# Patient Record
Sex: Female | Born: 1950
Health system: Southern US, Community
[De-identification: ages and names within clinical notes are randomized; demographics above are authoritative.]

## PROBLEM LIST (undated history)

## (undated) DIAGNOSIS — E039 Hypothyroidism, unspecified: Secondary | ICD-10-CM

## (undated) DIAGNOSIS — E785 Hyperlipidemia, unspecified: Secondary | ICD-10-CM

## (undated) DIAGNOSIS — K760 Fatty (change of) liver, not elsewhere classified: Secondary | ICD-10-CM

## (undated) DIAGNOSIS — I10 Essential (primary) hypertension: Secondary | ICD-10-CM

## (undated) DIAGNOSIS — Z780 Asymptomatic menopausal state: Secondary | ICD-10-CM

## (undated) DIAGNOSIS — M8580 Other specified disorders of bone density and structure, unspecified site: Secondary | ICD-10-CM

## (undated) DIAGNOSIS — K5790 Diverticulosis of intestine, part unspecified, without perforation or abscess without bleeding: Secondary | ICD-10-CM

## (undated) DIAGNOSIS — K635 Polyp of colon: Secondary | ICD-10-CM

## (undated) DIAGNOSIS — N6009 Solitary cyst of unspecified breast: Secondary | ICD-10-CM

## (undated) DIAGNOSIS — M1990 Unspecified osteoarthritis, unspecified site: Secondary | ICD-10-CM

## (undated) HISTORY — PX: COLONOSCOPY: SHX174

## (undated) HISTORY — DX: Solitary cyst of unspecified breast: N60.09

## (undated) HISTORY — DX: Hypothyroidism, unspecified: E03.9

## (undated) HISTORY — DX: Unspecified osteoarthritis, unspecified site: M19.90

## (undated) HISTORY — DX: Hyperlipidemia, unspecified: E78.5

## (undated) HISTORY — DX: Fatty (change of) liver, not elsewhere classified: K76.0

## (undated) HISTORY — DX: Asymptomatic menopausal state: Z78.0

## (undated) HISTORY — DX: Diverticulosis of intestine, part unspecified, without perforation or abscess without bleeding: K57.90

## (undated) HISTORY — DX: Polyp of colon: K63.5

## (undated) HISTORY — DX: Essential (primary) hypertension: I10

## (undated) HISTORY — DX: Other specified disorders of bone density and structure, unspecified site: M85.80

## (undated) HISTORY — PX: BREAST BIOPSY: SHX20

---

## 2000-04-11 DIAGNOSIS — Z780 Asymptomatic menopausal state: Secondary | ICD-10-CM

## 2000-04-11 HISTORY — DX: Asymptomatic menopausal state: Z78.0

## 2003-10-07 ENCOUNTER — Ambulatory Visit (HOSPITAL_COMMUNITY): Admission: RE | Admit: 2003-10-07 | Discharge: 2003-10-07 | Payer: Self-pay | Admitting: Internal Medicine

## 2003-10-07 ENCOUNTER — Encounter (INDEPENDENT_AMBULATORY_CARE_PROVIDER_SITE_OTHER): Payer: Self-pay | Admitting: Specialist

## 2004-10-07 ENCOUNTER — Other Ambulatory Visit: Admission: RE | Admit: 2004-10-07 | Discharge: 2004-10-07 | Payer: Self-pay | Admitting: Radiology

## 2004-11-16 ENCOUNTER — Ambulatory Visit (HOSPITAL_BASED_OUTPATIENT_CLINIC_OR_DEPARTMENT_OTHER): Admission: RE | Admit: 2004-11-16 | Discharge: 2004-11-16 | Payer: Self-pay | Admitting: General Surgery

## 2004-11-16 ENCOUNTER — Ambulatory Visit (HOSPITAL_COMMUNITY): Admission: RE | Admit: 2004-11-16 | Discharge: 2004-11-16 | Payer: Self-pay | Admitting: General Surgery

## 2004-11-16 ENCOUNTER — Encounter (INDEPENDENT_AMBULATORY_CARE_PROVIDER_SITE_OTHER): Payer: Self-pay | Admitting: *Deleted

## 2005-07-19 ENCOUNTER — Encounter: Admission: RE | Admit: 2005-07-19 | Discharge: 2005-07-19 | Payer: Self-pay | Admitting: Family Medicine

## 2005-09-27 ENCOUNTER — Ambulatory Visit: Payer: Self-pay | Admitting: Family Medicine

## 2006-01-03 ENCOUNTER — Ambulatory Visit: Payer: Self-pay | Admitting: Family Medicine

## 2006-02-28 ENCOUNTER — Ambulatory Visit: Payer: Self-pay | Admitting: Family Medicine

## 2006-08-29 ENCOUNTER — Ambulatory Visit: Payer: Self-pay | Admitting: Family Medicine

## 2007-05-04 ENCOUNTER — Ambulatory Visit: Payer: Self-pay | Admitting: Family Medicine

## 2007-05-04 ENCOUNTER — Other Ambulatory Visit: Admission: RE | Admit: 2007-05-04 | Discharge: 2007-05-04 | Payer: Self-pay | Admitting: Family Medicine

## 2007-06-19 ENCOUNTER — Ambulatory Visit: Payer: Self-pay | Admitting: Family Medicine

## 2007-09-07 ENCOUNTER — Ambulatory Visit: Payer: Self-pay | Admitting: Family Medicine

## 2008-01-09 ENCOUNTER — Ambulatory Visit: Payer: Self-pay | Admitting: Family Medicine

## 2008-01-17 ENCOUNTER — Ambulatory Visit: Payer: Self-pay | Admitting: Family Medicine

## 2008-05-06 ENCOUNTER — Ambulatory Visit: Payer: Self-pay | Admitting: Family Medicine

## 2008-10-06 LAB — HM COLONOSCOPY

## 2009-01-06 ENCOUNTER — Ambulatory Visit: Payer: Self-pay | Admitting: Family Medicine

## 2009-03-24 ENCOUNTER — Ambulatory Visit: Payer: Self-pay | Admitting: Family Medicine

## 2009-05-25 LAB — HM DEXA SCAN

## 2009-06-01 ENCOUNTER — Other Ambulatory Visit: Admission: RE | Admit: 2009-06-01 | Discharge: 2009-06-01 | Payer: Self-pay | Admitting: Family Medicine

## 2009-06-02 ENCOUNTER — Ambulatory Visit: Payer: Self-pay | Admitting: Family Medicine

## 2009-12-07 ENCOUNTER — Ambulatory Visit: Payer: Self-pay | Admitting: Physician Assistant

## 2010-02-23 ENCOUNTER — Ambulatory Visit: Payer: Self-pay | Admitting: Family Medicine

## 2010-06-09 ENCOUNTER — Encounter (INDEPENDENT_AMBULATORY_CARE_PROVIDER_SITE_OTHER): Payer: PRIVATE HEALTH INSURANCE | Admitting: Family Medicine

## 2010-06-09 DIAGNOSIS — I10 Essential (primary) hypertension: Secondary | ICD-10-CM

## 2010-06-09 DIAGNOSIS — E7800 Pure hypercholesterolemia, unspecified: Secondary | ICD-10-CM

## 2010-06-09 DIAGNOSIS — E039 Hypothyroidism, unspecified: Secondary | ICD-10-CM

## 2010-06-09 DIAGNOSIS — E119 Type 2 diabetes mellitus without complications: Secondary | ICD-10-CM

## 2010-08-27 NOTE — Op Note (Signed)
Sierra Combs, Sierra Combs                ACCOUNT NO.:  [id number]   MEDICAL RECORD NO.:  [id number]          PATIENT TYPE:  AMB   LOCATION:  DSC                          FACILITY:  [facility]   PHYSICIAN:  Rose Phi. Maple Hudson, M.D.   DATE OF BIRTH:  Jul 08, 1950   DATE OF PROCEDURE:  11/16/2004  DATE OF DISCHARGE:                                 OPERATIVE REPORT   PREOPERATIVE DIAGNOSIS:  Left breast mass.   POSTOPERATIVE DIAGNOSIS:  Left breast mass.   OPERATION:  Excision of same.   SURGEON:  Rose Phi. Maple Hudson, M.D.   ANESTHESIA:  MAC.   OPERATIVE PROCEDURE:  The patient was placed on the operating table with the  arms extended on the arm board and the left breast prepped and draped in the  usual fashion.  The palpable mass measuring about 2 cm in diameter was at  the 3 o'clock position near the areolar margin.  A curved incision was then  outlined over the palpable mass and the area infiltrated with a local  anesthetic.  Incision was made and a traction suture of 2-0 silk placed in  the mass itself and then it was excised.  Hemostasis was obtained with the  cautery.   Subcuticular closure of 4-0 Monocryl and Steri-Strips was carried out.  Dressing applied.  The patient then transferred to the recovery room in  satisfactory condition, having tolerated the procedure well.       PRY/MEDQ  D:  11/16/2004  T:  11/17/2004  Job:  [id number]

## 2010-08-27 NOTE — Op Note (Signed)
NAME:  Sierra Combs, Sierra Combs                          ACCOUNT NO.:  [id number]   MEDICAL RECORD NO.:  [id number]                   PATIENT TYPE:  AMB   LOCATION:  ENDO                                 FACILITY:  [facility]   PHYSICIAN:  Graylin Shiver, M.D.                DATE OF BIRTH:  [birth year]   DATE OF PROCEDURE:  10/07/2003  DATE OF DISCHARGE:                                 OPERATIVE REPORT   PROCEDURE:  Colonoscopy with biopsy.   INDICATIONS FOR PROCEDURE:  Screening.   Informed consent was obtained after explanation of the risks of bleeding,  infection and perforation.   PREMEDICATION:  Fentanyl 62.5 mcg IV, Versed 5 mg IV.   DESCRIPTION OF PROCEDURE:  With the patient in the left lateral decubitus  position, a rectal exam was performed and no masses were felt. The Olympus  colonoscope was inserted into the rectum and advanced around the colon to  the cecum. Cecal landmarks were identified. The cecum was normal. In the  proximal ascending colon, there was a 3 mm sessile polyp removed with cold  forceps. The transverse colon was normal. Of note is that there were a few  diverticula noted in the ascending colon region. The descending colon and  sigmoid revealed a few diverticula.  In the sigmoid, there was a small 3 mm  polyp removed with cold forceps. The rectum was normal. She tolerated the  procedure well without complications.   IMPRESSION:  1. Colon polyps, diagnosis code 211.3.  2. Scattered diverticula in the colon.   PLAN:  The pathology will be checked.                                               Graylin Shiver, M.D.    Germain Osgood  D:  10/07/2003  T:  10/07/2003  Job:  [id number]   cc:   Sharlot Gowda, M.D.  [address]  [place], [place] [address]  Fax: [phone]

## 2010-09-03 ENCOUNTER — Encounter: Payer: Self-pay | Admitting: *Deleted

## 2010-09-09 ENCOUNTER — Ambulatory Visit (INDEPENDENT_AMBULATORY_CARE_PROVIDER_SITE_OTHER): Payer: PRIVATE HEALTH INSURANCE | Admitting: Family Medicine

## 2010-09-09 ENCOUNTER — Encounter: Payer: Self-pay | Admitting: Family Medicine

## 2010-09-09 VITALS — BP 110/68 | HR 72 | Ht 64.0 in | Wt 122.0 lb

## 2010-09-09 DIAGNOSIS — I10 Essential (primary) hypertension: Secondary | ICD-10-CM | POA: Insufficient documentation

## 2010-09-09 DIAGNOSIS — E7800 Pure hypercholesterolemia, unspecified: Secondary | ICD-10-CM

## 2010-09-09 DIAGNOSIS — E119 Type 2 diabetes mellitus without complications: Secondary | ICD-10-CM | POA: Insufficient documentation

## 2010-09-09 DIAGNOSIS — E039 Hypothyroidism, unspecified: Secondary | ICD-10-CM

## 2010-09-09 HISTORY — DX: Hypothyroidism, unspecified: E03.9

## 2010-09-09 LAB — LIPID PANEL
Cholesterol: 157 mg/dL (ref 0–200)
LDL Cholesterol: 88 mg/dL (ref 0–99)
Total CHOL/HDL Ratio: 2.6 Ratio
Triglycerides: 41 mg/dL (ref <150)
VLDL: 8 mg/dL (ref 0–40)

## 2010-09-09 LAB — HEPATIC FUNCTION PANEL
ALT: 13 U/L (ref 0–35)
AST: 22 U/L (ref 0–37)
Albumin: 4 g/dL (ref 3.5–5.2)
Bilirubin, Direct: 0.1 mg/dL (ref 0.0–0.3)

## 2010-09-09 LAB — POCT GLYCOSYLATED HEMOGLOBIN (HGB A1C): Hemoglobin A1C: 6

## 2010-09-09 MED ORDER — LEVOTHYROXINE SODIUM 100 MCG PO TABS: 100.0000 ug | ORAL_TABLET | Freq: Every day | ORAL | Status: DC

## 2010-09-09 MED ORDER — ATORVASTATIN CALCIUM 80 MG PO TABS: 80.0000 mg | ORAL_TABLET | Freq: Every day | ORAL | Status: DC

## 2010-09-09 MED ORDER — NIACIN ER (ANTIHYPERLIPIDEMIC) 1000 MG PO TBCR: 1000.0000 mg | EXTENDED_RELEASE_TABLET | Freq: Every day | ORAL | Status: DC

## 2010-09-09 MED ORDER — METFORMIN HCL 1000 MG PO TABS: 1000.0000 mg | ORAL_TABLET | Freq: Two times a day (BID) | ORAL | Status: DC

## 2010-09-09 MED ORDER — LISINOPRIL 5 MG PO TABS: 5.0000 mg | ORAL_TABLET | Freq: Every day | ORAL | Status: DC

## 2010-09-09 NOTE — Progress Notes (Signed)
Addended by: Joselyn Arrow on: 09/09/2010 09:33 AM   Modules accepted: Orders

## 2010-09-09 NOTE — Progress Notes (Signed)
Subjective:    Patient ID: Sierra Combs, female    DOB: 06-08-1950, 60 y.o.   MRN: [id number]  HPI Diabetes follow-up:  A1c 3 months ago was 6.2.  Blood sugars at home are running <100 in the morning, 125-130 in the evenings.  Denies hypoglycemia.  Denies polydipsia and polyuria.  Last eye exam was 12/2009.  Patient follows a low sugar diet and checks feet regularly without concerns.  Hypertension follow-up:  Blood pressures are not checked elsewhere.  Denies dizziness, headaches, chest pain.  Denies side effects of medications.  Hyperlipidemia follow-up:  Patient is reportedly following a low-fat, low cholesterol diet.  Compliant with medications and denies medication side effects. Last LDL was 80 in November, due for re-check today.  Hypothyroidism follow-up:  Last TSH was 3 months ago and was normal.  Osteopenia:  Last DEXA 05/2009.  Has been on Fosamax for years ('08?). Denies dysphagia or other side effects of medication  Past Medical History  Diagnosis Date  . Hyperlipidemia small particles 6/06  . Breast cyst 3cm L breast  . Colonic polyp   . Diverticulosis   . Menopause 2002  . Osteopenia L hip  . Fatty liver   . Diabetes mellitus   . Hypertension   . Unspecified hypothyroidism 09/09/2010    Past Surgical History  Procedure Date  . Colonoscopy 6/05  . Breast biopsy 7/06 L breast benign    History   Social History  . Marital Status: Married    Spouse Name: N/A    Number of Children: 1  . 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 Smoking status: Never Smoker   . Smokeless tobacco: Never Used  . Alcohol Use: No  . Drug Use: No  . Sexually Active: Not on file   Other Topics Concern  . Not on file   Social History Narrative  . No narrative on file    Family History  Problem Relation Age of Onset  . Diabetes Mother   . Heart disease Mother   . Hyperlipidemia  Mother   . Hypertension Mother   . Diabetes Father   . Heart disease Father   . Hyperlipidemia Father   . Hypertension Father   . Diabetes Sister   . Diabetes Brother     legs amputated  . Cancer Neg Hx   . Diabetes Sister     Current outpatient prescriptions:alendronate (FOSAMAX) 70 MG tablet, Take 70 mg by mouth every 7 (seven) days. Take with a full glass of water on an empty stomach. , Disp: , Rfl: ;  aspirin 81 MG tablet, Take 81 mg by mouth daily.  , Disp: , Rfl: ;  atorvastatin (LIPITOR) 80 MG tablet, Take 1 tablet (80 mg total) by mouth daily., Disp: 30 tablet, Rfl: 5 Calcium Carbonate-Vitamin D (CALCIUM 600 + D PO), Take 1 tablet by mouth 2 (two) times daily.  , Disp: , Rfl: ;  fish oil-omega-3 fatty acids 1000 MG capsule, Take 1 g by mouth daily. , Disp: , Rfl: ;  levothyroxine (SYNTHROID, LEVOTHROID) 100 MCG tablet, Take 1 tablet (100 mcg total) by mouth daily., Disp: 30 tablet, Rfl: 5;  lisinopril (PRINIVIL,ZESTRIL) 5 MG tablet, Take 1 tablet (5 mg total) by mouth daily., Disp: 30 tablet, Rfl: 5 metFORMIN (GLUCOPHAGE) 1000 MG tablet, Take 1 tablet (1,000 mg total) by mouth 2 (two) times daily with a meal., Disp: 60 tablet, Rfl:  5;  niacin (NIASPAN) 1000 MG CR tablet, Take 1 tablet (1,000 mg total) by mouth at bedtime., Disp: 30 tablet, Rfl: 5;  DISCONTD: atorvastatin (LIPITOR) 80 MG tablet, Take 80 mg by mouth daily.  , Disp: , Rfl: ;  DISCONTD: calcium-vitamin D 250-100 MG-UNIT per tablet, Take 1 tablet by mouth daily. , Disp: , Rfl:  DISCONTD: levothyroxine (SYNTHROID, LEVOTHROID) 100 MCG tablet, Take 100 mcg by mouth daily.  , Disp: , Rfl: ;  DISCONTD: lisinopril (PRINIVIL,ZESTRIL) 5 MG tablet, Take 5 mg by mouth daily.  , Disp: , Rfl: ;  DISCONTD: metFORMIN (GLUCOPHAGE) 1000 MG tablet, Take 1,000 mg by mouth 2 (two) times daily with a meal.  , Disp: , Rfl: ;  DISCONTD: niacin (NIASPAN) 1000 MG CR tablet, Take 1,000 mg by mouth at bedtime.  , Disp: , Rfl:   No Known  Allergies  Review of Systems Denies headaches, chest pain, palpitations, dizziness, nausea, change in bowels, urinary complaints, skin rashes or other concerns    Objective:   Physical Exam  Well developed, well nourished patient, in no distress BP 110/68  Pulse 72  Ht 5\' 4"  (1.626 m)  Wt 122 lb (55.339 kg)  BMI 20.94 kg/m2 Neck: No lymphadenopathy or thyromegaly, no carotid bruit Heart:  Regular rate and rhythm, no murmurs, rubs, gallops or ectopy Lungs:  Clear bilaterally, without wheezes, rales or ronchi Abdomen:  Soft, nontender, nondistended, no hepatosplenomegaly or masses, normal bowel sounds Extremities:  No clubbing, cyanosis or edema, 2+ DP pulses, 1+ PT.  Neuro:  Alert and oriented x 3, cranial nerves grossly intact.  DTR's 2+ and symmetric.  Normal strength and sensation Back:  No spine or CVA tenderness Skin: no rashes or suspicious lesions Psych:  Normal mood, affect, hygiene and grooming, normal speech, eye contact      Assessment & Plan:   1. Type II or unspecified type diabetes mellitus without mention of complication, not stated as uncontrolled  POCT HgB A1C, Hemoglobin A1c, metFORMIN (GLUCOPHAGE) 1000 MG tablet  2. Unspecified hypothyroidism  levothyroxine (SYNTHROID, LEVOTHROID) 100 MCG tablet  3. Essential hypertension, benign  lisinopril (PRINIVIL,ZESTRIL) 5 MG tablet  4. Pure hypercholesterolemia  Lipid panel, Hepatic function panel, atorvastatin (LIPITOR) 80 MG tablet, niacin (NIASPAN) 1000 MG CR tablet  DEXA due 05/2010--will order at f/u visit in 6 months. F/U in 6 months, sooner if sugars or BP abnormal, or if develops thyroid-related symptoms  [employer]ayer Contour meter, box of 50 test strips and savings card given to patient.

## 2010-09-15 ENCOUNTER — Telehealth: Payer: Self-pay | Admitting: *Deleted

## 2010-09-15 NOTE — Telephone Encounter (Signed)
Message copied by Melonie Florida on Wed Sep 15, 2010 12:59 PM ------      Message from: KNAPP, EVE      Created: Mon Sep 13, 2010  5:19 PM       DM is well controlled.  LFT's are normal.  Goal LDL <70, last time was 80.  Currently, LDL is 88.  Continue Lipitor 80mg  and Niaspan, re-check lipids in 6 months.  Follow low cholesterol diet.  If LDL continues to rise, will need to change from Lipitor to Crestor

## 2010-09-15 NOTE — Telephone Encounter (Signed)
Left message giving patient lab results. Instructed her to call with any questions.

## 2010-09-20 ENCOUNTER — Telehealth: Payer: Self-pay | Admitting: *Deleted

## 2010-09-20 NOTE — Telephone Encounter (Signed)
Patient given lab results. She will work on low chol diet and sch lab visit for 6 months for fasting lipids.vs

## 2011-03-22 ENCOUNTER — Other Ambulatory Visit: Payer: PRIVATE HEALTH INSURANCE

## 2011-03-22 DIAGNOSIS — R7989 Other specified abnormal findings of blood chemistry: Secondary | ICD-10-CM

## 2011-03-22 DIAGNOSIS — E785 Hyperlipidemia, unspecified: Secondary | ICD-10-CM

## 2011-03-22 LAB — TSH: TSH: 0.858 u[IU]/mL (ref 0.350–4.500)

## 2011-03-22 LAB — HEMOGLOBIN A1C: Mean Plasma Glucose: 137 mg/dL — ABNORMAL HIGH (ref <117)

## 2011-03-23 LAB — LIPID PANEL
Cholesterol: 134 mg/dL (ref 0–200)
HDL: 56 mg/dL (ref >39)
Triglycerides: 31 mg/dL (ref <150)

## 2011-04-14 ENCOUNTER — Other Ambulatory Visit: Payer: Self-pay | Admitting: *Deleted

## 2011-04-14 DIAGNOSIS — E7800 Pure hypercholesterolemia, unspecified: Secondary | ICD-10-CM

## 2011-04-14 MED ORDER — ATORVASTATIN CALCIUM 80 MG PO TABS: 80.0000 mg | ORAL_TABLET | Freq: Every day | ORAL | Status: DC

## 2011-05-06 ENCOUNTER — Encounter: Payer: Self-pay | Admitting: Internal Medicine

## 2011-05-11 ENCOUNTER — Ambulatory Visit (INDEPENDENT_AMBULATORY_CARE_PROVIDER_SITE_OTHER): Payer: PRIVATE HEALTH INSURANCE | Admitting: Family Medicine

## 2011-05-11 ENCOUNTER — Encounter: Payer: Self-pay | Admitting: Family Medicine

## 2011-05-11 DIAGNOSIS — M949 Disorder of cartilage, unspecified: Secondary | ICD-10-CM

## 2011-05-11 DIAGNOSIS — E7800 Pure hypercholesterolemia, unspecified: Secondary | ICD-10-CM

## 2011-05-11 DIAGNOSIS — E119 Type 2 diabetes mellitus without complications: Secondary | ICD-10-CM

## 2011-05-11 DIAGNOSIS — M8580 Other specified disorders of bone density and structure, unspecified site: Secondary | ICD-10-CM

## 2011-05-11 DIAGNOSIS — I10 Essential (primary) hypertension: Secondary | ICD-10-CM

## 2011-05-11 DIAGNOSIS — R634 Abnormal weight loss: Secondary | ICD-10-CM

## 2011-05-11 DIAGNOSIS — E039 Hypothyroidism, unspecified: Secondary | ICD-10-CM

## 2011-05-11 MED ORDER — ATORVASTATIN CALCIUM 80 MG PO TABS: 80.0000 mg | ORAL_TABLET | Freq: Every day | ORAL | Status: DC

## 2011-05-11 MED ORDER — METFORMIN HCL 1000 MG PO TABS: 1000.0000 mg | ORAL_TABLET | Freq: Two times a day (BID) | ORAL | Status: DC

## 2011-05-11 MED ORDER — ALENDRONATE SODIUM 70 MG PO TABS: 70.0000 mg | ORAL_TABLET | ORAL | Status: DC

## 2011-05-11 MED ORDER — LEVOTHYROXINE SODIUM 100 MCG PO TABS: 100.0000 ug | ORAL_TABLET | Freq: Every day | ORAL | Status: DC

## 2011-05-11 MED ORDER — NIACIN ER (ANTIHYPERLIPIDEMIC) 1000 MG PO TBCR: 1000.0000 mg | EXTENDED_RELEASE_TABLET | Freq: Every day | ORAL | Status: DC

## 2011-05-11 MED ORDER — LISINOPRIL 5 MG PO TABS: 5.0000 mg | ORAL_TABLET | Freq: Every day | ORAL | Status: DC

## 2011-05-11 NOTE — Progress Notes (Signed)
Chief complaint:  6 month med check.  Had labs done in December.  Diabetes follow-up:  Blood sugars at home are running <100 in the morning, usually 80-90.  Denies hypoglycemia. Denies polydipsia and polyuria. Last eye exam was 03/2011, due to follow up in February for a cataract re-check. Patient follows a low sugar diet and checks feet regularly without concerns.   Hypertension follow-up: Blood pressures are not checked elsewhere. Denies dizziness, headaches, chest pain. Denies side effects of medications.   Hyperlipidemia follow-up: Patient is reportedly following a low-fat, low cholesterol diet. Compliant with medications and denies medication side effects.   She was noted to have lost 8 pounds since her last visit.  She thinks sometimes she just doesn't eat enough.  She eats 2 meals/day, with sometimes a third meal or a snack.  ROS:  R knee pain from arthritis.  Denies headaches, dizziness, chest pain, palpitations, fevers, URI symptoms, cough, shortness of breath, nausea, vomiting, diarrhea, bowel changes, skin rashes/lesions or other concerns  PHYSICAL EXAM: BP 118/84  Pulse 68  Ht 5\' 4"  (1.626 m)  Wt 114 lb (51.71 kg)  BMI 19.57 kg/m2 Well developed, pleasant, thin female in no distress HEENT: PERRL, EOMI, conjunctiva clear.  OP clear Neck: No lymphadenopathy or thyromegaly, no carotid bruit  Heart: Regular rate and rhythm, no murmurs, rubs, gallops or ectopy  Lungs: Clear bilaterally, without wheezes, rales or ronchi  Abdomen: Soft, nontender, nondistended, no hepatosplenomegaly or masses, normal bowel sounds  Extremities: No clubbing, cyanosis or edema, 2+ DP pulses, 1+ PT.  Neuro: Alert and oriented x 3, cranial nerves grossly intact. DTR's 2+ and symmetric. Normal strength and sensation  Back: No spine or CVA tenderness  Skin: no rashes or suspicious lesions  Psych: Normal mood, affect, hygiene and grooming, normal speech, eye contact Extremities: normal diabetic foot  exam--see other section  Lab Results  Component Value Date   HGBA1C 6.4* 03/22/2011   Lab Results  Component Value Date   ALT 13 09/09/2010   AST 22 09/09/2010   ALKPHOS 48 09/09/2010   BILITOT 0.4 09/09/2010   Lab Results  Component Value Date   TSH 0.858 03/22/2011   Lab Results  Component Value Date   CHOL 134 03/22/2011   HDL 56 03/22/2011   LDLCALC 72 03/22/2011   TRIG 31 03/22/2011   CHOLHDL 2.4 03/22/2011   ASSESSMENT/PLAN: 1. Essential hypertension, benign  lisinopril (PRINIVIL,ZESTRIL) 5 MG tablet  2. Pure hypercholesterolemia  niacin (NIASPAN) 1000 MG CR tablet, atorvastatin (LIPITOR) 80 MG tablet  3. Type II or unspecified type diabetes mellitus without mention of complication, not stated as uncontrolled  metFORMIN (GLUCOPHAGE) 1000 MG tablet  4. Unspecified hypothyroidism  levothyroxine (SYNTHROID, LEVOTHROID) 100 MCG tablet  5. Weight loss    6. Osteopenia  alendronate (FOSAMAX) 70 MG tablet   HTN, DM and lipids are controlled.  Weight loss--discussed differential diagnosis, including inadequate caloric intake, related to thyroid, possible underlying condition such as cancer.  Due for DEXA at [facility], and mammogram.  Encouraged mammograms yearly (last was 05/2009). Colonoscopy UTD 09/2008 Last pap 04/2007--due again now.  F/u 2-3 months for CPE/pap--will f/u on weight, and re-check TSH at that visit (nonfasting). Discuss Zostavax at CPE

## 2011-05-11 NOTE — Patient Instructions (Signed)
Schedule your mammogram (please get these yearly) You will be due for Bone density in February  Please try and eat more regularly--three meals a day, plus snacks  Pap smears are recommended every 2-3 years.  Schedule your appointment for 2-3 months, and at that time we will also recheck your weight and thyroid blood test.

## 2011-07-07 ENCOUNTER — Ambulatory Visit (INDEPENDENT_AMBULATORY_CARE_PROVIDER_SITE_OTHER): Payer: PRIVATE HEALTH INSURANCE | Admitting: Family Medicine

## 2011-07-07 ENCOUNTER — Other Ambulatory Visit (HOSPITAL_COMMUNITY)
Admission: RE | Admit: 2011-07-07 | Discharge: 2011-07-07 | Disposition: A | Discharge status: Home / Self Care | Payer: PRIVATE HEALTH INSURANCE | Source: Ambulatory Visit | Attending: Family Medicine | Admitting: Family Medicine

## 2011-07-07 ENCOUNTER — Encounter: Payer: Self-pay | Admitting: Family Medicine

## 2011-07-07 VITALS — BP 120/76 | HR 72 | Ht 64.0 in | Wt 115.0 lb

## 2011-07-07 DIAGNOSIS — Z0000 Encounter for general adult medical examination without abnormal findings: Secondary | ICD-10-CM

## 2011-07-07 DIAGNOSIS — R195 Other fecal abnormalities: Secondary | ICD-10-CM

## 2011-07-07 DIAGNOSIS — Z01419 Encounter for gynecological examination (general) (routine) without abnormal findings: Secondary | ICD-10-CM | POA: Insufficient documentation

## 2011-07-07 DIAGNOSIS — E039 Hypothyroidism, unspecified: Secondary | ICD-10-CM

## 2011-07-07 LAB — CBC WITH DIFFERENTIAL/PLATELET
Eosinophils Relative: 1 % (ref 0–5)
HCT: 39.3 % (ref 36.0–46.0)
Lymphocytes Relative: 36 % (ref 12–46)
Lymphs Abs: 2.8 10*3/uL (ref 0.7–4.0)
MCH: 29 pg (ref 26.0–34.0)
MCHC: 31.6 g/dL (ref 30.0–36.0)
MCV: 91.8 fL (ref 78.0–100.0)
Monocytes Absolute: 0.3 10*3/uL (ref 0.1–1.0)
Monocytes Relative: 4 % (ref 3–12)
Neutrophils Relative %: 58 % (ref 43–77)
Platelets: 343 10*3/uL (ref 150–400)
WBC: 7.8 10*3/uL (ref 4.0–10.5)

## 2011-07-07 LAB — HM PAP SMEAR: HM Pap smear: NORMAL

## 2011-07-07 NOTE — Progress Notes (Signed)
Sierra Combs is a 61 y.o. female who presents for a complete physical.  She has the following concerns:  She had recent med check, where weight loss was noted.  She is here for CPE/pap, and to follow up on her weight and have TSH re-checked as routine f/u on hypothyroidism.  She denies any thyroid symptoms.  Health Maintenance: Immunization History  Administered Date(s) Administered  . DTaP 05/04/2007  . Pneumococcal Conjugate 05/04/2007  . Td 06/17/1997  Gets flu shots yearly at work Last Pap smear: 1/09 Last mammogram: 05/2011 Last colonoscopy: 09/2008 Last DEXA: 05/2009 Dentist: a few years ago, plans to go Ophtho: UTD--having laser surgery next week Exercise:  Walks 15-20 minutes daily, plus does 4 flights of stairs up and down (15 minutes)  Past Medical History  Diagnosis Date  . Hyperlipidemia small particles 6/06  . Breast cyst 3cm L breast  . Colonic polyp   . Diverticulosis   . Menopause 2002  . Osteopenia L hip  . Fatty liver   . Diabetes mellitus   . Hypertension   . Unspecified hypothyroidism 09/09/2010    Past Surgical History  Procedure Date  . Colonoscopy 6/05  . Breast biopsy 7/06 L breast benign  . Cesarean section     History   Social History  . Marital Status: Married    Spouse Name: N/A    Number of Children: 1  . 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 Smoking status: Never Smoker   . Smokeless tobacco: Never Used  . Alcohol Use: No  . Drug Use: No  . Sexually Active: Yes -- Female partner(s)   Other Topics Concern  . Not on file   Social History Narrative  . No narrative on file    Family History  Problem Relation Age of Onset  . Diabetes Mother   . Heart disease Mother   . Hyperlipidemia Mother   . Hypertension Mother   . Diabetes Father   . Heart disease Father   . Hyperlipidemia Father   . Hypertension Father   . Diabetes Sister    . Diabetes Brother     legs amputated  . Cancer Neg Hx   . Diabetes Sister    Current Outpatient Prescriptions on File Prior to Visit  Medication Sig Dispense Refill  . alendronate (FOSAMAX) 70 MG tablet Take 1 tablet (70 mg total) by mouth every 7 (seven) days. Take with a full glass of water on an empty stomach.  4 tablet  5  . aspirin 81 MG tablet Take 81 mg by mouth daily.        Marland Kitchen atorvastatin (LIPITOR) 80 MG tablet Take 1 tablet (80 mg total) by mouth daily.  30 tablet  5  . Calcium Carbonate-Vitamin D (CALCIUM 600 + D PO) Take 1 tablet by mouth 2 (two) times daily.        . fish oil-omega-3 fatty acids 1000 MG capsule Take 1 g by mouth daily.       Marland Kitchen levothyroxine (SYNTHROID, LEVOTHROID) 100 MCG tablet Take 1 tablet (100 mcg total) by mouth daily.  30 tablet  5  . lisinopril (PRINIVIL,ZESTRIL) 5 MG tablet Take 1 tablet (5 mg total) by mouth daily.  30 tablet  5  . metFORMIN (GLUCOPHAGE) 1000 MG tablet Take 1 tablet (1,000 mg total) by mouth 2 (two) times daily with a meal.  60 tablet  5  .  niacin (NIASPAN) 1000 MG CR tablet Take 1 tablet (1,000 mg total) by mouth at bedtime.  30 tablet  5   No Known Allergies  ROS:  The patient denies anorexia, fever, weight changes, headaches,  vision changes, decreased hearing, ear pain, sore throat, breast concerns, chest pain, palpitations, dizziness, syncope, dyspnea on exertion, cough, swelling, nausea, vomiting, diarrhea, constipation, abdominal pain, melena, hematochezia, indigestion/heartburn, hematuria, incontinence, dysuria, vaginal bleeding, discharge, odor or itch, genital lesions, joint pains, numbness, tingling, weakness, tremor, suspicious skin lesions, depression, anxiety, abnormal bleeding/bruising, or enlarged lymph nodes. +mild allergies/congestion  PHYSICAL EXAM: BP 120/76  Pulse 72  Ht 5\' 4"  (1.626 m)  Wt 115 lb (52.164 kg)  BMI 19.74 kg/m2  General Appearance:    Alert, cooperative, no distress, appears stated age  Head:     Normocephalic, without obvious abnormality, atraumatic  Eyes:    PERRL, conjunctiva/corneas clear, EOM's intact, fundi    benign  Ears:    Normal TM's and external ear canals  Nose:   Nares normal, mucosa normal, no drainage or sinus   tenderness  Throat:   Lips, mucosa, and tongue normal; teeth and gums normal  Neck:   Supple, no lymphadenopathy;  thyroid:  no   enlargement/tenderness/nodules; no carotid   bruit or JVD  Back:    Spine nontender, no curvature, ROM normal, no CVA     tenderness  Lungs:     Clear to auscultation bilaterally without wheezes, rales or     ronchi; respirations unlabored  Chest Wall:    No tenderness or deformity   Heart:    Regular rate and rhythm, S1 and S2 normal, no murmur, rub   or gallop  Breast Exam:    No tenderness, masses, or nipple discharge or inversion.      No axillary lymphadenopathy. Some increased fibroglandularity on LUOQ compared to R.  Nontender, no discrete mass.  Abdomen:     Soft, non-tender, nondistended, normoactive bowel sounds,    no masses, no hepatosplenomegaly  Genitalia:    Normal external genitalia without lesions.  BUS and vagina normal; cervix nulliparous, without lesions, or cervical motion tenderness. Stenotic cervical os. No abnormal vaginal discharge.  Uterus and adnexa not enlarged, nontender, no masses.  Pap performed  Rectal:    Normal tone, no masses or tenderness; trace positive guaiac stool  Extremities:   No clubbing, cyanosis or edema  Pulses:   2+ and symmetric all extremities  Skin:   Skin color, texture, turgor normal, no rashes or lesions  Lymph nodes:   Cervical, supraclavicular, and axillary nodes normal  Neurologic:   CNII-XII intact, normal strength, sensation and gait; reflexes 2+ and symmetric throughout          Psych:   Normal mood, affect, hygiene and grooming.    ASSESSMENT/PLAN: 1. Routine general medical examination at a health care facility  Visual acuity screening, Cytology - PAP  2. Unspecified  hypothyroidism  TSH  3. Heme positive stool  CBC with Differential   Osteopenia--due for DEXA at [facility]. Rx given, patient to schedule  Discussed monthly self breast exams and yearly mammograms; at least 30 minutes of aerobic activity at least 5 days/week; proper sunscreen use reviewed; healthy diet, including goals of calcium and vitamin D intake and alcohol recommendations (less than or equal to 1 drink/day) reviewed; regular seatbelt use; changing batteries in smoke detectors.  Immunization recommendations discussed--UTD, recommended zostavax.  Colonoscopy recommendations reviewed--UTD   zostavax recommended--risks/side effects reviewed.  Pt to check insurance  coverage and either schedule nurse visit or give at next med check if desired.

## 2011-07-07 NOTE — Patient Instructions (Addendum)
HEALTH MAINTENANCE RECOMMENDATIONS:  It is recommended that you get at least 30 minutes of aerobic exercise at least 5 days/week (for weight loss, you may need as much as 60-90 minutes). This can be any activity that gets your heart rate up. This can be divided in 10-15 minute intervals if needed, but try and build up your endurance at least once a week.  Weight bearing exercise is also recommended twice weekly.  Eat a healthy diet with lots of vegetables, fruits and fiber.  "Colorful" foods have a lot of vitamins (ie green vegetables, tomatoes, red peppers, etc).  Limit sweet tea, regular sodas and alcoholic beverages, all of which has a lot of calories and sugar.  Up to 1 alcoholic drink daily may be beneficial for women (unless trying to lose weight, watch sugars).  Drink a lot of water.  Calcium recommendations are 1200-1500 mg daily (1500 mg for postmenopausal women or women without ovaries), and vitamin D 1000 IU daily.  This should be obtained from diet and/or supplements (vitamins), and calcium should not be taken all at once, but in divided doses.  Monthly self breast exams and yearly mammograms for women over the age of 85 is recommended.  Sunscreen of at least SPF 30 should be used on all sun-exposed parts of the skin when outside between the hours of 10 am and 4 pm (not just when at beach or pool, but even with exercise, golf, tennis, and yard work!)  Use a sunscreen that says "broad spectrum" so it covers both UVA and UVB rays, and make sure to reapply every 1-2 hours.  Remember to change the batteries in your smoke detectors when changing your clock times in the spring and fall.  Use your seat belt every time you are in a car, and please drive safely and not be distracted with cell phones and texting while driving.  Please check with your insurance regarding shingles vaccine (Zostavax).  If it is covered and affordable to you, schedule a nurse visit (or can do at your next office  visit)  Schedule dental cleanings every 6 months

## 2011-11-01 ENCOUNTER — Other Ambulatory Visit: Payer: Self-pay | Admitting: Family Medicine

## 2011-11-03 ENCOUNTER — Other Ambulatory Visit: Payer: Self-pay | Admitting: Family Medicine

## 2011-11-17 ENCOUNTER — Ambulatory Visit (INDEPENDENT_AMBULATORY_CARE_PROVIDER_SITE_OTHER): Payer: PRIVATE HEALTH INSURANCE | Admitting: Family Medicine

## 2011-11-17 ENCOUNTER — Encounter: Payer: Self-pay | Admitting: Family Medicine

## 2011-11-17 VITALS — BP 132/82 | HR 68 | Ht 64.0 in | Wt 111.0 lb

## 2011-11-17 DIAGNOSIS — E039 Hypothyroidism, unspecified: Secondary | ICD-10-CM

## 2011-11-17 DIAGNOSIS — E119 Type 2 diabetes mellitus without complications: Secondary | ICD-10-CM

## 2011-11-17 DIAGNOSIS — I10 Essential (primary) hypertension: Secondary | ICD-10-CM

## 2011-11-17 DIAGNOSIS — R634 Abnormal weight loss: Secondary | ICD-10-CM

## 2011-11-17 DIAGNOSIS — E7800 Pure hypercholesterolemia, unspecified: Secondary | ICD-10-CM

## 2011-11-17 LAB — POCT URINALYSIS DIPSTICK
Bilirubin, UA: NEGATIVE
Blood, UA: NEGATIVE
Glucose, UA: NEGATIVE
Ketones, UA: NEGATIVE
Leukocytes, UA: NEGATIVE
Nitrite, UA: NEGATIVE
pH, UA: 5

## 2011-11-17 MED ORDER — LISINOPRIL 5 MG PO TABS: 5.0000 mg | ORAL_TABLET | Freq: Every day | ORAL | Status: DC

## 2011-11-17 MED ORDER — METFORMIN HCL 1000 MG PO TABS: 1000.0000 mg | ORAL_TABLET | Freq: Two times a day (BID) | ORAL | Status: DC

## 2011-11-17 MED ORDER — NIACIN ER (ANTIHYPERLIPIDEMIC) 1000 MG PO TBCR: 1000.0000 mg | EXTENDED_RELEASE_TABLET | Freq: Every day | ORAL | Status: DC

## 2011-11-17 MED ORDER — ATORVASTATIN CALCIUM 80 MG PO TABS: 80.0000 mg | ORAL_TABLET | Freq: Every day | ORAL | Status: DC

## 2011-11-17 NOTE — Progress Notes (Signed)
Chief Complaint  Patient presents with  . Med check    fasting med check.   HPI: Diabetes follow-up: Blood sugars at home are running <100 in the morning, usually 80-90. Denies hypoglycemia. Denies polydipsia and polyuria. Last eye exam was a few months ago, going regularly. Patient follows a low sugar diet and checks feet regularly without concerns.   Hypertension follow-up: Blood pressures are not checked elsewhere. Denies dizziness, headaches, chest pain, edema. Denies side effects of medications.   Hyperlipidemia follow-up: Patient is reportedly following a low-fat, low cholesterol diet. Compliant with medications and denies medication side effects.   Hypothyroidism: Denies fatigue, hair, skin or bowel changes.  Lost 4# since her CPE in March.  She admits that she thinks maybe isn't eating enough.  Eats 2-3 meals/day, snacks on apple or Nabs, beans. Eats smaller portions than other people. She never returned her hemoccult cards  She still has to schedule her bone density  Past Medical History  Diagnosis Date  . Hyperlipidemia small particles 6/06  . Breast cyst 3cm L breast  . Colonic polyp   . Diverticulosis   . Menopause 2002  . Osteopenia L hip  . Fatty liver   . Diabetes mellitus   . Hypertension   . Unspecified hypothyroidism 09/09/2010   Past Surgical History  Procedure Date  . Colonoscopy 6/05  . Breast biopsy 7/06 L breast benign  . Cesarean section    History   Social History  . Marital Status: Married    Spouse Name: N/A    Number of Children: 1  . 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 Smoking status: Never Smoker   . Smokeless tobacco: Never Used  . Alcohol Use: No  . Drug Use: No  . Sexually Active: Yes -- Female partner(s)   Other Topics Concern  . Not on file   Social History Narrative  . No narrative on file   Family History  Problem Relation  Age of Onset  . Diabetes Mother   . Heart disease Mother   . Hyperlipidemia Mother   . Hypertension Mother   . Diabetes Father   . Heart disease Father   . Hyperlipidemia Father   . Hypertension Father   . Diabetes Sister   . Diabetes Brother     legs amputated  . Cancer Neg Hx   . Diabetes Sister    Current Outpatient Prescriptions on File Prior to Visit  Medication Sig Dispense Refill  . alendronate (FOSAMAX) 70 MG tablet take 1 tablet by mouth every week on an empty stomach with 6-8 oz of water. No food / meds for 30 min. Remain Upright  4 tablet  5  . aspirin 81 MG tablet Take 81 mg by mouth daily.        Marland Kitchen atorvastatin (LIPITOR) 80 MG tablet take 1 tablet by mouth once daily  30 tablet  0  . Calcium Carbonate-Vitamin D (CALCIUM 600 + D PO) Take 1 tablet by mouth 2 (two) times daily.        . fish oil-omega-3 fatty acids 1000 MG capsule Take 1 g by mouth daily.       Marland Kitchen levothyroxine (SYNTHROID, LEVOTHROID) 100 MCG tablet Take 1 tablet (100 mcg total) by mouth daily.  30 tablet  5  . lisinopril (PRINIVIL,ZESTRIL) 5 MG tablet Take 1 tablet (5 mg total) by mouth daily.  30 tablet  5  .  metFORMIN (GLUCOPHAGE) 1000 MG tablet Take 1 tablet (1,000 mg total) by mouth 2 (two) times daily with a meal.  60 tablet  5  . niacin (NIASPAN) 1000 MG CR tablet Take 1 tablet (1,000 mg total) by mouth at bedtime.  30 tablet  5   No Known Allergies  ROS: denies fevers, URI symptoms, headaches, dizziness, chest pain, cough, shortness of breath, early satiety, nausea, vomiting, heartburn, bowel changes, urinary complaints, vaginal discharge or bleeding, joint pains, skin rashes or other concerns.  See HPI  PHYSICAL EXAM: BP 132/82  Pulse 68  Ht 5\' 4"  (1.626 m)  Wt 111 lb (50.349 kg)  BMI 19.05 kg/m2 Pleasant thin female in no distress HEENT: PERRL, EOMI, conjunctiva clear. OP clear  Neck: No lymphadenopathy or thyromegaly, no carotid bruit  Heart: Regular rate and rhythm, no murmurs, rubs,  gallops or ectopy  Lungs: Clear bilaterally, without wheezes, rales or ronchi  Abdomen: Soft, nontender, nondistended, no hepatosplenomegaly or masses, normal bowel sounds  Extremities: No clubbing, cyanosis or edema, 2+ DP pulses, 1+ PT.  Neuro: Alert and oriented x 3, cranial nerves grossly intact. DTR's 2+ and symmetric. Normal strength and sensation  Back: No spine or CVA tenderness  Skin: no rashes or suspicious lesions  Psych: Normal mood, affect, hygiene and grooming, normal speech, eye contact  Extremities: no edema, normal pulses  Lab Results  Component Value Date   HGBA1C 5.6 11/17/2011   ASSESSMENT/PLAN: 1. Type II or unspecified type diabetes mellitus without mention of complication, not stated as uncontrolled  HgB A1c, Comprehensive metabolic panel, Microalbumin / creatinine urine ratio, Urinalysis Dipstick, metFORMIN (GLUCOPHAGE) 1000 MG tablet  2. Essential hypertension, benign  Comprehensive metabolic panel, Urinalysis Dipstick, lisinopril (PRINIVIL,ZESTRIL) 5 MG tablet  3. Pure hypercholesterolemia  Lipid panel, Comprehensive metabolic panel, niacin (NIASPAN) 1000 MG CR tablet, atorvastatin (LIPITOR) 80 MG tablet  4. Unspecified hypothyroidism  TSH  5. Unexplained weight loss  Comprehensive metabolic panel, TSH, Urinalysis Dipstick, DG Chest 2 View   Weight loss--?etiology.  Given ongoing weight losses, will check CXR, as well as labs.  She reports having seen nutritionist in past, but that was for diabetes.  We discussed at length reasons for seeing nutritionist again now, as there may be a component of inadequate intake as cause for her weight loss, vs an underlying malignancy.   Pt was finally agreeable to see nutritionist (very hesitant at first).   Check u/a since sample never given at CPE DM--well controlled.  Due for urine microalbumin HTN--well controlled Hypothyroidism--no symptoms, recheck TSH given weight loss Hyperlipidemia--due for labs  Gets flu shots at  work  25 minute visit more than 1/2 spent counseling--diet, importance of adequate caloric intake, and other possible etiologies of weight loss, and reasons for work-up. Needed to spent a lot of time convincing her of need to see nutritionist and eat more food.

## 2011-11-17 NOTE — Patient Instructions (Signed)
Please go to [facility] to get chest x-ray at your convenience (8:30-4) Please try and eat more food--you need to increase your caloric intake in order to prevent further weight loss.  I am going to try and refer you to a nutritionist to help you determine what your calorie goals are, and how to meet them with the foods that you like.  Continue all current medications, unless you hear from [place], based on your lab results.

## 2011-11-18 ENCOUNTER — Encounter: Payer: Self-pay | Admitting: Family Medicine

## 2011-11-18 LAB — LIPID PANEL
Cholesterol: 111 mg/dL (ref 0–200)
HDL: 44 mg/dL (ref >39)
LDL Cholesterol: 61 mg/dL (ref 0–99)
Triglycerides: 31 mg/dL (ref <150)

## 2011-11-18 LAB — COMPREHENSIVE METABOLIC PANEL
Alkaline Phosphatase: 49 U/L (ref 39–117)
BUN: 14 mg/dL (ref 6–23)
Calcium: 9.4 mg/dL (ref 8.4–10.5)
Chloride: 105 mEq/L (ref 96–112)
Glucose, Bld: 79 mg/dL (ref 70–99)
Sodium: 142 mEq/L (ref 135–145)
Total Bilirubin: 0.4 mg/dL (ref 0.3–1.2)
Total Protein: 6.3 g/dL (ref 6.0–8.3)

## 2011-11-18 MED ORDER — LEVOTHYROXINE SODIUM 100 MCG PO TABS: 100.0000 ug | ORAL_TABLET | Freq: Every day | ORAL | Status: DC

## 2011-11-29 ENCOUNTER — Encounter: Payer: Self-pay | Admitting: *Deleted

## 2011-11-29 ENCOUNTER — Encounter: Payer: PRIVATE HEALTH INSURANCE | Attending: Family Medicine | Admitting: *Deleted

## 2011-11-29 VITALS — Ht 64.0 in | Wt 112.6 lb

## 2011-11-29 DIAGNOSIS — Z713 Dietary counseling and surveillance: Secondary | ICD-10-CM | POA: Insufficient documentation

## 2011-11-29 DIAGNOSIS — R634 Abnormal weight loss: Secondary | ICD-10-CM | POA: Insufficient documentation

## 2011-11-29 NOTE — Patient Instructions (Signed)
Goals:  Follow Diabetes Meal Plan as instructed  Eat 3 meals and 2 snacks, every 3-5 hrs  Limit carbohydrate intake to 30-45 grams carbohydrate/meal  Limit carbohydrate intake to 15 grams carbohydrate/snack  Add lean protein foods to meals/snacks  Monitor glucose levels as instructed by your doctor  Continue active job  Continue to limit fat and cholesterol

## 2011-11-29 NOTE — Progress Notes (Signed)
  Medical Nutrition Therapy:  Appt start time: 1500 end time:  1600.   Assessment:  Primary concerns today: abnormal weight loss.   MEDICATIONS: see list   DIETARY INTAKE:  Usual eating pattern includes 3 meals and 3 snacks per day.  Everyday foods include lean proteins, starches, fruits, and vegetables.  Avoided foods include fatty foods, limits sugars.    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   Nutritional Diagnosis:  [place]-3.4 Unintentional weight gain As related to strict low fat, low carb diet and irregular eating pattern.  As evidenced by 10 lb weight loss in past year.    Intervention:  Nutrition counseling provided.  Nema is here because she's lost 10 pounds this past year.  She has diabetes and high cholesterol and was very nervous about eating foods she thought were bad for her so she never ate very much.  Recently she's been very busy and didn't stop and take the time to eat.  There have been days where she forgot to eat (and some days she forgot to take her medications).  Since her doctor's appointment on 8/8 she has been making a concerted effort to eat 3 meals/day and snacks.  She's gained 1.5 pounds since that visit 2 weeks ago.  She realizes how  important it is to fuel her body properly and she's making better choices.  Reviewed carb counting- ie what foods affect blood sugar so that she feels more comfortable making food choices, and reviewed heart-healthy eating.   Handouts given during visit include:  My meal plan card  Carb counting handout  Monitoring/Evaluation:  Dietary intake, exercise,  and body weight prn.

## 2011-12-13 ENCOUNTER — Ambulatory Visit
Admission: RE | Admit: 2011-12-13 | Discharge: 2011-12-13 | Disposition: A | Discharge status: Home / Self Care | Payer: PRIVATE HEALTH INSURANCE | Source: Ambulatory Visit | Attending: Family Medicine | Admitting: Family Medicine

## 2011-12-13 DIAGNOSIS — R634 Abnormal weight loss: Secondary | ICD-10-CM

## 2011-12-16 ENCOUNTER — Encounter: Payer: Self-pay | Admitting: *Deleted

## 2011-12-16 ENCOUNTER — Telehealth: Payer: Self-pay | Admitting: Family Medicine

## 2011-12-19 NOTE — Telephone Encounter (Signed)
Spoke with patient.

## 2012-02-08 ENCOUNTER — Encounter: Payer: Self-pay | Admitting: Family Medicine

## 2012-02-08 ENCOUNTER — Ambulatory Visit (INDEPENDENT_AMBULATORY_CARE_PROVIDER_SITE_OTHER): Payer: PRIVATE HEALTH INSURANCE | Admitting: Family Medicine

## 2012-02-08 VITALS — BP 110/78 | HR 76 | Ht 63.25 in | Wt 113.0 lb

## 2012-02-08 DIAGNOSIS — R634 Abnormal weight loss: Secondary | ICD-10-CM

## 2012-02-08 NOTE — Progress Notes (Signed)
Chief Complaint  Patient presents with  . Follow-up    2 month follow-up on weight   HPI:  Patient presents for follow up on her weight, as she had been losing weight.  Labs, thyroid and CXR were normal, and it was felt to be due to inadequate caloric intake. She saw the nutritionist since last visit, and had already gained some weight between our visit and her visit with nutritionist..  She works until 9pm, and wasn't eating enough at night.  She has increased her food intake in the evenings--eating sandwich now, rather than just snacking.   Feels well, denies problems.  Denies any headaches, dizziness, swelling.  Sugars are running 80-100. Doesn't check BP's elsewhere.  Denies any side effects from medications.  Getting flu shot at work soon.  Past Medical History  Diagnosis Date  . Hyperlipidemia small particles 6/06  . Breast cyst 3cm L breast  . Colonic polyp   . Diverticulosis   . Menopause 2002  . Osteopenia L hip  . Fatty liver   . Diabetes mellitus   . Hypertension   . Unspecified hypothyroidism 09/09/2010   Past Surgical History  Procedure Date  . Colonoscopy 6/05  . Breast biopsy 7/06 L breast benign  . Cesarean section    History   Social History  . Marital Status: Married    Spouse Name: N/A    Number of Children: 1  . 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 Smoking status: Never Smoker   . Smokeless tobacco: Never Used  . Alcohol Use: No  . Drug Use: No  . Sexually Active: Yes -- Female partner(s)   Other Topics Concern  . Not on file   Social History Narrative  . No narrative on file    Current outpatient prescriptions:alendronate (FOSAMAX) 70 MG tablet, take 1 tablet by mouth every week on an empty stomach with 6-8 oz of water. No food / meds for 30 min. Remain Upright, Disp: 4 tablet, Rfl: 5;  aspirin 81 MG tablet, Take 81 mg by mouth daily.  , Disp: ,  Rfl: ;  atorvastatin (LIPITOR) 80 MG tablet, Take 1 tablet (80 mg total) by mouth daily., Disp: 30 tablet, Rfl: 5 Calcium Carbonate-Vitamin D (CALCIUM 600 + D PO), Take 1 tablet by mouth 2 (two) times daily.  , Disp: , Rfl: ;  fish oil-omega-3 fatty acids 1000 MG capsule, Take 1 g by mouth daily. , Disp: , Rfl: ;  levothyroxine (SYNTHROID, LEVOTHROID) 100 MCG tablet, Take 1 tablet (100 mcg total) by mouth daily., Disp: 30 tablet, Rfl: 5;  lisinopril (PRINIVIL,ZESTRIL) 5 MG tablet, Take 1 tablet (5 mg total) by mouth daily., Disp: 30 tablet, Rfl: 5 metFORMIN (GLUCOPHAGE) 1000 MG tablet, Take 1 tablet (1,000 mg total) by mouth 2 (two) times daily with a meal., Disp: 60 tablet, Rfl: 5;  niacin (NIASPAN) 1000 MG CR tablet, Take 1 tablet (1,000 mg total) by mouth at bedtime., Disp: 30 tablet, Rfl: 5  No Known Allergies  ROS: Occasional sneezing.  Denies fevers, URI symptoms, cough, shortness of breath, chest pain, skin rash, nausea, vomiting, bowel changes (occasional diarrhea with certain foods).  PHYSICAL EXAM: BP 110/78  Pulse 76  Ht 5' 3.25" (1.607 m)  Wt 113 lb (51.256 kg)  BMI 19.86 kg/m2  Well developed, pleasant female in no distress Neck: no lymphadenopathy, thyromegaly or mass Heart: regular rate and rhythm Lungs:  clear Skin: no rash Psych: normal mood, affect, hygiene and grooming  ASSESSMENT/PLAN: 1. Unexplained weight loss    Weight stable/improved since increasing her intake. DM and HTN are well controlled.  Return for med check in February TSH, A1c, lipid, lft, glucose at visit (prefers not to come for labs prior)

## 2012-02-08 NOTE — Patient Instructions (Addendum)
Keep up the good work.  Ensure that you are eating adequate calories, and getting 5-7 servings of fruits and vegetables daily.  If eating small meals, have frequent snacks between.  Your weight was good today.  Continue all the same medications, and we will see you in February for your bloodwork and visit.

## 2012-04-30 ENCOUNTER — Other Ambulatory Visit: Payer: Self-pay | Admitting: Family Medicine

## 2012-04-30 NOTE — Telephone Encounter (Signed)
i THINK SHE IS YOUR PT IS THIS OK

## 2012-04-30 NOTE — Telephone Encounter (Signed)
Left message for pt to call me back 

## 2012-04-30 NOTE — Telephone Encounter (Signed)
Talked with pt she said she did not have dexa nor the mammo and asked if we would referr her I told her I would and I would send in 2 mth of meds and she has an apt 05/14/12 med check sent request to [facility]

## 2012-04-30 NOTE — Telephone Encounter (Signed)
She was due to have her DEXA done at [facility] in 06/2011.  I don't see any results in computer, don't know if she had study done.  Please call pt, and if not done, then schedule for her, and okay to refill until that appt.  Also, due for med check in Feb (see last OV from October), and it doesn't appear that she has this scheduled. Also looks like her last CPE was 06/2011, and she doesn't have another scheduled

## 2012-05-14 ENCOUNTER — Ambulatory Visit (INDEPENDENT_AMBULATORY_CARE_PROVIDER_SITE_OTHER): Payer: PRIVATE HEALTH INSURANCE | Admitting: Family Medicine

## 2012-05-14 ENCOUNTER — Encounter: Payer: Self-pay | Admitting: Family Medicine

## 2012-05-14 VITALS — BP 122/80 | HR 72 | Ht 63.25 in | Wt 111.0 lb

## 2012-05-14 DIAGNOSIS — E7800 Pure hypercholesterolemia, unspecified: Secondary | ICD-10-CM

## 2012-05-14 DIAGNOSIS — M8580 Other specified disorders of bone density and structure, unspecified site: Secondary | ICD-10-CM

## 2012-05-14 DIAGNOSIS — I10 Essential (primary) hypertension: Secondary | ICD-10-CM

## 2012-05-14 DIAGNOSIS — E039 Hypothyroidism, unspecified: Secondary | ICD-10-CM

## 2012-05-14 DIAGNOSIS — R5383 Other fatigue: Secondary | ICD-10-CM

## 2012-05-14 DIAGNOSIS — M949 Disorder of cartilage, unspecified: Secondary | ICD-10-CM

## 2012-05-14 DIAGNOSIS — E119 Type 2 diabetes mellitus without complications: Secondary | ICD-10-CM

## 2012-05-14 DIAGNOSIS — R5381 Other malaise: Secondary | ICD-10-CM

## 2012-05-14 LAB — CBC WITH DIFFERENTIAL/PLATELET
Basophils Relative: 0 % (ref 0–1)
Eosinophils Relative: 0 % (ref 0–5)
HCT: 36 % (ref 36.0–46.0)
Hemoglobin: 12.2 g/dL (ref 12.0–15.0)
Lymphocytes Relative: 18 % (ref 12–46)
MCHC: 33.9 g/dL (ref 30.0–36.0)
MCV: 84.7 fL (ref 78.0–100.0)
Monocytes Absolute: 0.3 10*3/uL (ref 0.1–1.0)
Monocytes Relative: 3 % (ref 3–12)
Neutro Abs: 6.2 10*3/uL (ref 1.7–7.7)
RDW: 14.6 % (ref 11.5–15.5)

## 2012-05-14 MED ORDER — LISINOPRIL 5 MG PO TABS: 5.0000 mg | ORAL_TABLET | Freq: Every day | ORAL | Status: DC

## 2012-05-14 MED ORDER — METFORMIN HCL 1000 MG PO TABS: 1000.0000 mg | ORAL_TABLET | Freq: Two times a day (BID) | ORAL | Status: DC

## 2012-05-14 NOTE — Patient Instructions (Signed)
You have lost a little weight again.  Please make sure that you are eating 3 meals + 2 snacks (at least) daily.  I do not want you losing further weight.  Once your labs are back, I will send in refills for thyroid and cholesterol meds.  If any changes are made, you will be called.  If all is normal, you should get letter in the mail, and meds will be refilled for 6 months.

## 2012-05-14 NOTE — Progress Notes (Signed)
Chief Complaint  Patient presents with  . Diabetes    fasting med check.   Patient presents for fasting med check with no concerns.  She was feeling a little weak due to fasting, so blood was drawn prior to visit so she could eat something.  Feeling better, but still a little fatigued.  Diabetes follow-up:  Blood sugars at home are running 85-90's (always <100).  Denies hypoglycemia.  Denies polydipsia and polyuria.  Last eye exam was in September, and in December.  Patient follows a low sugar diet and checks feet regularly without concerns.  Hypertension follow-up: Blood pressures are not checked elsewhere. Denies dizziness, headaches, chest pain, edema. Denies side effects of medications.   Hyperlipidemia follow-up: Patient is reportedly following a low-fat, low cholesterol diet. Compliant with medications and denies medication side effects.   Hypothyroidism: Denies fatigue, hair, skin or bowel changes.   Weight loss--wasn't aware that she lost more, but knows that she wasn't eating enough.  She hasn't been snacking in the evening like she had been.  Osteopenia: Has appt for DEXA 2/21.  Denies dysphagia, jaw pain.  Past Medical History  Diagnosis Date  . Hyperlipidemia small particles 6/06  . Breast cyst 3cm L breast  . Colonic polyp   . Diverticulosis   . Menopause 2002  . Osteopenia L hip  . Fatty liver   . Diabetes mellitus   . Hypertension   . Unspecified hypothyroidism 09/09/2010   Past Surgical History  Procedure Date  . Colonoscopy 6/05  . Breast biopsy 7/06 L breast benign  . Cesarean section    History   Social History  . Marital Status: Married    Spouse Name: N/A    Number of Children: 1  . 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 Smoking status: Never Smoker   . Smokeless tobacco: Never Used  . Alcohol Use: No  . Drug Use: No  . Sexually Active: Yes -- Female  partner(s)   Other Topics Concern  . Not on file   Social History Narrative  . No narrative on file   Current Outpatient Prescriptions on File Prior to Visit  Medication Sig Dispense Refill  . alendronate (FOSAMAX) 70 MG tablet take 1 tablet by mouth every week on an empty stomach with 6-8 oz of water. No food / meds for 30 min. Remain Upright  4 tablet  1  . aspirin 81 MG tablet Take 81 mg by mouth daily.        Marland Kitchen atorvastatin (LIPITOR) 80 MG tablet Take 1 tablet (80 mg total) by mouth daily.  30 tablet  5  . Calcium Carbonate-Vitamin D (CALCIUM 600 + D PO) Take 1 tablet by mouth 2 (two) times daily.        . fish oil-omega-3 fatty acids 1000 MG capsule Take 1 g by mouth daily.       Marland Kitchen levothyroxine (SYNTHROID, LEVOTHROID) 100 MCG tablet Take 1 tablet (100 mcg total) by mouth daily.  30 tablet  5  . lisinopril (PRINIVIL,ZESTRIL) 5 MG tablet Take 1 tablet (5 mg total) by mouth daily.  30 tablet  5  . metFORMIN (GLUCOPHAGE) 1000 MG tablet Take 1 tablet (1,000 mg total) by mouth 2 (two) times daily with a meal.  60 tablet  5  . niacin (NIASPAN) 1000 MG CR tablet Take 1 tablet (1,000 mg total) by mouth at bedtime.  30  tablet  5   No Known Allergies  ROS:  Denies fevers, URI symptoms.  Slight sinus problem occasionally.  Denies chest pain, shortness of breath, palpitations, nausea, vomiting, diarrhea, bleeding/bruising. Skin rash.  Denies bowel changes, hair/skin changes.  Denies depression. "most of the time I'm pretty good"--occasional fatigue  PHYSICAL EXAM: BP 122/80  Pulse 72  Ht 5' 3.25" (1.607 m)  Wt 111 lb (50.349 kg)  BMI 19.51 kg/m2 Somewhat tired-appearing female, in no distress Neck: no lymphadenopathy, thyromegaly or mass Heart: regular rate and rhythm without murmur Lungs: clear bilaterally Back: no CVA or spinal tenderness Abdomen: soft, nontender, no organomegaly or mass Extremities: no edema Skin: no rash Psych: normal mood, affect, hygiene and grooming.  Appears  somewhat tired.  Full range of affect.  Lab Results  Component Value Date   HGBA1C 5.9 05/14/2012   ASSESSMENT/PLAN: 1. Type II or unspecified type diabetes mellitus without mention of complication, not stated as uncontrolled  HgB A1c, Comprehensive metabolic panel, metFORMIN (GLUCOPHAGE) 1000 MG tablet  2. Essential hypertension, benign  Comprehensive metabolic panel, lisinopril (PRINIVIL,ZESTRIL) 5 MG tablet  3. Pure hypercholesterolemia  Lipid panel, Comprehensive metabolic panel  4. Unspecified hypothyroidism  TSH  5. Osteopenia  Vitamin D 25 hydroxy  6. Other malaise and fatigue  Vitamin D 25 hydroxy, CBC with Differential   Weight loss--admits to eating less; has been stable in the past when making a point of eating more food at each meal, and more snacks.  Encouraged adequate po intake.  She declines 3 month weight check, but promises to be better about eating. DM---well controlled HTN--controlled Lipids and thyroid--labs ordered.  Refill based on results.  F/u 6 months for CPE/med check, sooner prn  ADDENDUM: Lab Results  Component Value Date   TSH 0.353 05/14/2012   Dose decreased from to given borderline low TSH and weight loss.  Recheck TSH 2 months

## 2012-05-15 LAB — COMPREHENSIVE METABOLIC PANEL
AST: 21 U/L (ref 0–37)
Albumin: 4.2 g/dL (ref 3.5–5.2)
Alkaline Phosphatase: 52 U/L (ref 39–117)
Glucose, Bld: 115 mg/dL — ABNORMAL HIGH (ref 70–99)
Potassium: 4.3 mEq/L (ref 3.5–5.3)
Sodium: 142 mEq/L (ref 135–145)
Total Bilirubin: 0.4 mg/dL (ref 0.3–1.2)
Total Protein: 7 g/dL (ref 6.0–8.3)

## 2012-05-15 LAB — LIPID PANEL
HDL: 61 mg/dL (ref >39)
LDL Cholesterol: 66 mg/dL (ref 0–99)
Total CHOL/HDL Ratio: 2.2 Ratio
VLDL: 8 mg/dL (ref 0–40)

## 2012-05-15 LAB — VITAMIN D 25 HYDROXY (VIT D DEFICIENCY, FRACTURES): Vit D, 25-Hydroxy: 55 ng/mL (ref 30–89)

## 2012-05-15 MED ORDER — ATORVASTATIN CALCIUM 80 MG PO TABS: 80.0000 mg | ORAL_TABLET | Freq: Every day | ORAL | Status: DC

## 2012-05-15 MED ORDER — LEVOTHYROXINE SODIUM 88 MCG PO TABS: 88.0000 ug | ORAL_TABLET | Freq: Every day | ORAL | Status: DC

## 2012-05-15 NOTE — Progress Notes (Signed)
Quick Note:  Advise pt--chem panel is normal (except fasting sugar 115), liver/kidney normal. Lipids are at goal. Thyroid appears to be borderline over-replaced, which can contribute to weight loss. I'd like to cut back on her dose from to , and recheck TSH in 2 months (244.9, nonfasting lab visit). Rx was sent to pharmacy for new thyroid dose, and cholesterol med. ______

## 2012-05-28 ENCOUNTER — Other Ambulatory Visit: Payer: Self-pay | Admitting: *Deleted

## 2012-05-28 DIAGNOSIS — E039 Hypothyroidism, unspecified: Secondary | ICD-10-CM

## 2012-05-30 ENCOUNTER — Other Ambulatory Visit: Payer: Self-pay | Admitting: Family Medicine

## 2012-06-01 LAB — HM MAMMOGRAPHY: HM Mammogram: NORMAL

## 2012-06-08 ENCOUNTER — Encounter: Payer: Self-pay | Admitting: Family Medicine

## 2012-06-28 ENCOUNTER — Telehealth: Payer: Self-pay | Admitting: *Deleted

## 2012-06-28 NOTE — Telephone Encounter (Signed)
Checked patient's chart re:how long she has been on Fosamax. She was started on it 06/19/2007. She has 6 weeks of the medication left and would like to finish taking what she has left and then she will stop per your recommendation and recheck DXA in 2 years. Just an FYI. Thanks.

## 2012-07-01 ENCOUNTER — Other Ambulatory Visit: Payer: Self-pay | Admitting: Family Medicine

## 2012-07-03 ENCOUNTER — Other Ambulatory Visit: Payer: Self-pay | Admitting: Family Medicine

## 2012-08-07 ENCOUNTER — Other Ambulatory Visit: Payer: PRIVATE HEALTH INSURANCE

## 2012-08-07 DIAGNOSIS — E039 Hypothyroidism, unspecified: Secondary | ICD-10-CM

## 2012-11-28 ENCOUNTER — Other Ambulatory Visit: Payer: Self-pay | Admitting: Family Medicine

## 2012-12-17 ENCOUNTER — Encounter: Payer: Self-pay | Admitting: Family Medicine

## 2012-12-17 ENCOUNTER — Ambulatory Visit (INDEPENDENT_AMBULATORY_CARE_PROVIDER_SITE_OTHER): Payer: PRIVATE HEALTH INSURANCE | Admitting: Family Medicine

## 2012-12-17 VITALS — BP 120/72 | HR 76 | Ht 63.0 in | Wt 111.0 lb

## 2012-12-17 DIAGNOSIS — Z23 Encounter for immunization: Secondary | ICD-10-CM

## 2012-12-17 DIAGNOSIS — E039 Hypothyroidism, unspecified: Secondary | ICD-10-CM

## 2012-12-17 DIAGNOSIS — E7800 Pure hypercholesterolemia, unspecified: Secondary | ICD-10-CM

## 2012-12-17 DIAGNOSIS — Z0000 Encounter for general adult medical examination without abnormal findings: Secondary | ICD-10-CM

## 2012-12-17 DIAGNOSIS — I10 Essential (primary) hypertension: Secondary | ICD-10-CM

## 2012-12-17 DIAGNOSIS — E119 Type 2 diabetes mellitus without complications: Secondary | ICD-10-CM

## 2012-12-17 LAB — POCT URINALYSIS DIPSTICK
Ketones, UA: NEGATIVE
Leukocytes, UA: NEGATIVE
Nitrite, UA: NEGATIVE
Protein, UA: NEGATIVE
pH, UA: 5

## 2012-12-17 LAB — COMPREHENSIVE METABOLIC PANEL
ALT: 13 U/L (ref 0–35)
AST: 24 U/L (ref 0–37)
Albumin: 4.3 g/dL (ref 3.5–5.2)
BUN: 13 mg/dL (ref 6–23)
Calcium: 9.8 mg/dL (ref 8.4–10.5)
Chloride: 103 mEq/L (ref 96–112)
Potassium: 4.3 mEq/L (ref 3.5–5.3)

## 2012-12-17 LAB — LIPID PANEL
Cholesterol: 136 mg/dL (ref 0–200)
VLDL: 8 mg/dL (ref 0–40)

## 2012-12-17 LAB — HEMOCCULT GUIAC POC 1CARD (OFFICE)

## 2012-12-17 MED ORDER — METFORMIN HCL 1000 MG PO TABS: 1000.0000 mg | ORAL_TABLET | Freq: Two times a day (BID) | ORAL | Status: DC

## 2012-12-17 MED ORDER — LISINOPRIL 5 MG PO TABS: ORAL_TABLET | ORAL | Status: DC

## 2012-12-17 MED ORDER — ATORVASTATIN CALCIUM 80 MG PO TABS: ORAL_TABLET | ORAL | Status: DC

## 2012-12-17 MED ORDER — LEVOTHYROXINE SODIUM 88 MCG PO TABS: 88.0000 ug | ORAL_TABLET | Freq: Every day | ORAL | Status: DC

## 2012-12-17 MED ORDER — NIACIN ER (ANTIHYPERLIPIDEMIC) 1000 MG PO TBCR: EXTENDED_RELEASE_TABLET | ORAL | Status: DC

## 2012-12-17 NOTE — Progress Notes (Signed)
Chief Complaint  Patient presents with  . Annual Exam    fasting annual exam with pelvic. Patient is scheduled for diabetic eye exam this Wed with Dr.Hecker. No concerns today, just needs meds refilled. Patient states she will get flu shot at work.    Sierra Combs is a 62 y.o. female who presents for a complete physical.  She is also here for fasting med check.  She has no specific complaints or concerns.  Diabetes follow-up: Blood sugars at home are running 77-90's (always <100). Denies hypoglycemia. Denies polydipsia and polyuria. Eye exam is scheduled for later this week. Patient follows a low sugar diet and checks feet regularly without concerns.   Hypertension follow-up: Blood pressures are not checked elsewhere. Denies dizziness, headaches, chest pain, edema. Denies side effects of medications.   Hyperlipidemia follow-up: Patient is reportedly following a low-fat, low cholesterol diet. Compliant with medications and denies medication side effects.  Hypothyroidism: Denies fatigue, hair, skin or bowel changes.   Weight loss--denies any significant change.  Her appetite is good; she reports being active 24/7.  She is no longer eating in the dining room, but is packing lunch. She thinks she gained weight when she ate in the dining room, and that her current weight is normal for her.  Review of chart shows her weight is the same as this time last year.  Osteopenia:  She had DEXA in 05/2012, which showed T-1.3.  She had been on Fosamax for many years (>5), so it was stopped after that DEXA.  Plan to recheck in 2 years.  Immunization History  Administered Date(s) Administered  . Influenza Split 02/10/2012  . Pneumococcal Conjugate 05/04/2007  . Td 06/17/1997  . Tdap 05/04/2007, 12/17/2012   Gets flu shots yearly at work  Last Pap smear: 06/2011  Last mammogram: 05/2012  Last colonoscopy: 09/2008  Last DEXA: 05/2012 Dentist: a few years ago, plans to go ("on her list") Ophtho: UTD,  scheduled for this week Exercise: Walks 15-20 minutes daily, plus does 4 flights of stairs up and down (15 minutes).  Other than walking, not getting weight-bearing exercise (rare).  Past Medical History  Diagnosis Date  . Hyperlipidemia small particles 6/06  . Breast cyst 3cm L breast  . Colonic polyp   . Diverticulosis   . Menopause 2002  . Osteopenia L hip  . Fatty liver   . Diabetes mellitus   . Hypertension   . Unspecified hypothyroidism 09/09/2010    Past Surgical History  Procedure Laterality Date  . Colonoscopy  6/05, 09/2008  . Breast biopsy  7/06 L breast benign  . Cesarean section      History   Social History  . Marital Status: Married    Spouse Name: N/A    Number of Children: 1  . 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 Smoking status: Never Smoker   . Smokeless tobacco: Never Used  . Alcohol Use: No  . Drug Use: No  . Sexual Activity: Yes    Partners: Male   Other Topics Concern  . Not on file   Social History Narrative   Lives with husband, no pets.  Daughter lives in [place]    Family History  Problem Relation Age of Onset  . Diabetes Mother   . Heart disease Mother   . Hyperlipidemia Mother   . Hypertension Mother   . Diabetes Father   .  Heart disease Father   . Hyperlipidemia Father   . Hypertension Father   . Diabetes Sister   . Diabetes Brother     legs amputated  . Cancer Neg Hx   . Diabetes Sister     Current outpatient prescriptions:aspirin 81 MG tablet, Take 81 mg by mouth daily.  , Disp: , Rfl: ;  atorvastatin (LIPITOR) 80 MG tablet, take 1 tablet by mouth once daily, Disp: 30 tablet, Rfl: 11;  Calcium Carbonate-Vitamin D (CALCIUM 600 + D PO), Take 1 tablet by mouth 2 (two) times daily.  , Disp: , Rfl: ;  fish oil-omega-3 fatty acids 1000 MG capsule, Take 1 g by mouth daily. , Disp: , Rfl:  levothyroxine (SYNTHROID, LEVOTHROID)  88 MCG tablet, Take 1 tablet (88 mcg total) by mouth daily., Disp: 30 tablet, Rfl: 11;  lisinopril (PRINIVIL,ZESTRIL) 5 MG tablet, take 1 tablet by mouth once daily, Disp: 30 tablet, Rfl: 5;  metFORMIN (GLUCOPHAGE) 1000 MG tablet, Take 1 tablet (1,000 mg total) by mouth 2 (two) times daily with a meal., Disp: 60 tablet, Rfl: 5 niacin (NIASPAN) 1000 MG CR tablet, take 1 tablet by mouth at bedtime, Disp: 30 tablet, Rfl: 11  No Known Allergies  ROS: The patient denies anorexia, fever, weight changes, headaches, vision changes, decreased hearing, ear pain, sore throat, breast concerns, chest pain, palpitations, dizziness, syncope, dyspnea on exertion, cough, swelling, nausea, vomiting, diarrhea, constipation, abdominal pain, melena, hematochezia, indigestion/heartburn, hematuria, incontinence, dysuria, vaginal bleeding, discharge, odor or itch, genital lesions, numbness, tingling, weakness, tremor, suspicious skin lesions, depression, anxiety, abnormal bleeding/bruising, or enlarged lymph nodes.  +mild allergies/congestion intermittently Some vaginal dryness Occasional knee pain, worse when wearing certain shoes   PHYSICAL EXAM:  BP 120/72  Pulse 76  Ht 5\' 3"  (1.6 m)  Wt 111 lb (50.349 kg)  BMI 19.67 kg/m2  General Appearance:  Alert, cooperative, no distress, appears stated age   Head:  Normocephalic, without obvious abnormality, atraumatic   Eyes:  PERRL, conjunctiva/corneas clear, EOM's intact, fundi  benign   Ears:  Normal TM's and external ear canals   Nose:  Nares normal, mucosa mildly edematous with clear mucus, no drainage or sinus tenderness   Throat:  Lips, mucosa, and tongue normal; teeth in poor repair with some decay   Neck:  Supple, no lymphadenopathy; thyroid: no enlargement/tenderness/nodules; no carotid  bruit or JVD   Back:  Spine nontender, no curvature, ROM normal, no CVA tenderness   Lungs:  Clear to auscultation bilaterally without wheezes, rales or ronchi; respirations  unlabored   Chest Wall:  No tenderness or deformity   Heart:  Regular rate and rhythm, S1 and S2 normal, no murmur, rub  or gallop   Breast Exam:  No tenderness, masses, or nipple discharge or inversion. No axillary lymphadenopathy. Some increased fibroglandularity on LUOQ compared to R. Nontender, no discrete mass.   Abdomen:  Soft, non-tender, nondistended, normoactive bowel sounds,  no masses, no hepatosplenomegaly   Genitalia:  Normal external genitalia without lesions. BUS and vagina normal; no cervical motion tenderness. No abnormal vaginal discharge. Uterus and adnexa not enlarged, nontender, no masses. Pap not performed   Rectal:  Normal tone, no masses or tenderness; trace positive guaiac stool   Extremities:  No clubbing, cyanosis or edema   Pulses:  2+ and symmetric all extremities   Skin:  Skin color, texture, turgor normal, no rashes or lesions   Lymph nodes:  Cervical, supraclavicular, and axillary nodes normal   Neurologic:  CNII-XII intact, normal  strength, sensation and gait; reflexes 2+ and symmetric throughout   Psych: Normal mood, affect, hygiene and grooming.   Lab Results  Component Value Date   HGBA1C 5.9 12/17/2012   ASSESSMENT/PLAN:  Routine general medical examination at a health care facility - Plan: Visual acuity screening, POCT Urinalysis Dipstick  Type II or unspecified type diabetes mellitus without mention of complication, not stated as uncontrolled - well controlled - Plan: HgB A1c, Comprehensive metabolic panel, Microalbumin / creatinine urine ratio, metFORMIN (GLUCOPHAGE) 1000 MG tablet, HM Diabetes Foot Exam  Pure hypercholesterolemia - check labs today.  Has been very well controlled.  will only check lipids yearly, LFTs q6 mos (check today) - Plan: Lipid panel, Comprehensive metabolic panel, niacin (NIASPAN) 1000 MG CR tablet, atorvastatin (LIPITOR) 80 MG tablet  Essential hypertension, benign - periodically check BP elsewhere; borderline at first,  normal on repeat - Plan: Comprehensive metabolic panel, lisinopril (PRINIVIL,ZESTRIL) 5 MG tablet  Unspecified hypothyroidism - euthyroid by history.  Check TSH today; if normal, just yearly - Plan: TSH, levothyroxine (SYNTHROID, LEVOTHROID) 88 MCG tablet  Need for Tdap vaccination - Plan: Tdap vaccine greater than or equal to 7yo IM   Discussed monthly self breast exams and yearly mammograms; at least 30 minutes of aerobic activity at least 5 days/week; proper sunscreen use reviewed; healthy diet, including goals of calcium and vitamin D intake and alcohol recommendations (less than or equal to 1 drink/day) reviewed; regular seatbelt use; changing batteries in smoke detectors. Immunization recommendations discussed--UTD (will get flu shot at work), recommended zostavax. Colonoscopy recommendations reviewed--UTD   zostavax recommended--risks/side effects reviewed. Pt to check insurance coverage and either schedule nurse visit or give at next med check if desired.  Pt given Tdap again today, as there was a miscommunication.  F/u 6 months for med check, sooner prn.

## 2012-12-17 NOTE — Patient Instructions (Addendum)
HEALTH MAINTENANCE RECOMMENDATIONS:  It is recommended that you get at least 30 minutes of aerobic exercise at least 5 days/week (for weight loss, you may need as much as 60-90 minutes). This can be any activity that gets your heart rate up. This can be divided in 10-15 minute intervals if needed, but try and build up your endurance at least once a week.  Weight bearing exercise is also recommended twice weekly.  Eat a healthy diet with lots of vegetables, fruits and fiber.  "Colorful" foods have a lot of vitamins (ie green vegetables, tomatoes, red peppers, etc).  Limit sweet tea, regular sodas and alcoholic beverages, all of which has a lot of calories and sugar.  Up to 1 alcoholic drink daily may be beneficial for women (unless trying to lose weight, watch sugars).  Drink a lot of water.  Calcium recommendations are 1200-1500 mg daily (1500 mg for postmenopausal women or women without ovaries), and vitamin D 1000 IU daily.  This should be obtained from diet and/or supplements (vitamins), and calcium should not be taken all at once, but in divided doses.  Monthly self breast exams and yearly mammograms for women over the age of 32 is recommended.  Sunscreen of at least SPF 30 should be used on all sun-exposed parts of the skin when outside between the hours of 10 am and 4 pm (not just when at beach or pool, but even with exercise, golf, tennis, and yard work!)  Use a sunscreen that says "broad spectrum" so it covers both UVA and UVB rays, and make sure to reapply every 1-2 hours.  Remember to change the batteries in your smoke detectors when changing your clock times in the spring and fall.  Use your seat belt every time you are in a car, and please drive safely and not be distracted with cell phones and texting while driving.  Check insurance coverage and either schedule nurse visit or give at next med check if desired. Please schedule appointment with dentist.

## 2012-12-18 LAB — MICROALBUMIN / CREATININE URINE RATIO: Creatinine, Urine: 41.7 mg/dL

## 2012-12-31 ENCOUNTER — Other Ambulatory Visit: Payer: Self-pay | Admitting: Family Medicine

## 2013-01-02 ENCOUNTER — Encounter: Payer: Self-pay | Admitting: *Deleted

## 2013-01-04 ENCOUNTER — Other Ambulatory Visit: Payer: Self-pay | Admitting: *Deleted

## 2013-01-04 DIAGNOSIS — E039 Hypothyroidism, unspecified: Secondary | ICD-10-CM

## 2013-01-04 MED ORDER — LEVOTHYROXINE SODIUM 75 MCG PO TABS: 75.0000 ug | ORAL_TABLET | Freq: Every day | ORAL | Status: DC

## 2013-02-26 ENCOUNTER — Other Ambulatory Visit: Payer: Self-pay

## 2013-03-01 ENCOUNTER — Other Ambulatory Visit: Payer: PRIVATE HEALTH INSURANCE

## 2013-03-01 DIAGNOSIS — E039 Hypothyroidism, unspecified: Secondary | ICD-10-CM

## 2013-03-02 LAB — TSH: TSH: 4.51 u[IU]/mL — ABNORMAL HIGH (ref 0.350–4.500)

## 2013-03-04 ENCOUNTER — Telehealth: Payer: Self-pay | Admitting: Family Medicine

## 2013-03-04 ENCOUNTER — Other Ambulatory Visit: Payer: Self-pay | Admitting: *Deleted

## 2013-03-04 DIAGNOSIS — E039 Hypothyroidism, unspecified: Secondary | ICD-10-CM

## 2013-03-04 MED ORDER — LEVOTHYROXINE SODIUM 75 MCG PO TABS: 75.0000 ug | ORAL_TABLET | Freq: Every day | ORAL | Status: DC

## 2013-03-05 NOTE — Telephone Encounter (Signed)
Done

## 2013-06-17 ENCOUNTER — Encounter: Payer: Self-pay | Admitting: Family Medicine

## 2013-06-17 ENCOUNTER — Ambulatory Visit (INDEPENDENT_AMBULATORY_CARE_PROVIDER_SITE_OTHER): Payer: PRIVATE HEALTH INSURANCE | Admitting: Family Medicine

## 2013-06-17 VITALS — BP 110/76 | HR 68 | Ht 63.0 in | Wt 115.0 lb

## 2013-06-17 DIAGNOSIS — E119 Type 2 diabetes mellitus without complications: Secondary | ICD-10-CM

## 2013-06-17 DIAGNOSIS — I10 Essential (primary) hypertension: Secondary | ICD-10-CM

## 2013-06-17 DIAGNOSIS — E7800 Pure hypercholesterolemia, unspecified: Secondary | ICD-10-CM

## 2013-06-17 DIAGNOSIS — E039 Hypothyroidism, unspecified: Secondary | ICD-10-CM

## 2013-06-17 LAB — COMPREHENSIVE METABOLIC PANEL
ALT: 13 U/L (ref 0–35)
AST: 22 U/L (ref 0–37)
Albumin: 3.8 g/dL (ref 3.5–5.2)
Alkaline Phosphatase: 47 U/L (ref 39–117)
BUN: 12 mg/dL (ref 6–23)
CALCIUM: 9.1 mg/dL (ref 8.4–10.5)
CHLORIDE: 103 meq/L (ref 96–112)
CO2: 31 meq/L (ref 19–32)
CREATININE: 0.62 mg/dL (ref 0.50–1.10)
GLUCOSE: 85 mg/dL (ref 70–99)
Potassium: 4.5 mEq/L (ref 3.5–5.3)
Sodium: 141 mEq/L (ref 135–145)
TOTAL PROTEIN: 6.6 g/dL (ref 6.0–8.3)
Total Bilirubin: 0.4 mg/dL (ref 0.2–1.2)

## 2013-06-17 LAB — LIPID PANEL
Cholesterol: 131 mg/dL (ref 0–200)
HDL: 54 mg/dL (ref >39)
LDL CALC: 71 mg/dL (ref 0–99)
Total CHOL/HDL Ratio: 2.4 Ratio
Triglycerides: 30 mg/dL (ref <150)
VLDL: 6 mg/dL (ref 0–40)

## 2013-06-17 LAB — TSH: TSH: 3.975 u[IU]/mL (ref 0.350–4.500)

## 2013-06-17 LAB — POCT GLYCOSYLATED HEMOGLOBIN (HGB A1C): Hemoglobin A1C: 5.9

## 2013-06-17 MED ORDER — LISINOPRIL 5 MG PO TABS: ORAL_TABLET | ORAL | Status: DC

## 2013-06-17 MED ORDER — METFORMIN HCL 1000 MG PO TABS: 1000.0000 mg | ORAL_TABLET | Freq: Two times a day (BID) | ORAL | Status: DC

## 2013-06-17 NOTE — Patient Instructions (Signed)
Continue your current medications. Your diabetes and blood pressure are well controlled.  We will contact you with your results soon--and refill your thyroid medication once labs are reviewed.  It is best for your health to avoid significant passive tobacco exposure (second hand smoke). Encourage your husband to quit smoking, or have him smoke outside.  Available resources to help him quit include free counseling through [facility] ([place]Quitline.com or 1-800-QUITNOW), smoking cessation classes through [facility] (call to find out schedule), over-the-counter nicotine replacements, and e-cigarettes (although this may not help break the hand-mouth habit).  Many insurance companies also have smoking cessation programs (which may decrease the cost of patches, meds if enrolled).

## 2013-06-17 NOTE — Progress Notes (Signed)
Chief Complaint  Patient presents with  . Diabetes    fasting med check.    Patient presents for 6 month check without any specific complaints.  Hypothyroidism:  Last TSH in November was borderline on the 84mg dose (previously was over-replaced on 88 mcg).  Denies change in energy, bowel, skin or hair changes. Due for recheck today.  Diabetes follow-up: Blood sugars at home are running around 80 in the mornings (checks q3d).  She only rarely checks it in the evenings, runs 125-130.  Denies hypoglycemia. Denies polydipsia and polyuria. Eye exams twice yearly, in September and December, doing well. Patient follows a low sugar diet and checks feet regularly without concerns.   Hypertension follow-up: Blood pressures are not checked elsewhere. Denies dizziness, headaches, chest pain, edema. Denies side effects of medications.   Hyperlipidemia follow-up: Patient is reportedly following a low-fat, low cholesterol diet. Compliant with medications and denies medication side effects.   Weight loss--Weight is stable, up 4 pounds from her last visit. Her appetite is good; she reports being active 24/7. She is no longer eating in the dining room, but is packing lunch. She thinks she gained weight when she ate in the dining room, and that her current weight is normal for her.  Past Medical History  Diagnosis Date  . Hyperlipidemia small particles 6/06  . Breast cyst 3cm L breast  . Colonic polyp   . Diverticulosis   . Menopause 2002  . Osteopenia L hip  . Fatty liver   . Diabetes mellitus   . Hypertension   . Unspecified hypothyroidism 09/09/2010   Past Surgical History  Procedure Laterality Date  . Colonoscopy  6/05, 09/2008  . Breast biopsy  7/06 L breast benign  . Cesarean section     History   Social History  . Marital Status: Married    Spouse Name: N/A    Number of Children: 1  . 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 Smoking status: Never Smoker   . Smokeless tobacco: Never Used  . Alcohol Use: No  . Drug Use: No  . Sexual Activity: Yes    Partners: Male   Other Topics Concern  . Not on file   Social History Narrative   Lives with husband, no pets.  Daughter lives in [place]  +passive tobacco exposure from her husband (and smokes inside)   Outpatient Encounter Prescriptions as of 06/17/2013  Medication Sig  . aspirin 81 MG tablet Take 81 mg by mouth daily.    .Marland Kitchenatorvastatin (LIPITOR) 80 MG tablet take 1 tablet by mouth once daily  . Calcium Carbonate-Vitamin D (CALCIUM 600 + D PO) Take 1 tablet by mouth 2 (two) times daily.    . fish oil-omega-3 fatty acids 1000 MG capsule Take 1 g by mouth daily.   .Marland Kitchenlevothyroxine (SYNTHROID, LEVOTHROID) 75 MCG tablet Take 1 tablet (75 mcg total) by mouth daily before breakfast.  . lisinopril (PRINIVIL,ZESTRIL) 5 MG tablet take 1 tablet by mouth once daily  . metFORMIN (GLUCOPHAGE) 1000 MG tablet Take 1 tablet (1,000 mg total) by mouth 2 (two) times daily with a meal.  . niacin (NIASPAN) 1000 MG CR tablet take 1 tablet by mouth at bedtime   ROS:  Denies fevers, chills, URI symptoms, cough, shortness of breath, headaches, dizziness, chest pain, palpitations, nausea, vomiting, bowel changes, urinary complaints, vaginal discharge, bleeding, skin lesions/rashes.  Moods are good.  Weight is stable.  PHYSICAL EXAM: BP 110/76  Pulse 68  Ht _0  (1.6 m)  Wt 115 lb (52.164 kg)  BMI 20.38 kg/m2 Well developed, pleasant female in no distress Neck: no lymphadenopathy, thyromegaly or mass  Heart: regular rate and rhythm without murmur  Lungs: clear bilaterally  Back: no CVA or spinal tenderness  Abdomen: soft, nontender, no organomegaly or mass  Extremities: no edema  Skin: no rash  Psych: normal mood, affect, hygiene and grooming.   A1c 5.9  ASSESSMENT/PLAN:  Type II or unspecified type diabetes mellitus  without mention of complication, not stated as uncontrolled - well controlled - Plan: HgB A1c, Comprehensive metabolic panel, HM Diabetes Foot Exam, lisinopril (PRINIVIL,ZESTRIL) 5 MG tablet, metFORMIN (GLUCOPHAGE) 1000 MG tablet  Essential hypertension, benign - controlled - Plan: Comprehensive metabolic panel, lisinopril (PRINIVIL,ZESTRIL) 5 MG tablet  Pure hypercholesterolemia - Plan: Lipid panel, Comprehensive metabolic panel  Unspecified hypothyroidism - Plan: TSH   c-met, lipids, TSH  6 mos--CPE/med check  Refill thyroid med after TSH back

## 2013-06-18 ENCOUNTER — Encounter: Payer: Self-pay | Admitting: Family Medicine

## 2013-06-18 MED ORDER — LEVOTHYROXINE SODIUM 75 MCG PO TABS: 75.0000 ug | ORAL_TABLET | Freq: Every day | ORAL | Status: DC

## 2013-06-18 NOTE — Addendum Note (Signed)
Addended by: Rita Ohara on: 06/18/2013 08:35 AM   Modules accepted: Orders

## 2013-11-15 LAB — HM COLONOSCOPY

## 2013-12-04 ENCOUNTER — Encounter: Payer: Self-pay | Admitting: *Deleted

## 2013-12-10 LAB — HM MAMMOGRAPHY

## 2013-12-17 ENCOUNTER — Encounter: Payer: Self-pay | Admitting: Family Medicine

## 2013-12-17 ENCOUNTER — Encounter: Payer: Self-pay | Admitting: Internal Medicine

## 2013-12-23 LAB — HM DIABETES FOOT EXAM

## 2013-12-26 ENCOUNTER — Encounter: Payer: Self-pay | Admitting: *Deleted

## 2013-12-30 ENCOUNTER — Encounter: Payer: Self-pay | Admitting: Family Medicine

## 2013-12-30 ENCOUNTER — Ambulatory Visit (INDEPENDENT_AMBULATORY_CARE_PROVIDER_SITE_OTHER): Payer: PRIVATE HEALTH INSURANCE | Admitting: Family Medicine

## 2013-12-30 VITALS — BP 124/80 | HR 80 | Ht 63.5 in | Wt 118.0 lb

## 2013-12-30 DIAGNOSIS — I10 Essential (primary) hypertension: Secondary | ICD-10-CM

## 2013-12-30 DIAGNOSIS — M949 Disorder of cartilage, unspecified: Secondary | ICD-10-CM

## 2013-12-30 DIAGNOSIS — M899 Disorder of bone, unspecified: Secondary | ICD-10-CM

## 2013-12-30 DIAGNOSIS — Z79899 Other long term (current) drug therapy: Secondary | ICD-10-CM

## 2013-12-30 DIAGNOSIS — M8580 Other specified disorders of bone density and structure, unspecified site: Secondary | ICD-10-CM | POA: Insufficient documentation

## 2013-12-30 DIAGNOSIS — Z0000 Encounter for general adult medical examination without abnormal findings: Secondary | ICD-10-CM

## 2013-12-30 DIAGNOSIS — E119 Type 2 diabetes mellitus without complications: Secondary | ICD-10-CM

## 2013-12-30 DIAGNOSIS — E039 Hypothyroidism, unspecified: Secondary | ICD-10-CM

## 2013-12-30 DIAGNOSIS — E7800 Pure hypercholesterolemia, unspecified: Secondary | ICD-10-CM

## 2013-12-30 DIAGNOSIS — Z2911 Encounter for prophylactic immunotherapy for respiratory syncytial virus (RSV): Secondary | ICD-10-CM

## 2013-12-30 DIAGNOSIS — Z23 Encounter for immunization: Secondary | ICD-10-CM

## 2013-12-30 LAB — CBC WITH DIFFERENTIAL/PLATELET
BASOS ABS: 0 10*3/uL (ref 0.0–0.1)
BASOS PCT: 0 % (ref 0–1)
EOS ABS: 0.1 10*3/uL (ref 0.0–0.7)
EOS PCT: 2 % (ref 0–5)
HCT: 36.1 % (ref 36.0–46.0)
Hemoglobin: 12.2 g/dL (ref 12.0–15.0)
Lymphocytes Relative: 25 % (ref 12–46)
Lymphs Abs: 1.9 10*3/uL (ref 0.7–4.0)
MCH: 29.7 pg (ref 26.0–34.0)
MCHC: 33.8 g/dL (ref 30.0–36.0)
MCV: 87.8 fL (ref 78.0–100.0)
Monocytes Absolute: 0.4 10*3/uL (ref 0.1–1.0)
Monocytes Relative: 5 % (ref 3–12)
Neutro Abs: 5 10*3/uL (ref 1.7–7.7)
Neutrophils Relative %: 68 % (ref 43–77)
PLATELETS: 312 10*3/uL (ref 150–400)
RBC: 4.11 MIL/uL (ref 3.87–5.11)
RDW: 14.9 % (ref 11.5–15.5)
WBC: 7.4 10*3/uL (ref 4.0–10.5)

## 2013-12-30 LAB — POCT URINALYSIS DIPSTICK
BILIRUBIN UA: NEGATIVE
Blood, UA: NEGATIVE
Glucose, UA: NEGATIVE
KETONES UA: NEGATIVE
Leukocytes, UA: NEGATIVE
Nitrite, UA: NEGATIVE
Protein, UA: NEGATIVE
SPEC GRAV UA: 1.02
Urobilinogen, UA: NEGATIVE
pH, UA: 5

## 2013-12-30 LAB — POCT GLYCOSYLATED HEMOGLOBIN (HGB A1C): Hemoglobin A1C: 5.8

## 2013-12-30 MED ORDER — METFORMIN HCL 1000 MG PO TABS: 1000.0000 mg | ORAL_TABLET | Freq: Two times a day (BID) | ORAL | Status: DC

## 2013-12-30 MED ORDER — ATORVASTATIN CALCIUM 80 MG PO TABS: ORAL_TABLET | ORAL | Status: DC

## 2013-12-30 MED ORDER — NIACIN ER (ANTIHYPERLIPIDEMIC) 1000 MG PO TBCR: EXTENDED_RELEASE_TABLET | ORAL | Status: DC

## 2013-12-30 MED ORDER — LISINOPRIL 5 MG PO TABS: ORAL_TABLET | ORAL | Status: DC

## 2013-12-30 NOTE — Patient Instructions (Signed)
  HEALTH MAINTENANCE RECOMMENDATIONS:  It is recommended that you get at least 30 minutes of aerobic exercise at least 5 days/week (for weight loss, you may need as much as 60-90 minutes). This can be any activity that gets your heart rate up. This can be divided in 10-15 minute intervals if needed, but try and build up your endurance at least once a week.  Weight bearing exercise is also recommended twice weekly.  Eat a healthy diet with lots of vegetables, fruits and fiber.  "Colorful" foods have a lot of vitamins (ie green vegetables, tomatoes, red peppers, etc).  Limit sweet tea, regular sodas and alcoholic beverages, all of which has a lot of calories and sugar.  Up to 1 alcoholic drink daily may be beneficial for women (unless trying to lose weight, watch sugars).  Drink a lot of water.  Calcium recommendations are 1200-1500 mg daily (1500 mg for postmenopausal women or women without ovaries), and vitamin D 1000 IU daily.  This should be obtained from diet and/or supplements (vitamins), and calcium should not be taken all at once, but in divided doses.  Monthly self breast exams and yearly mammograms for women over the age of 17 is recommended.  Sunscreen of at least SPF 30 should be used on all sun-exposed parts of the skin when outside between the hours of 10 am and 4 pm (not just when at beach or pool, but even with exercise, golf, tennis, and yard work!)  Use a sunscreen that says "broad spectrum" so it covers both UVA and UVB rays, and make sure to reapply every 1-2 hours.  Remember to change the batteries in your smoke detectors when changing your clock times in the spring and fall.  Use your seat belt every time you are in a car, and please drive safely and not be distracted with cell phones and texting while driving.  Please schedule your dental visit.  Your bone density test is due again in February 2016.  Please call to schedule at [facility] (and they will fax me the order to  sign).  Make sure that you wait 30 days after getting the shingles vaccine (today) before getting your flu shot at work.

## 2013-12-30 NOTE — Progress Notes (Signed)
Chief Complaint  Patient presents with  . cpe/med check    cpe, med check with fasting labs. no breast or pelvic exam. had eye exam last monday. unable to get a urine specimen, gets flu shot at work. did A1C today   Sierra Combs is a 63 y.o. female who presents for a complete physical.  She has the following concerns:  Hypothyroidism: Last TSH was 3.975 6 months ago. In November it was borderline on the 34mcg dose (previously was over-replaced on 88 mcg). Denies change in energy, bowel, skin or hair changes; some weight gain. Due for recheck today. No missed doses; takes it separately from other medications and on an empty stomach.  Diabetes follow-up: Blood sugars at home are running around 85-95 in the mornings (checks3x/week). She only rarely checks it in the evenings, recalls it was 135 recently. Denies hypoglycemia. Denies polydipsia and polyuria. Eye exams twice yearly, in September and December, doing well. Patient follows a low sugar diet and checks feet regularly without concerns.   Hypertension follow-up: Blood pressures are not checked elsewhere. Denies dizziness, headaches, chest pain, edema. Denies side effects of medications.   Hyperlipidemia follow-up: Patient is reportedly following a low-fat, low cholesterol diet. Compliant with medications and denies medication side effects.   Osteopenia: She had DEXA in 05/2012, which showed T-1.3. She had been on Fosamax for many years (>5), so it was stopped after that DEXA. Plan was to recheck in 2 years.    Immunization History  Administered Date(s) Administered  . Influenza Split 02/10/2012  . Pneumococcal Conjugate-13 05/04/2007  . Td 06/17/1997  . Tdap 05/04/2007, 12/17/2012  she will be getting flu shot at work in November (always gets at [facility]) Last Pap smear: 06/2011  Last mammogram: 12/2013 Last colonoscopy: 11/2013 with Dr. Penelope Coop (due again 11/2018)  Last DEXA: 05/2012  Dentist: a few years ago, plans to go ("on her  list") --she said this last year also, but still hasn't gone Ophtho: recent, with Dr. Herbert Deaner Exercise: Walks 4 flights of stairs up and down (15 minutes) 4-5x/week. Using the gym at work prior to going home--rides the bicycle (with arms--pushing/pulling), and some dumbbells.  Past Medical History  Diagnosis Date  . Hyperlipidemia small particles 6/06  . Breast cyst 3cm L breast  . Colonic polyp   . Diverticulosis   . Menopause 2002  . Osteopenia L hip  . Fatty liver   . Diabetes mellitus   . Hypertension   . Unspecified hypothyroidism 09/09/2010    Past Surgical History  Procedure Laterality Date  . Colonoscopy  6/05, 09/2008, 11/2013    Dr. Penelope Coop  . Breast biopsy  7/06 L breast benign  . Cesarean section      History   Social History  . Marital Status: Married    Spouse Name: N/A    Number of Children: 1  . Years of Education: N/A   Occupational History  . housekeeping [employer]   Social History Main Topics  . Smoking status: Never Smoker   . Smokeless tobacco: Never Used  . Alcohol Use: No  . Drug Use: No  . Sexual Activity: Yes    Partners: Male   Other Topics Concern  . Not on file   Social History Narrative   Lives with husband, no pets.  Daughter lives in [place].  +passive tobacco exposure from her husband (and smokes inside)    Family History  Problem Relation Age of Onset  . Diabetes Mother   .  Heart disease Mother   . Hyperlipidemia Mother   . Hypertension Mother   . Diabetes Father   . Heart disease Father   . Hyperlipidemia Father   . Hypertension Father   . Diabetes Sister   . Diabetes Brother     legs amputated  . Cancer Neg Hx   . Diabetes Sister    Outpatient Encounter Prescriptions as of 12/30/2013  Medication Sig  . aspirin 81 MG tablet Take 81 mg by mouth daily.    Marland Kitchen atorvastatin (LIPITOR) 80 MG tablet take 1 tablet by mouth once daily  . Calcium Carbonate-Vitamin D (CALCIUM 600 + D PO) Take 1  tablet by mouth 2 (two) times daily.    . fish oil-omega-3 fatty acids 1000 MG capsule Take 1 g by mouth daily.   Marland Kitchen levothyroxine (SYNTHROID, LEVOTHROID) 75 MCG tablet Take 1 tablet (75 mcg total) by mouth daily before breakfast.  . lisinopril (PRINIVIL,ZESTRIL) 5 MG tablet take 1 tablet by mouth once daily  . metFORMIN (GLUCOPHAGE) 1000 MG tablet Take 1 tablet (1,000 mg total) by mouth 2 (two) times daily with a meal.  . niacin (NIASPAN) 1000 MG CR tablet take 1 tablet by mouth at bedtime    No Known Allergies  ROS: The patient denies anorexia, fever, weight changes (slight gain), headaches, vision changes, decreased hearing, ear pain, sore throat, breast concerns, chest pain, palpitations, dizziness, syncope, dyspnea on exertion, cough, swelling, nausea, vomiting, diarrhea, constipation, abdominal pain, melena, hematochezia, indigestion/heartburn, hematuria, incontinence, dysuria, vaginal bleeding, discharge, odor or itch, genital lesions, numbness, tingling, weakness, tremor, suspicious skin lesions, depression, anxiety, abnormal bleeding/bruising (some--unchanged), or enlarged lymph nodes.  Some vaginal dryness (very mild) She has some slight sinus headache with weather changes, relieved by Claritin prn. Occasional knee pain, worse when wearing certain shoes, and also with weather changes.. No joint swelling.  PHYSICAL EXAM:  BP 124/80  Pulse 80  Ht 5' 3.5" (1.613 m)  Wt 118 lb (53.524 kg)  BMI 20.57 kg/m2  General Appearance:  Alert, cooperative, no distress, appears stated age   Head:  Normocephalic, without obvious abnormality, atraumatic   Eyes:  PERRL, conjunctiva/corneas clear, EOM's intact, fundi  benign   Ears:  Normal TM's and external ear canals   Nose:  Nares normal, mucosa mildly edematous with clear mucus, no drainage or sinus tenderness   Throat:  Lips, mucosa, and tongue normal; teeth in poor repair with some decay   Neck:  Supple, no lymphadenopathy; thyroid: no  enlargement/tenderness/nodules; no carotid  bruit or JVD   Back:  Spine nontender, no curvature, ROM normal, no CVA tenderness   Lungs:  Clear to auscultation bilaterally without wheezes, rales or ronchi; respirations unlabored   Chest Wall:  No tenderness or deformity   Heart:  Regular rate and rhythm, S1 and S2 normal, no murmur, rub  or gallop   Breast Exam:  No tenderness, masses, or nipple discharge or inversion. No axillary lymphadenopathy. Some increased fibroglandularity on LUOQ compared to R, also noted last year. Nontender, no discrete mass.   Abdomen:  Soft, non-tender, nondistended, normoactive bowel sounds,  no masses, no hepatosplenomegaly; small, easily reducible umbilical hernia  Genitalia:  Normal external genitalia without lesions. BUS and vagina normal; no cervical motion tenderness. No abnormal vaginal discharge. Uterus and adnexa not enlarged, nontender, no masses. Pap not performed   Rectal:  Normal tone, no masses or tenderness; heme negative guaiac stool   Extremities:  No clubbing, cyanosis or edema. Normal diabetic foot exam.  Pulses:  2+ and symmetric all extremities   Skin:  Skin color, texture, turgor normal, no rashes or lesions. Dry skin with some hyperpigmentation on upper back.  Lymph nodes:  Cervical, supraclavicular, and axillary nodes normal   Neurologic:  CNII-XII intact, normal strength, sensation and gait; reflexes 2+ and symmetric throughout          Psych: Normal mood, affect, hygiene and grooming.   Lab Results  Component Value Date   HGBA1C 5.8% 12/30/2013   ASSESSMENT/PLAN:  Routine general medical examination at a health care facility - Plan: Lipid panel, Comprehensive metabolic panel, CBC with Differential, TSH, POCT urinalysis dipstick  Type II or unspecified type diabetes mellitus without mention of complication, not stated as uncontrolled - well controlled - Plan: POCT glycosylated hemoglobin (Hb A1C), Comprehensive metabolic panel,  Microalbumin / creatinine urine ratio, HM Diabetes Foot Exam, metFORMIN (GLUCOPHAGE) 1000 MG tablet  Unspecified hypothyroidism - Plan: TSH  Essential hypertension, benign - controlled - Plan: Comprehensive metabolic panel, lisinopril (PRINIVIL,ZESTRIL) 5 MG tablet  Pure hypercholesterolemia - Plan: Lipid panel, atorvastatin (LIPITOR) 80 MG tablet, niacin (NIASPAN) 1000 MG CR tablet  Osteopenia - DEXA due (at [facility]) 05/2014.  Pt advised to schedule  Encounter for long-term (current) use of other medications - Plan: Lipid panel, Comprehensive metabolic panel, CBC with Differential, TSH, Microalbumin / creatinine urine ratio  Need for shingles vaccine - risks/side effects reviewed in detail.  Aware that she needs to wait 30 days before getting her flu shot at work - Plan: Varicella-zoster vaccine subcutaneous   Discussed monthly self breast exams and yearly mammograms; at least 30 minutes of aerobic activity at least 5 days/week; proper sunscreen use reviewed; healthy diet, including goals of calcium and vitamin D intake and alcohol recommendations (less than or equal to 1 drink/day) reviewed; regular seatbelt use; changing batteries in smoke detectors. Immunization recommendations discussed--zostavax today; flu shot at work (at least 30 days from today).  Colonoscopy recommendations reviewed--UTD, due again in 5 years.  DEXA due 05/2014 at [facility]  Strongly encouraged to set up dental visit.

## 2013-12-31 ENCOUNTER — Other Ambulatory Visit: Payer: Self-pay | Admitting: Family Medicine

## 2013-12-31 LAB — COMPREHENSIVE METABOLIC PANEL
ALK PHOS: 58 U/L (ref 39–117)
ALT: 13 U/L (ref 0–35)
AST: 23 U/L (ref 0–37)
Albumin: 4.1 g/dL (ref 3.5–5.2)
BUN: 14 mg/dL (ref 6–23)
CO2: 29 mEq/L (ref 19–32)
Calcium: 9.6 mg/dL (ref 8.4–10.5)
Chloride: 105 mEq/L (ref 96–112)
Creat: 0.83 mg/dL (ref 0.50–1.10)
Glucose, Bld: 88 mg/dL (ref 70–99)
POTASSIUM: 4.1 meq/L (ref 3.5–5.3)
SODIUM: 141 meq/L (ref 135–145)
TOTAL PROTEIN: 6.9 g/dL (ref 6.0–8.3)
Total Bilirubin: 0.3 mg/dL (ref 0.2–1.2)

## 2013-12-31 LAB — MICROALBUMIN / CREATININE URINE RATIO
Creatinine, Urine: 84.2 mg/dL
Microalb Creat Ratio: 5.9 mg/g (ref 0.0–30.0)
Microalb, Ur: 0.5 mg/dL (ref 0.00–1.89)

## 2013-12-31 LAB — LIPID PANEL
CHOL/HDL RATIO: 1.9 ratio
Cholesterol: 131 mg/dL (ref 0–200)
HDL: 69 mg/dL (ref >39)
LDL Cholesterol: 54 mg/dL (ref 0–99)
Triglycerides: 40 mg/dL (ref <150)
VLDL: 8 mg/dL (ref 0–40)

## 2013-12-31 LAB — TSH: TSH: 5.012 u[IU]/mL — ABNORMAL HIGH (ref 0.350–4.500)

## 2013-12-31 MED ORDER — LEVOTHYROXINE SODIUM 75 MCG PO TABS: 75.0000 ug | ORAL_TABLET | Freq: Every day | ORAL | Status: DC

## 2014-02-10 ENCOUNTER — Encounter: Payer: Self-pay | Admitting: Family Medicine

## 2014-07-01 ENCOUNTER — Other Ambulatory Visit: Payer: Self-pay | Admitting: Family Medicine

## 2014-07-01 NOTE — Telephone Encounter (Signed)
Need to know if patient can wait till the 30th to get refills when her appointment is

## 2014-07-01 NOTE — Telephone Encounter (Signed)
Left message for patient to call back  

## 2014-07-02 NOTE — Telephone Encounter (Signed)
Sierra Combs called today and still has not had a return call

## 2014-07-09 ENCOUNTER — Encounter: Payer: Self-pay | Admitting: Family Medicine

## 2014-07-09 ENCOUNTER — Ambulatory Visit (INDEPENDENT_AMBULATORY_CARE_PROVIDER_SITE_OTHER): Payer: PRIVATE HEALTH INSURANCE | Admitting: Family Medicine

## 2014-07-09 VITALS — BP 122/74 | HR 76 | Ht 63.0 in | Wt 117.2 lb

## 2014-07-09 DIAGNOSIS — I10 Essential (primary) hypertension: Secondary | ICD-10-CM | POA: Diagnosis not present

## 2014-07-09 DIAGNOSIS — E039 Hypothyroidism, unspecified: Secondary | ICD-10-CM

## 2014-07-09 DIAGNOSIS — E119 Type 2 diabetes mellitus without complications: Secondary | ICD-10-CM

## 2014-07-09 DIAGNOSIS — E7800 Pure hypercholesterolemia, unspecified: Secondary | ICD-10-CM

## 2014-07-09 DIAGNOSIS — M8580 Other specified disorders of bone density and structure, unspecified site: Secondary | ICD-10-CM | POA: Diagnosis not present

## 2014-07-09 LAB — COMPREHENSIVE METABOLIC PANEL
ALT: 16 U/L (ref 0–35)
AST: 25 U/L (ref 0–37)
Albumin: 3.8 g/dL (ref 3.5–5.2)
Alkaline Phosphatase: 53 U/L (ref 39–117)
BUN: 14 mg/dL (ref 6–23)
CO2: 28 mEq/L (ref 19–32)
Calcium: 9 mg/dL (ref 8.4–10.5)
Chloride: 103 mEq/L (ref 96–112)
Creat: 0.77 mg/dL (ref 0.50–1.10)
GLUCOSE: 82 mg/dL (ref 70–99)
Potassium: 4.3 mEq/L (ref 3.5–5.3)
SODIUM: 140 meq/L (ref 135–145)
Total Bilirubin: 0.5 mg/dL (ref 0.2–1.2)
Total Protein: 6.6 g/dL (ref 6.0–8.3)

## 2014-07-09 LAB — LIPID PANEL
Cholesterol: 127 mg/dL (ref 0–200)
HDL: 62 mg/dL (ref >=46)
LDL Cholesterol: 59 mg/dL (ref 0–99)
Total CHOL/HDL Ratio: 2 Ratio
Triglycerides: 32 mg/dL (ref <150)
VLDL: 6 mg/dL (ref 0–40)

## 2014-07-09 LAB — POCT GLYCOSYLATED HEMOGLOBIN (HGB A1C): Hemoglobin A1C: 6.4

## 2014-07-09 LAB — TSH: TSH: 4.532 u[IU]/mL — AB (ref 0.350–4.500)

## 2014-07-09 MED ORDER — LEVOTHYROXINE SODIUM 75 MCG PO TABS: 75.0000 ug | ORAL_TABLET | Freq: Every day | ORAL | Status: DC

## 2014-07-09 NOTE — Patient Instructions (Signed)
Your average blood sugars were up a little since your last check (A1c today is 6.4%, up from 5.8% 6 months ago).  Try and limit your sweets and carbs (but don't cut back on your overall caloric intake--I don't want you losing weight again).  Start checking your blood sugars more frequently in the evenings (2 hours after a meal or at bedtime).  You probably can check less often in the morning, since those are all good. Try and avoid skipping meals. You are due for repeat bone density test--please call [facility] to schedule the test.  Call with the name of your test strips when you are due for more, so that your insurance can help cover it (if they cover this brand).

## 2014-07-09 NOTE — Progress Notes (Signed)
Chief Complaint  Patient presents with  . fasting med check    fasting med check. no other concerns   Hypothyroidism: Review of chart shows that she has been borderline on the 63mcg dose, and over-replaced on the 28mcg dose.  Last check was 6 months ago, and TSH was 5.012, without symptoms. Due for recheck today. No missed doses; takes it separately from other medications and on an empty stomach. She currently is without any symptoms--no fatigue, hair/skin/mood/bowel or weight changes.  Diabetes follow-up: Blood sugars at home are running around 80-85 in the mornings (checks 3x/week). She only rarely checks it in the evenings, recalls it being around 130-135. She also finds that if she doesn't eat enough to eat (for dinner) her sugars the next morning are a little higher, >100--can't recall what she may have eaten for it to be higher. Denies hypoglycemia. Denies polydipsia and polyuria. Eye exams twice yearly, in September and December, doing well; at last visit (03/2014) told she could just go yearly in September (due again 2016). Patient follows a low sugar diet and checks feet regularly without concerns.   Hypertension follow-up: Blood pressures are not checked elsewhere. Denies dizziness, headaches, chest pain, edema. Denies side effects of medications.   Hyperlipidemia follow-up: Patient is reportedly following a low-fat, low cholesterol diet. Compliant with medications and denies medication side effects.  No flushing or myalgias.  Osteopenia: She had DEXA in 05/2012, which showed T-1.3. She had been on Fosamax for many years (>5), so it was stopped after that DEXA. Plan was to recheck in 2 years.   PMH, PSH, SH and FH reviewed.  Outpatient Encounter Prescriptions as of 07/09/2014  Medication Sig  . aspirin 81 MG tablet Take 81 mg by mouth daily.    Marland Kitchen atorvastatin (LIPITOR) 80 MG tablet take 1 tablet by mouth once daily  . Calcium Carbonate-Vitamin D (CALCIUM 600 + D PO) Take 1 tablet by  mouth 2 (two) times daily.    . fish oil-omega-3 fatty acids 1000 MG capsule Take 1 g by mouth daily.   Marland Kitchen levothyroxine (SYNTHROID, LEVOTHROID) 75 MCG tablet Take 1 tablet (75 mcg total) by mouth daily before breakfast.  . lisinopril (PRINIVIL,ZESTRIL) 5 MG tablet take 1 tablet by mouth once daily  . metFORMIN (GLUCOPHAGE) 1000 MG tablet take 1 tablet by mouth twice a day with food  . niacin (NIASPAN) 1000 MG CR tablet take 1 tablet by mouth at bedtime  . [DISCONTINUED] levothyroxine (SYNTHROID, LEVOTHROID) 75 MCG tablet take 1 tablet by mouth once daily BEFORE BREAKFAST (Patient not taking: Reported on 07/09/2014)   No Known Allergies  ROS:  Denies fevers, chills, weight changes, headaches, dizziness, numbness, tingling, syncope. No chest pain, shortness of breath, palpitations.  No nausea, vomiting, heartburn, bowel changes, hair/skin changes, bleeding, bruising, rash. No URI symptoms, cough. Moods are good. See HPI.  PHYSICAL EXAM: BP 122/74 mmHg  Pulse 76  Ht 5\' 3"  (1.6 m)  Wt 117 lb 3.2 oz (53.162 kg)  BMI 20.77 kg/m2  Well developed, pleasant female in no distress Neck: no lymphadenopathy, thyromegaly or mass, no carotid bruit Heart: regular rate and rhythm without murmur  Lungs: clear bilaterally  Back: no CVA or spinal tenderness  Abdomen: soft, nontender, no organomegaly or mass  Extremities: no edema, 2+ pulse. Normal diabetic foot exam Skin: no rash, normal sensation Psych: normal mood, affect, hygiene and grooming.  Neuro: alert and oriented.  Cranial nerves intact. Normal strength, sensation, gait.   A1c today is 6.4%,  up from 5.8% 6 months ago   ASSESSMENT/PLAN:  Type 2 diabetes mellitus without complication - controlled, but significant increase in A1c. diet reviewed in detail; check in evenings more frequently - Plan: POCT glycosylated hemoglobin (Hb A1C), Comprehensive metabolic panel  Essential hypertension, benign - controlled - Plan: Comprehensive  metabolic panel  Pure hypercholesterolemia - Plan: Lipid panel, Comprehensive metabolic panel  Hypothyroidism, unspecified hypothyroidism type - euthyroid by history. borderline TSH on 8mcg dose in past.  recheck today - Plan: TSH  Osteopenia - due for DEXA at [facility] rx given to patient   Due for repeat DEXA, now that she has been off fosamax for 2 years.  Pt to call [facility] to schedule.  All meds (except thyroid and niaspan) were refilled last week.  Refill for another 5 months when we get request next month. Needs thyroid med refilled now, but await today's lab results. niaspan was written for a year in September.   Start checking your blood sugars more frequently in the evenings (2 hours after a meal or at bedtime).  You probably can check less often in the morning, since those are all good. Try and avoid skipping meals. You are due for repeat bone density test--please call [facility] to schedule the test.  Call with the name of your test strips when you are due for more, so that your insurance can help cover it (if they cover this brand).

## 2014-07-10 ENCOUNTER — Encounter: Payer: Self-pay | Admitting: Family Medicine

## 2014-08-05 ENCOUNTER — Other Ambulatory Visit: Payer: Self-pay | Admitting: Family Medicine

## 2014-12-25 LAB — HM DIABETES FOOT EXAM

## 2014-12-25 LAB — HM DIABETES EYE EXAM

## 2014-12-31 ENCOUNTER — Other Ambulatory Visit: Payer: Self-pay | Admitting: Family Medicine

## 2015-01-02 LAB — HM DEXA SCAN

## 2015-01-02 LAB — HM MAMMOGRAPHY: HM Mammogram: NORMAL

## 2015-01-15 ENCOUNTER — Encounter: Payer: Self-pay | Admitting: Family Medicine

## 2015-01-19 ENCOUNTER — Other Ambulatory Visit (HOSPITAL_COMMUNITY)
Admission: RE | Admit: 2015-01-19 | Discharge: 2015-01-19 | Disposition: A | Discharge status: Home / Self Care | Payer: PRIVATE HEALTH INSURANCE | Source: Ambulatory Visit | Attending: Family Medicine | Admitting: Family Medicine

## 2015-01-19 ENCOUNTER — Encounter: Payer: Self-pay | Admitting: Family Medicine

## 2015-01-19 ENCOUNTER — Ambulatory Visit (INDEPENDENT_AMBULATORY_CARE_PROVIDER_SITE_OTHER): Payer: PRIVATE HEALTH INSURANCE | Admitting: Family Medicine

## 2015-01-19 VITALS — BP 120/80 | HR 64 | Ht 63.0 in | Wt 118.0 lb

## 2015-01-19 DIAGNOSIS — E119 Type 2 diabetes mellitus without complications: Secondary | ICD-10-CM

## 2015-01-19 DIAGNOSIS — Z1151 Encounter for screening for human papillomavirus (HPV): Secondary | ICD-10-CM | POA: Insufficient documentation

## 2015-01-19 DIAGNOSIS — Z5181 Encounter for therapeutic drug level monitoring: Secondary | ICD-10-CM

## 2015-01-19 DIAGNOSIS — Z01419 Encounter for gynecological examination (general) (routine) without abnormal findings: Secondary | ICD-10-CM | POA: Diagnosis present

## 2015-01-19 DIAGNOSIS — E039 Hypothyroidism, unspecified: Secondary | ICD-10-CM | POA: Diagnosis not present

## 2015-01-19 DIAGNOSIS — Z0000 Encounter for general adult medical examination without abnormal findings: Secondary | ICD-10-CM

## 2015-01-19 DIAGNOSIS — M8580 Other specified disorders of bone density and structure, unspecified site: Secondary | ICD-10-CM

## 2015-01-19 DIAGNOSIS — E7800 Pure hypercholesterolemia, unspecified: Secondary | ICD-10-CM

## 2015-01-19 DIAGNOSIS — I10 Essential (primary) hypertension: Secondary | ICD-10-CM | POA: Diagnosis not present

## 2015-01-19 LAB — POCT URINALYSIS DIPSTICK
BILIRUBIN UA: NEGATIVE
Blood, UA: NEGATIVE
Glucose, UA: NEGATIVE
KETONES UA: NEGATIVE
LEUKOCYTES UA: NEGATIVE
Nitrite, UA: NEGATIVE
PH UA: 6
Protein, UA: NEGATIVE
Spec Grav, UA: 1.015
Urobilinogen, UA: NEGATIVE

## 2015-01-19 LAB — COMPREHENSIVE METABOLIC PANEL
ALBUMIN: 4.2 g/dL (ref 3.6–5.1)
ALK PHOS: 60 U/L (ref 33–130)
ALT: 15 U/L (ref 6–29)
AST: 25 U/L (ref 10–35)
BUN: 14 mg/dL (ref 7–25)
CHLORIDE: 101 mmol/L (ref 98–110)
CO2: 26 mmol/L (ref 20–31)
CREATININE: 0.64 mg/dL (ref 0.50–0.99)
Calcium: 10 mg/dL (ref 8.6–10.4)
Glucose, Bld: 99 mg/dL (ref 65–99)
POTASSIUM: 4.1 mmol/L (ref 3.5–5.3)
Sodium: 140 mmol/L (ref 135–146)
TOTAL PROTEIN: 7.1 g/dL (ref 6.1–8.1)
Total Bilirubin: 0.5 mg/dL (ref 0.2–1.2)

## 2015-01-19 LAB — LIPID PANEL
Cholesterol: 157 mg/dL (ref 125–200)
HDL: 77 mg/dL (ref >=46)
LDL CALC: 72 mg/dL (ref <130)
TRIGLYCERIDES: 41 mg/dL (ref <150)
Total CHOL/HDL Ratio: 2 Ratio (ref <=5.0)
VLDL: 8 mg/dL (ref <30)

## 2015-01-19 LAB — CBC WITH DIFFERENTIAL/PLATELET
BASOS ABS: 0 10*3/uL (ref 0.0–0.1)
BASOS PCT: 0 % (ref 0–1)
EOS PCT: 1 % (ref 0–5)
Eosinophils Absolute: 0.1 10*3/uL (ref 0.0–0.7)
HCT: 36.9 % (ref 36.0–46.0)
Hemoglobin: 12.1 g/dL (ref 12.0–15.0)
LYMPHS PCT: 27 % (ref 12–46)
Lymphs Abs: 2 10*3/uL (ref 0.7–4.0)
MCH: 28.4 pg (ref 26.0–34.0)
MCHC: 32.8 g/dL (ref 30.0–36.0)
MCV: 86.6 fL (ref 78.0–100.0)
MONO ABS: 0.3 10*3/uL (ref 0.1–1.0)
MPV: 8.9 fL (ref 8.6–12.4)
Monocytes Relative: 4 % (ref 3–12)
Neutro Abs: 5 10*3/uL (ref 1.7–7.7)
Neutrophils Relative %: 68 % (ref 43–77)
PLATELETS: 339 10*3/uL (ref 150–400)
RBC: 4.26 MIL/uL (ref 3.87–5.11)
RDW: 14.5 % (ref 11.5–15.5)
WBC: 7.3 10*3/uL (ref 4.0–10.5)

## 2015-01-19 LAB — POCT GLYCOSYLATED HEMOGLOBIN (HGB A1C): HEMOGLOBIN A1C: 6.1

## 2015-01-19 MED ORDER — ATORVASTATIN CALCIUM 80 MG PO TABS: 80.0000 mg | ORAL_TABLET | Freq: Every day | ORAL | Status: DC

## 2015-01-19 MED ORDER — LISINOPRIL 5 MG PO TABS: 5.0000 mg | ORAL_TABLET | Freq: Every day | ORAL | Status: DC

## 2015-01-19 MED ORDER — LEVOTHYROXINE SODIUM 75 MCG PO TABS: 75.0000 ug | ORAL_TABLET | Freq: Every day | ORAL | Status: DC

## 2015-01-19 MED ORDER — NIACIN ER (ANTIHYPERLIPIDEMIC) 1000 MG PO TBCR: EXTENDED_RELEASE_TABLET | ORAL | Status: DC

## 2015-01-19 MED ORDER — METFORMIN HCL 1000 MG PO TABS: 1000.0000 mg | ORAL_TABLET | Freq: Two times a day (BID) | ORAL | Status: DC

## 2015-01-19 NOTE — Addendum Note (Signed)
Addended by: Carolee Rota F on: 01/19/2015 10:14 AM   Modules accepted: Orders

## 2015-01-19 NOTE — Patient Instructions (Signed)
  HEALTH MAINTENANCE RECOMMENDATIONS:  It is recommended that you get at least 30 minutes of aerobic exercise at least 5 days/week (for weight loss, you may need as much as 60-90 minutes). This can be any activity that gets your heart rate up. This can be divided in 10-15 minute intervals if needed, but try and build up your endurance at least once a week.  Weight bearing exercise is also recommended twice weekly.  Eat a healthy diet with lots of vegetables, fruits and fiber.  "Colorful" foods have a lot of vitamins (ie green vegetables, tomatoes, red peppers, etc).  Limit sweet tea, regular sodas and alcoholic beverages, all of which has a lot of calories and sugar.  Up to 1 alcoholic drink daily may be beneficial for women (unless trying to lose weight, watch sugars).  Drink a lot of water.  Calcium recommendations are 1200-1500 mg daily (1500 mg for postmenopausal women or women without ovaries), and vitamin D 1000 IU daily.  This should be obtained from diet and/or supplements (vitamins), and calcium should not be taken all at once, but in divided doses.  Monthly self breast exams and yearly mammograms for women over the age of 11 is recommended.  Sunscreen of at least SPF 30 should be used on all sun-exposed parts of the skin when outside between the hours of 10 am and 4 pm (not just when at beach or pool, but even with exercise, golf, tennis, and yard work!)  Use a sunscreen that says "broad spectrum" so it covers both UVA and UVB rays, and make sure to reapply every 1-2 hours.  Remember to change the batteries in your smoke detectors when changing your clock times in the spring and fall.  Use your seat belt every time you are in a car, and please drive safely and not be distracted with cell phones and texting while driving.  Please schedule a routine dental visit.  Continue regular exercise--you will have less knee pain if you avoid stairs and hills.  Walking, biking, swimming are all easier  on the knees.  Continue the weight-bearing exercise at least 2x/week.  Your next bone density test should be in 12/2016.

## 2015-01-19 NOTE — Progress Notes (Signed)
Chief Complaint  Patient presents with  . Annual Exam    fasting annual exam with pap/med check. Did not do eye exam as she just had one last month @ [facility]. Could not give UA today but is not having any urinary issues.    Sierra Combs is a 64 y.o. female who presents for a complete physical.  She has the following concerns:  Hypothyroidism: Review of chart shows that she has been borderline on the 17mg dose, and over-replaced on the 877m dose. Last check was 6 months ago, and TSH was 4.532. No missed doses; takes it separately from other medications and on an empty stomach. She currently is without any symptoms--no fatigue, hair/skin/mood/bowel or weight changes.  Diabetes follow-up: Blood sugars at home are running around 87-90 in the mornings (checks 3x/week). She hasn't been checking it in the evening.  Denies hypoglycemia. Denies polydipsia and polyuria. Eye exams are twice yearly, last was in 12/2014.  Patient follows a low sugar diet and checks feet regularly without concerns.   Hypertension follow-up: Blood pressures are not checked elsewhere. Denies dizziness, headaches, chest pain, edema. Denies side effects of medications.   Hyperlipidemia follow-up: Patient is reportedly following a low-fat, low cholesterol diet. Compliant with medications and denies medication side effects. No flushing or myalgias.  Osteopenia: She had DEXA in 05/2012, which showed T-1.3. She had been on Fosamax for many years (>5), so it was stopped after that DEXA. [facility], and T -1.5, 2.6% decline.  Vitamin D level was normal at 55 in 05/2012.   Immunization History  Administered Date(s) Administered  . Influenza Split 02/10/2012  . Influenza-Unspecified 01/13/2015  . Pneumococcal Polysaccharide-23 05/04/2007  . Td 06/17/1997  . Tdap 05/04/2007, 12/17/2012  . Zoster 12/30/2013   gets flu shots at work yearly Last Pap smear: 06/2011  Last mammogram: 12/2014 Last  colonoscopy: 11/2013 with Dr. GaPenelope Coopdue again 11/2018)  Last DEXA: 12/2014 Dentist: a few years ago, plans to go ("on her list") --she said this the last 2 years also, but still hasn't gone. Plans to call her sister's dentist Ophtho: recent, with Dr. HeHerbert Deanerxercise: Walks 4 flights of stairs up and down (15 minutes) --not as often (used to do 4-5x/week, she enjoys this) due to some knee pain/arthritis. Using the gym at work prior to going home--rides the bicycle (with arms--pushing/pulling), and some dumbbells.      ROS: The patient denies anorexia, fever, weight changes, headaches, vision changes, decreased hearing, ear pain, sore throat, breast concerns, chest pain, palpitations, dizziness, syncope, dyspnea on exertion, cough, swelling, nausea, vomiting, diarrhea, constipation, abdominal pain, melena, hematochezia, indigestion/heartburn, hematuria, incontinence, dysuria, vaginal bleeding, discharge, odor or itch, genital lesions, numbness, tingling, weakness, tremor, suspicious skin lesions, depression, anxiety, abnormal bleeding/bruising or enlarged lymph nodes.  Some vaginal dryness (very mild) She has some slight sinus headache with weather changes, relieved by Claritin prn. Right knee pain, intermittently, worse when wearing certain shoes, and also with weather changes.. She saw an orthopedist when the right knee started swelling, and was diagnosed with arthritis (and to cut back on her stairs for exercise). She had a cortisone shot, and knee is much better.   PHYSICAL EXAM:  BP 120/80 mmHg  Pulse 64  Ht '5\' 3"'  (1.6 m)  Wt 118 lb (53.524 kg)  BMI 20.91 kg/m2  General Appearance:  Alert, cooperative, no distress, appears stated age   Head:  Normocephalic, without obvious abnormality, atraumatic   Eyes:  PERRL, conjunctiva/corneas clear, EOM's intact, fundi  benign   Ears:  Normal TM's and external ear canals   Nose:  Nares normal, mucosa mildly edematous, no erythema, no  drainage or sinus tenderness   Throat:  Lips, mucosa, and tongue normal; teeth in poor repair with some decay   Neck:  Supple, no lymphadenopathy; thyroid: no enlargement/tenderness/nodules; no carotid  bruit or JVD   Back:  Spine nontender, no curvature, ROM normal, no CVA tenderness   Lungs:  Clear to auscultation bilaterally without wheezes, rales or ronchi; respirations unlabored   Chest Wall:  No tenderness or deformity   Heart:  Regular rate and rhythm, S1 and S2 normal, no murmur, rub  or gallop   Breast Exam:  No tenderness, masses, or nipple discharge or inversion. No axillary lymphadenopathy. Nontender, no masses..   Abdomen:  Soft, non-tender, nondistended, normoactive bowel sounds,  no masses, no hepatosplenomegaly; small, easily reducible umbilical hernia  Genitalia:  Normal external genitalia without lesions. Mild atrophic changes and vaginal dryness. BUS and vagina normal; no cervical motion tenderness. Nulliparous cervix without lesions or discharge. No abnormal vaginal discharge. Uterus and adnexa not enlarged, nontender, no masses. Pap performed   Rectal:  Normal tone, no masses or tenderness; no stool in vault to heme-test  Extremities:  No clubbing, cyanosis or edema. Normal diabetic foot exam.  Pulses:  2+ and symmetric all extremities   Skin:  Skin color, texture, turgor normal, no rashes or lesions.  Lymph nodes:  Cervical, supraclavicular, and axillary nodes normal   Neurologic:  CNII-XII intact, normal strength, sensation and gait; reflexes 2+ and symmetric throughout     Psych: Normal mood, affect, hygiene and grooming       Normal diabetic foot exam  Lab Results  Component Value Date   HGBA1C 6.1 01/19/2015     ASSESSMENT/PLAN:  Annual physical exam - Plan: Lipid panel, Comprehensive metabolic panel, CBC with Differential/Platelet, TSH, Cytology - PAP [place]  Diabetes mellitus without  complication ([place]) - well controlled - Plan: HgB A1c, Microalbumin / creatinine urine ratio  Type 2 diabetes mellitus without complication, without long-term current use of insulin ([place]) - well controlled - Plan: Comprehensive metabolic panel, metFORMIN (GLUCOPHAGE) 1000 MG tablet  Hypothyroidism, unspecified hypothyroidism type - euthyroid by history; borderline TSH in past on current dose. recheck today - Plan: TSH, levothyroxine (SYNTHROID, LEVOTHROID) 75 MCG tablet  Essential hypertension, benign - controlled - Plan: Comprehensive metabolic panel, lisinopril (PRINIVIL,ZESTRIL) 5 MG tablet  Pure hypercholesterolemia - controlled (check today, then just once yearly) - Plan: Lipid panel, Comprehensive metabolic panel, niacin (NIASPAN) 1000 MG CR tablet, atorvastatin (LIPITOR) 80 MG tablet  Osteopenia - stay off fosamax; continue Ca, D, weight bearing exercise. Repeat DEXA 12/2016  Medication monitoring encounter - Plan: Lipid panel, Comprehensive metabolic panel, CBC with Differential/Platelet, TSH   Discussed monthly self breast exams and yearly mammograms; at least 30 minutes of aerobic activity at least 5 days/week; proper sunscreen use reviewed; healthy diet, including goals of calcium and vitamin D intake and alcohol recommendations (less than or equal to 1 drink/day) reviewed; regular seatbelt use; changing batteries in smoke detectors. Immunization recommendations discussed--UTD. continue yearly flu shots; will need Prevnar-13 at age 42.  Colonoscopy recommendations reviewed--UTD, due again in 2020.   CBC, c-met, TSH, lipid, urine microalbumin today. In 6 months, if labs okay this time, will just need LFT's, glucose, A1c and TSH (won't need lipids or full c-met again if normal).  F/u 6 mos.

## 2015-01-20 LAB — CYTOLOGY - PAP

## 2015-01-20 LAB — TSH: TSH: 6.661 u[IU]/mL — ABNORMAL HIGH (ref 0.350–4.500)

## 2015-01-21 LAB — MICROALBUMIN / CREATININE URINE RATIO: CREATININE, URINE: 23.9 mg/dL

## 2015-01-22 ENCOUNTER — Encounter: Payer: Self-pay | Admitting: Family Medicine

## 2015-07-27 ENCOUNTER — Ambulatory Visit (INDEPENDENT_AMBULATORY_CARE_PROVIDER_SITE_OTHER): Payer: PRIVATE HEALTH INSURANCE | Admitting: Family Medicine

## 2015-07-27 ENCOUNTER — Encounter: Payer: Self-pay | Admitting: Family Medicine

## 2015-07-27 VITALS — BP 124/80 | HR 68 | Ht 63.0 in | Wt 119.2 lb

## 2015-07-27 DIAGNOSIS — E7800 Pure hypercholesterolemia, unspecified: Secondary | ICD-10-CM | POA: Diagnosis not present

## 2015-07-27 DIAGNOSIS — Z5181 Encounter for therapeutic drug level monitoring: Secondary | ICD-10-CM

## 2015-07-27 DIAGNOSIS — E039 Hypothyroidism, unspecified: Secondary | ICD-10-CM | POA: Diagnosis not present

## 2015-07-27 DIAGNOSIS — I10 Essential (primary) hypertension: Secondary | ICD-10-CM

## 2015-07-27 DIAGNOSIS — E119 Type 2 diabetes mellitus without complications: Secondary | ICD-10-CM

## 2015-07-27 LAB — COMPREHENSIVE METABOLIC PANEL
ALBUMIN: 3.7 g/dL (ref 3.6–5.1)
ALK PHOS: 52 U/L (ref 33–130)
ALT: 10 U/L (ref 6–29)
AST: 22 U/L (ref 10–35)
BUN: 11 mg/dL (ref 7–25)
CALCIUM: 9.3 mg/dL (ref 8.6–10.4)
CO2: 25 mmol/L (ref 20–31)
Chloride: 107 mmol/L (ref 98–110)
Creat: 0.77 mg/dL (ref 0.50–0.99)
GLUCOSE: 86 mg/dL (ref 65–99)
Potassium: 4.2 mmol/L (ref 3.5–5.3)
Sodium: 141 mmol/L (ref 135–146)
Total Bilirubin: 0.3 mg/dL (ref 0.2–1.2)
Total Protein: 6.5 g/dL (ref 6.1–8.1)

## 2015-07-27 LAB — TSH: TSH: 5.78 mIU/L — ABNORMAL HIGH

## 2015-07-27 LAB — POCT GLYCOSYLATED HEMOGLOBIN (HGB A1C): Hemoglobin A1C: 6

## 2015-07-27 MED ORDER — METFORMIN HCL 1000 MG PO TABS: 1000.0000 mg | ORAL_TABLET | Freq: Two times a day (BID) | ORAL | Status: DC

## 2015-07-27 NOTE — Progress Notes (Signed)
Chief Complaint  Patient presents with  . Hypertension    fasting med check. No concerns.   Hypothyroidism: Review of chart shows that she has been borderline on the 22mg dose, and over-replaced on the 8100m dose. Last check was 6 months ago, and TSH was 6.61, the time prior was 4.532. She denied any symptoms at that time, so we opted to continue the 7530mdose and recheck in 6 months, sooner if she developed symptoms. She continues to report normal energy, no changes in hair/skin/nails/bowels/weight. No missed doses; takes it separately from other medications and on an empty stomach.   Diabetes follow-up: Blood sugars at home are running around 80-100 in the mornings (checks 3x/week). 139 last night, otherwise she doesn't usually check it in the evenings.  Denies hypoglycemia. Denies polydipsia and polyuria. Eye exams are twice yearly, went in September and December 2016, next due in 12/2015. Patient follows a low sugar diet and checks feet regularly without concerns.  Denies numbness, tingling or pain in the feet.  Hypertension follow-up: Blood pressures are not checked elsewhere. Denies dizziness, headaches, chest pain, edema. Denies side effects of medications.   Hyperlipidemia follow-up: Patient is reportedly following a low-fat, low cholesterol diet. Compliant with medications and denies medication side effects. No flushing or myalgias.  Lipids have been at goal on this regimen. Lab Results  Component Value Date   CHOL 157 01/19/2015   HDL 77 01/19/2015   LDLCALC 72 01/19/2015   TRIG 41 01/19/2015   CHOLHDL 2.0 01/19/2015    Irregular exercise.  Plans to start walking more now that the weather is nice.  Osteopenia: She had DEXA in 05/2012, which showed T-1.3. She had been on Fosamax for many years (>5), so it was stopped after that DEXA. [facility], and T -1.5, 2.6% decline. Vitamin D level was normal at 55 in 05/2012.  PMH, PSH, SH reviewed, FH  updated  Outpatient Encounter Prescriptions as of 07/27/2015  Medication Sig  . aspirin 81 MG tablet Take 81 mg by mouth daily.    . aMarland Kitchenorvastatin (LIPITOR) 80 MG tablet Take 1 tablet (80 mg total) by mouth daily.  . Calcium Carbonate-Vitamin D (CALCIUM 600 + D PO) Take 1 tablet by mouth 2 (two) times daily.    . fish oil-omega-3 fatty acids 1000 MG capsule Take 1 g by mouth daily.   . lMarland Kitchenvothyroxine (SYNTHROID, LEVOTHROID) 75 MCG tablet Take 1 tablet (75 mcg total) by mouth daily before breakfast.  . lisinopril (PRINIVIL,ZESTRIL) 5 MG tablet Take 1 tablet (5 mg total) by mouth daily.  . metFORMIN (GLUCOPHAGE) 1000 MG tablet Take 1 tablet (1,000 mg total) by mouth 2 (two) times daily with a meal.  . niacin (NIASPAN) 1000 MG CR tablet take 1 tablet by mouth at bedtime  . [DISCONTINUED] metFORMIN (GLUCOPHAGE) 1000 MG tablet Take 1 tablet (1,000 mg total) by mouth 2 (two) times daily with a meal.   No facility-administered encounter medications on file as of 07/27/2015.   No Known Allergies  ROS: no fever, chills, URI or allergy symptoms, cough, shortness of breath, headaches, dizziness, numbness, tingling, chest pain, palpitations, nausea, vomiting, heartburn, bowel changes, urinary complaints, bleeding, bruising, rash, depression, joint pains or other concerns.  PHYSICAL EXAM: BP 124/80 mmHg  Pulse 68  Ht '5\' 3"'  (1.6 m)  Wt 119 lb 3.2 oz (54.069 kg)  BMI 21.12 kg/m2  Well developed, pleasant female in no distress, in good spirits HEENT: PERRL ,EOMI, conjunctiva and sclera  are clear, OP clear Neck: no lymphadenopathy, thyromegaly or mass, no carotid bruit Heart: regular rate and rhythm without murmur  Lungs: clear bilaterally  Back: no CVA or spinal tenderness  Abdomen: soft, nontender, no organomegaly or mass  Extremities: no edema, 2+ pulse.  Skin: no rash, normal sensation Psych: normal mood, affect, hygiene and grooming.  Neuro: alert and oriented. Cranial nerves intact.  Normal strength, sensation, gait. Lab Results  Component Value Date   HGBA1C 6.0 07/27/2015   ASSESSMENT/PLAN:  Type 2 diabetes mellitus without complication, without long-term current use of insulin ([place]) - well controlled - Plan: metFORMIN (GLUCOPHAGE) 1000 MG tablet  Diabetes mellitus without complication ([place]) - Plan: HgB A1c, Comprehensive metabolic panel  Hypothyroidism, unspecified hypothyroidism type - elevated TSH without symptoms; recheck. If >10, adjust dose - Plan: TSH  Essential hypertension, benign - controlled - Plan: Comprehensive metabolic panel  Pure hypercholesterolemia - at goal on current regimen per labs 6 months ago, continue - Plan: Comprehensive metabolic panel  Medication monitoring encounter - Plan: Comprehensive metabolic panel, TSH    TSH, c-met today Lipids yearly at physical   If TSH remains <10 (since no symptoms), continue at 75 mcg dose If higher, then adjust dose Needs refill after labs back.

## 2015-07-27 NOTE — Patient Instructions (Signed)
Continue your current medications. We will be in touch with your bloodwork results in the next few days. I will refill your thyroid medication after getting the labs back (need to wait and see if we are continuing the 75 mcg dose or if we need to increase it).

## 2015-07-28 MED ORDER — LEVOTHYROXINE SODIUM 75 MCG PO TABS: 75.0000 ug | ORAL_TABLET | Freq: Every day | ORAL | Status: DC

## 2015-12-31 LAB — HM DIABETES EYE EXAM

## 2016-01-07 ENCOUNTER — Encounter: Payer: Self-pay | Admitting: *Deleted

## 2016-01-15 ENCOUNTER — Encounter: Payer: Self-pay | Admitting: Family Medicine

## 2016-01-15 LAB — HM MAMMOGRAPHY

## 2016-01-26 ENCOUNTER — Other Ambulatory Visit: Payer: Self-pay | Admitting: Family Medicine

## 2016-01-26 DIAGNOSIS — I10 Essential (primary) hypertension: Secondary | ICD-10-CM

## 2016-01-26 DIAGNOSIS — E7800 Pure hypercholesterolemia, unspecified: Secondary | ICD-10-CM

## 2016-01-26 DIAGNOSIS — E119 Type 2 diabetes mellitus without complications: Secondary | ICD-10-CM

## 2016-01-26 DIAGNOSIS — E039 Hypothyroidism, unspecified: Secondary | ICD-10-CM

## 2016-01-26 NOTE — Telephone Encounter (Signed)
Left message for pt to call back  °

## 2016-02-10 ENCOUNTER — Ambulatory Visit (INDEPENDENT_AMBULATORY_CARE_PROVIDER_SITE_OTHER): Payer: PRIVATE HEALTH INSURANCE | Admitting: Family Medicine

## 2016-02-10 ENCOUNTER — Encounter: Payer: Self-pay | Admitting: Family Medicine

## 2016-02-10 VITALS — BP 142/88 | HR 84 | Temp 99.1°F | Ht 63.0 in | Wt 119.2 lb

## 2016-02-10 DIAGNOSIS — R319 Hematuria, unspecified: Secondary | ICD-10-CM | POA: Diagnosis not present

## 2016-02-10 DIAGNOSIS — N3091 Cystitis, unspecified with hematuria: Secondary | ICD-10-CM | POA: Diagnosis not present

## 2016-02-10 LAB — POCT URINALYSIS DIPSTICK
Bilirubin, UA: NEGATIVE
GLUCOSE UA: NEGATIVE
KETONES UA: NEGATIVE
Spec Grav, UA: 1.015
Urobilinogen, UA: 0.2
pH, UA: 8.5

## 2016-02-10 MED ORDER — CIPROFLOXACIN HCL 500 MG PO TABS: 500.0000 mg | ORAL_TABLET | Freq: Two times a day (BID) | ORAL | 0 refills | Status: DC

## 2016-02-10 NOTE — Patient Instructions (Addendum)
Drink plenty of fluids. Take the antibiotics as directed (twice daily for a week)--contact us if you have any problems/side effects from the medication. You should notice improvement in symptoms within 24-48 hours. If worsening symptoms (fever, vomiting, worsening pain, etc), please contact us. We will contact you when the urine culture results are back, to let you know if the antibiotic needs to be changed.  As we discussed--always wipe front to back. Clean prior to intercourse and void (go urinate) within 20 minutes after sex. If you have frequent infections, we may need to do more of an evaluation.    Urinary Tract Infection Urinary tract infections (UTIs) can develop anywhere along your urinary tract. Your urinary tract is your body's drainage system for removing wastes and extra water. Your urinary tract includes two kidneys, two ureters, a bladder, and a urethra. Your kidneys are a pair of bean-shaped organs. Each kidney is about the size of your fist. They are located below your ribs, one on each side of your spine. CAUSES Infections are caused by microbes, which are microscopic organisms, including fungi, viruses, and bacteria. These organisms are so small that they can only be seen through a microscope. Bacteria are the microbes that most commonly cause UTIs. SYMPTOMS  Symptoms of UTIs may vary by age and gender of the patient and by the location of the infection. Symptoms in young women typically include a frequent and intense urge to urinate and a painful, burning feeling in the bladder or urethra during urination. Older women and men are more likely to be tired, shaky, and weak and have muscle aches and abdominal pain. A fever may mean the infection is in your kidneys. Other symptoms of a kidney infection include pain in your back or sides below the ribs, nausea, and vomiting. DIAGNOSIS To diagnose a UTI, your caregiver will ask you about your symptoms. Your caregiver will also ask  you to provide a urine sample. The urine sample will be tested for bacteria and white blood cells. White blood cells are made by your body to help fight infection. TREATMENT  Typically, UTIs can be treated with medication. Because most UTIs are caused by a bacterial infection, they usually can be treated with the use of antibiotics. The choice of antibiotic and length of treatment depend on your symptoms and the type of bacteria causing your infection. HOME CARE INSTRUCTIONS  If you were prescribed antibiotics, take them exactly as your caregiver instructs you. Finish the medication even if you feel better after you have only taken some of the medication.  Drink enough water and fluids to keep your urine clear or pale yellow.  Avoid caffeine, tea, and carbonated beverages. They tend to irritate your bladder.  Empty your bladder often. Avoid holding urine for long periods of time.  Empty your bladder before and after sexual intercourse.  After a bowel movement, women should cleanse from front to back. Use each tissue only once. SEEK MEDICAL CARE IF:   You have back pain.  You develop a fever.  Your symptoms do not begin to resolve within 3 days. SEEK IMMEDIATE MEDICAL CARE IF:   You have severe back pain or lower abdominal pain.  You develop chills.  You have nausea or vomiting.  You have continued burning or discomfort with urination. MAKE SURE YOU:   Understand these instructions.  Will watch your condition.  Will get help right away if you are not doing well or get worse.   This information is not  intended to replace advice given to you by your health care provider. Make sure you discuss any questions you have with your health care provider.   Document Released: 01/05/2005 Document Revised: 12/17/2014 Document Reviewed:    05/06/2011 Elsevier Interactive Patient Education [employer].

## 2016-02-10 NOTE — Progress Notes (Signed)
Chief Complaint  Patient presents with  . Hematuria    noticed blood in urine this morning. No burning or pain. Has been going more frequently and having urgency.    This morning, when she voided at work, she saw blood in the urine.  She had to go back to the bathroom 2 additional times (frequency unusual for her), she noticed ongoing blood in the urine.  Denies pain with urination, no back or abdominal pain.  +urinary frequency and hematuria x 1 day.  No h/o prior bladder infections. +sexually active, last intercourse last night (not painful).  PMH, PSH, SH reviewed  Outpatient Encounter Prescriptions as of 02/10/2016  Medication Sig  . aspirin 81 MG tablet Take 81 mg by mouth daily.    Marland Kitchen atorvastatin (LIPITOR) 80 MG tablet take 1 tablet by mouth once daily  . Calcium Carbonate-Vitamin D (CALCIUM 600 + D PO) Take 1 tablet by mouth 2 (two) times daily.    . fish oil-omega-3 fatty acids 1000 MG capsule Take 1 g by mouth daily.   Marland Kitchen levothyroxine (SYNTHROID, LEVOTHROID) 75 MCG tablet take 1 tablet by mouth once daily BEFORE BREAKFAST  . lisinopril (PRINIVIL,ZESTRIL) 5 MG tablet take 1 tablet by mouth once daily  . metFORMIN (GLUCOPHAGE) 1000 MG tablet take 1 tablet by mouth twice a day with food  . niacin (NIASPAN) 1000 MG CR tablet take 1 tablet by mouth at bedtime  . ciprofloxacin (CIPRO) 500 MG tablet Take 1 tablet (500 mg total) by mouth 2 (two) times daily.   No facility-administered encounter medications on file as of 02/10/2016.    (cipro rx'd today, not prior to visit)  No Known Allergies  ROS: no fever (low grade noted in office), chills, headaches, dizziness, URI symptoms, chest pain, back pain, abdominal pain, nausea, vomiting, diarrhea, bleeding (other than in urine), bruising, rash, vaginal discharge or other complaints. See HPI.  PHYSICAL EXAM:  BP (!) 142/88 (BP Location: Left Arm, Patient Position: Sitting, Cuff Size: Normal)   Pulse 84   Temp 99.1 F (37.3 C)  (Tympanic)   Ht 5\' 3"  (1.6 m)   Wt 119 lb 3.2 oz (54.1 kg)   BMI 21.12 kg/m   Pleasant, mildly anxious female, in no acute distress HEENT: conjunctiva and sclera are clear Neck: no lymphadenopathy or mass Heart: regular rate and rhythm Lungs: clear bilaterally Back: no CVA tenderness Abdomen: soft, nontender, no mass Extremities: no edema Psych: normal mood (slightly anxious), affect, hygiene and grooming Neuro: alert and oriented, normal gait, strength, cranial nerves grossly intact  Urine dip: 3: leuks, 2+ blood, trace nitrite, 1+ protein   ASSESSMENT/PLAN:  Hematuria, unspecified type - Plan: POCT Urinalysis Dipstick  Hemorrhagic cystitis - Plan: Urine culture    Drink plenty of fluids. Take the antibiotics as directed (twice daily for a week)--contact us if you have any problems/side effects from the medication. You should notice improvement in symptoms within 24-48 hours. If worsening symptoms (fever, vomiting, worsening pain, etc), please contact us. We will contact you when the urine culture results are back, to let you know if the antibiotic needs to be changed.  As we discussed--always wipe front to back. Clean prior to intercourse and void (go urinate) within 20 minutes after sex. If you have frequent infections, we may need to do more of an evaluation.

## 2016-02-13 LAB — URINE CULTURE

## 2016-02-13 MED ORDER — NITROFURANTOIN MONOHYD MACRO 100 MG PO CAPS: 100.0000 mg | ORAL_CAPSULE | Freq: Two times a day (BID) | ORAL | 0 refills | Status: DC

## 2016-02-13 NOTE — Addendum Note (Signed)
Addended by: Rita Ohara on: 02/13/2016 10:12 PM   Modules accepted: Orders

## 2016-02-23 NOTE — Progress Notes (Signed)
Chief Complaint  Patient presents with  . Annual Exam    annual exam with pelvic, had pap last year. Just had eye exam in Sept with Dr Kathlen Mody. Did repeat UA from UTI not having any symptoms showed 1+ blood but did pick up second abx and is holding. No concerns.    Sierra Combs is a 65 y.o. female who presents for a complete physical.  She has the following concerns:  She had recent hemorrhagic cystitis.  Culture found that it was resistant to the cipro that was prescribed, but her symptoms had completely resolved.  Therefore, she did not switch to take the macrobid that she was supposed to change to. She had repeat urine checked today--see below  Hypothyroidism: Review of chart shows that she has been borderline on the 50mg dose, and over-replaced on the 854m dose. Last check was 6 months ago: Lab Results  Component Value Date   TSH 5.78 (H) 07/27/2015   She denied any symptoms at that time, so we opted to continue the 7566mdose and recheck in 6 months, sooner if she developed symptoms. She continues to report normal energy, no changes in hair/skin/nails/bowels/weight. No missed doses; takes it separately from other medications and on an empty stomach.  Due for recheck today.  Diabetes follow-up: Blood sugars at home are running around 88-100 in the mornings (checks 3x/week).  Denies hypoglycemia--only if she doesn't eat, if she gets busy. Denies polydipsia and polyuria. Eye exams are now once yearly (used to go twice a year), last was 12/2015. Patient follows a low sugar diet and checks feet regularly without concerns.  Denies numbness, tingling or pain in the feet. Due for A1c today  Lab Results  Component Value Date   HGBA1C 6.0 07/27/2015   Hypertension follow-up: Blood pressures are not checked elsewhere. Denies dizziness, headaches, chest pain, edema. Denies side effects of medications.   Hyperlipidemia follow-up: Patient is reportedly following a low-fat, low cholesterol  diet. Compliant with medications and denies medication side effects. No flushing or myalgias.  Lipids have been at goal on this regimen, last checked a year ago. Due for repeat today. Lab Results  Component Value Date   CHOL 157 01/19/2015   HDL 77 01/19/2015   LDLCALC 72 01/19/2015   TRIG 41 01/19/2015   CHOLHDL 2.0 01/19/2015    Osteopenia: She had DEXA in 05/2012, which showed T-1.3. She had been on Fosamax for many years (>5), so it was stopped after that DEXA. [facility], and T -1.5, 2.6% decline. Vitamin D level was normal at 55 in 05/2012.   Immunization History  Administered Date(s) Administered  . Influenza Split 02/10/2012  . Influenza-Unspecified 01/13/2015, 02/01/2016  . Pneumococcal Polysaccharide-23 05/04/2007  . Td 06/17/1997  . Tdap 05/04/2007, 12/17/2012  . Zoster 12/30/2013   gets flu shots at work yearly Last Pap smear: 01/2015; no high risk HPV detected Last mammogram: 01/2016 Last colonoscopy: 11/2013 with Dr. GanPenelope Coopue again 11/2018)  Last DEXA: 12/2014; T-1.5, recheck 2 years Dentist: a few years ago, plans to go ("on her list") --she said this the last 3 years also, but still hasn't gone.  Ultimately hopes to get all of her teeth pulled. Ophtho: recent, Dr. WeaKathlen Modyercise: No longer walking the stairs (used to walk 4 flights of stairs up and down (15 minutes) due to some knee pain/arthritis--just rarely. She admits to not getting regular exercise lately. She used to use the gym at work prior to  going home (rides the bicycle (with arms--pushing/pulling), and some dumbbells.) Knee pain is better since cortisone shot last year, but hasn't been back to regular exercise routine.  Other doctors caring for patient: Ophtho: Dr. Alben Spittle GI: Dr. Evette Cristal Ortho: can't recall who she saw, plans to see another in future, when needed Dentist: none  She does not having a Living Will or Healthcare power of attorney. Denies falls in the last year or  depression.   Past Medical History:  Diagnosis Date  . Breast cyst 3cm L breast  . Colonic polyp   . Diabetes mellitus   . Diverticulosis   . Fatty liver   . Hyperlipidemia small particles 6/06  . Hypertension   . Menopause 2002  . Osteopenia L hip  . Unspecified hypothyroidism 09/09/2010    Past Surgical History:  Procedure Laterality Date  . BREAST BIOPSY  7/06 L breast benign  . CESAREAN SECTION    . COLONOSCOPY  6/05, 09/2008, 11/2013   Dr. Evette Cristal    Social History   Social History  . Marital status: Married    Spouse name: N/A  . Number of children: 1  . Years of education: N/A   Occupational History  . housekeeping [employer]   Social History Main Topics  . Smoking status: Never Smoker  . Smokeless tobacco: Never Used  . Alcohol use No  . Drug use: No  . Sexual activity: Yes    Partners: Male   Other Topics Concern  . Not on file   Social History Narrative   Lives with husband, no pets.  Daughter lives in [place].  +passive tobacco exposure from her husband (and smokes inside)    Family History  Problem Relation Age of Onset  . Diabetes Mother   . Heart disease Mother   . Hyperlipidemia Mother   . Hypertension Mother   . Diabetes Father   . Heart disease Father   . Hyperlipidemia Father   . Hypertension Father   . Diabetes Sister   . Diabetes Brother     legs amputated  . Kidney disease Brother     on dialysis  . Diabetes Sister   . Cancer Neg Hx     Outpatient Encounter Prescriptions as of 02/25/2016  Medication Sig  . aspirin 81 MG tablet Take 81 mg by mouth daily.    Marland Kitchen atorvastatin (LIPITOR) 80 MG tablet take 1 tablet by mouth once daily  . Calcium Carbonate-Vitamin D (CALCIUM 600 + D PO) Take 1 tablet by mouth 2 (two) times daily.    . fish oil-omega-3 fatty acids 1000 MG capsule Take 1 g by mouth daily.   Marland Kitchen levothyroxine (SYNTHROID, LEVOTHROID) 75 MCG tablet take 1 tablet by mouth once daily BEFORE  BREAKFAST  . lisinopril (PRINIVIL,ZESTRIL) 5 MG tablet take 1 tablet by mouth once daily  . metFORMIN (GLUCOPHAGE) 1000 MG tablet take 1 tablet by mouth twice a day with food  . niacin (NIASPAN) 1000 MG CR tablet take 1 tablet by mouth at bedtime  . nitrofurantoin, macrocrystal-monohydrate, (MACROBID) 100 MG capsule Take 1 capsule (100 mg total) by mouth 2 (two) times daily. (Patient not taking: Reported on 02/25/2016)   No facility-administered encounter medications on file as of 02/25/2016.     No Known Allergies   ROS: The patient denies anorexia, fever, weight changes, headaches, vision changes, decreased hearing, ear pain, sore throat, breast concerns, chest pain, palpitations, dizziness, syncope, dyspnea on exertion, cough, swelling, nausea, vomiting, diarrhea, constipation, abdominal  pain, melena, hematochezia, indigestion/heartburn, hematuria (resolved), incontinence, dysuria, vaginal bleeding, discharge, odor or itch, genital lesions, numbness, tingling, weakness, tremor, suspicious skin lesions, depression, anxiety, abnormal bleeding/bruising or enlarged lymph nodes.  Some vaginal dryness (very mild). Denies hot flashes or night sweats. Some URI symptoms/congestion over the weekend, better now. Right knee pain, intermittently, worse when wearing certain shoes, and also with weather changes.. She saw an orthopedist when the right knee started swelling, and was diagnosed with arthritis (and to cut back on her stairs for exercise). She had a cortisone shot, and knee is much better. She continues to do well since the injection over a year ago.   PHYSICAL EXAM:  BP 124/78 (BP Location: Left Arm, Patient Position: Sitting, Cuff Size: Normal)   Pulse 92   Ht 5' 2.5" (1.588 m)   Wt 117 lb 9.6 oz (53.3 kg)   BMI 21.17 kg/m   General Appearance:  Alert, cooperative, no distress, appears stated age   Head:  Normocephalic, without obvious abnormality, atraumatic   Eyes:  PERRL,  conjunctiva/corneas clear, EOM's intact, fundi  benign   Ears:  Normal TM's and external ear canals (right partially obscured by cerumen)  Nose:  Nares normal, mucosa mildly edematous, no erythema, no drainage or sinus tenderness   Throat:  Lips, mucosa, and tongue normal; teeth in poor repair with some decay   Neck:  Supple, no lymphadenopathy; thyroid: no enlargement/tenderness/nodules; no carotid bruit or JVD   Back:  Spine nontender, no curvature, ROM normal, no CVA tenderness   Lungs:  Clear to auscultation bilaterally without wheezes, rales or ronchi; respirations unlabored   Chest Wall:  No tenderness or deformity   Heart:  Regular rate and rhythm, S1 and S2 normal, no murmur, rub  or gallop   Breast Exam:  No tenderness, masses, or nipple discharge or inversion. No axillary lymphadenopathy. Nontender, no masses..   Abdomen:  Soft, non-tender, nondistended, normoactive bowel sounds,  no masses, no hepatosplenomegaly; small, easily reducible umbilical hernia  Genitalia:  Normal external genitalia without lesions. Mild atrophic changes and vaginal dryness. BUS and vagina normal; no cervical motion tenderness. No abnormal vaginal discharge. Uterus and adnexa not enlarged, nontender, no masses. Pap not performed   Rectal:  Normal tone, no masses or tenderness; heme negative stool  Extremities:  No clubbing, cyanosis or edema. Normal diabetic foot exam.  Pulses:  2+ and symmetric all extremities   Skin:  Skin color, texture, turgor normal, no rashes or lesions.  Lymph nodes:  Cervical, supraclavicular, and axillary nodes normal   Neurologic:  CNII-XII intact, normal strength, sensation and gait; reflexes 2+ and symmetric throughout   Psych:  Normal mood, affect, hygiene and grooming   Lab Results  Component Value Date   HGBA1C 6.5 02/25/2016    ASSESSMENT/PLAN:  Annual physical exam - Plan: POCT Urinalysis Dipstick  Type 2  diabetes mellitus without complication, without long-term current use of insulin ([place]) - controlled; reviewed importance of regular eating to prevent hypoglycemia, proper diet - Plan: HgB A1c, Comprehensive metabolic panel, Microalbumin / creatinine urine ratio, metFORMIN (GLUCOPHAGE) 1000 MG tablet  Pure hypercholesterolemia - Plan: Lipid panel, atorvastatin (LIPITOR) 80 MG tablet, niacin (NIASPAN) 1000 MG CR tablet  Essential hypertension, benign - Plan: Comprehensive metabolic panel, lisinopril (PRINIVIL,ZESTRIL) 5 MG tablet  Osteopenia, unspecified location  Medication monitoring encounter - Plan: Lipid panel, Comprehensive metabolic panel, CBC with Differential/Platelet, TSH, Microalbumin / creatinine urine ratio  Hypothyroidism, unspecified type - euthyroid by history; won't plan to change dose  if remains borderline as in past - Plan: TSH, levothyroxine (SYNTHROID, LEVOTHROID) 75 MCG tablet  Urinary tract infection with hematuria, site unspecified - start macrobid as prescribed - Plan: POCT Urinalysis Dipstick  Immunization due - Plan: Pneumococcal conjugate vaccine 13-valent  Encounter for hepatitis C screening test for low risk patient - Plan: Hepatitis C antibody  Tooth decay - encouraged her to schedule appt with dentist   JSEGBTD-17 today  Paperwork given for living will and healthcare power of attorney--encouraged to complete.  Discussed monthly self breast exams and yearly mammograms; at least 30 minutes of aerobic activity at least 5 days/weekd and weight bearing exercise at least 2x/wk; proper sunscreen use reviewed; healthy diet, including goals of calcium and vitamin D intake and alcohol recommendations (less than or equal to 1 drink/day) reviewed; regular seatbelt use; changing batteries in smoke detectors. Immunization recommendations discussed--Prevnar-13 today; continue yearly flu shots.  Colonoscopy recommendations reviewed--UTD, due again in 2020. Repeat DEXA  12/2016  hemorrhagic cystitis--resolved.  Resistant to the cipro that was prescribed, didn't change to the macrodantin due to resolution of symptoms.  Urinalysis today shows some persistent leukocytes--concern for recurrent symptoms.  Pt advised to take the macrobid as previously prescribed.  CBC, c-met, TSH, lipid, hep C Ab, urine microalbumin today.

## 2016-02-25 ENCOUNTER — Other Ambulatory Visit: Payer: Self-pay | Admitting: Family Medicine

## 2016-02-25 ENCOUNTER — Encounter: Payer: Self-pay | Admitting: Family Medicine

## 2016-02-25 ENCOUNTER — Ambulatory Visit (INDEPENDENT_AMBULATORY_CARE_PROVIDER_SITE_OTHER): Payer: PRIVATE HEALTH INSURANCE | Admitting: Family Medicine

## 2016-02-25 VITALS — BP 124/78 | HR 92 | Ht 62.5 in | Wt 117.6 lb

## 2016-02-25 DIAGNOSIS — E119 Type 2 diabetes mellitus without complications: Secondary | ICD-10-CM

## 2016-02-25 DIAGNOSIS — I10 Essential (primary) hypertension: Secondary | ICD-10-CM

## 2016-02-25 DIAGNOSIS — Z1159 Encounter for screening for other viral diseases: Secondary | ICD-10-CM | POA: Diagnosis not present

## 2016-02-25 DIAGNOSIS — E7800 Pure hypercholesterolemia, unspecified: Secondary | ICD-10-CM | POA: Diagnosis not present

## 2016-02-25 DIAGNOSIS — M8580 Other specified disorders of bone density and structure, unspecified site: Secondary | ICD-10-CM

## 2016-02-25 DIAGNOSIS — Z5181 Encounter for therapeutic drug level monitoring: Secondary | ICD-10-CM | POA: Diagnosis not present

## 2016-02-25 DIAGNOSIS — E039 Hypothyroidism, unspecified: Secondary | ICD-10-CM

## 2016-02-25 DIAGNOSIS — K029 Dental caries, unspecified: Secondary | ICD-10-CM | POA: Diagnosis not present

## 2016-02-25 DIAGNOSIS — N390 Urinary tract infection, site not specified: Secondary | ICD-10-CM | POA: Diagnosis not present

## 2016-02-25 DIAGNOSIS — Z0000 Encounter for general adult medical examination without abnormal findings: Secondary | ICD-10-CM | POA: Diagnosis not present

## 2016-02-25 DIAGNOSIS — R319 Hematuria, unspecified: Secondary | ICD-10-CM | POA: Diagnosis not present

## 2016-02-25 DIAGNOSIS — Z23 Encounter for immunization: Secondary | ICD-10-CM

## 2016-02-25 LAB — COMPREHENSIVE METABOLIC PANEL
ALBUMIN: 3.5 g/dL — AB (ref 3.6–5.1)
ALK PHOS: 57 U/L (ref 33–130)
ALT: 14 U/L (ref 6–29)
AST: 23 U/L (ref 10–35)
BUN: 14 mg/dL (ref 7–25)
CHLORIDE: 103 mmol/L (ref 98–110)
CO2: 26 mmol/L (ref 20–31)
CREATININE: 0.82 mg/dL (ref 0.50–0.99)
Calcium: 9.5 mg/dL (ref 8.6–10.4)
Glucose, Bld: 102 mg/dL — ABNORMAL HIGH (ref 65–99)
POTASSIUM: 4.3 mmol/L (ref 3.5–5.3)
Sodium: 141 mmol/L (ref 135–146)
TOTAL PROTEIN: 7.2 g/dL (ref 6.1–8.1)
Total Bilirubin: 0.4 mg/dL (ref 0.2–1.2)

## 2016-02-25 LAB — LIPID PANEL
Cholesterol: 114 mg/dL (ref <200)
HDL: 41 mg/dL — AB (ref >50)
LDL CALC: 64 mg/dL (ref <100)
TRIGLYCERIDES: 46 mg/dL (ref <150)
Total CHOL/HDL Ratio: 2.8 Ratio (ref <5.0)
VLDL: 9 mg/dL (ref <30)

## 2016-02-25 LAB — CBC WITH DIFFERENTIAL/PLATELET
BASOS PCT: 0 %
Basophils Absolute: 0 cells/uL (ref 0–200)
EOS PCT: 1 %
Eosinophils Absolute: 86 cells/uL (ref 15–500)
HCT: 31.4 % — ABNORMAL LOW (ref 35.0–45.0)
Hemoglobin: 10.2 g/dL — ABNORMAL LOW (ref 11.7–15.5)
LYMPHS PCT: 35 %
Lymphs Abs: 3010 cells/uL (ref 850–3900)
MCH: 27.3 pg (ref 27.0–33.0)
MCHC: 32.5 g/dL (ref 32.0–36.0)
MCV: 84.2 fL (ref 80.0–100.0)
MONO ABS: 430 {cells}/uL (ref 200–950)
MONOS PCT: 5 %
MPV: 8.4 fL (ref 7.5–12.5)
NEUTROS PCT: 59 %
Neutro Abs: 5074 cells/uL (ref 1500–7800)
PLATELETS: 526 10*3/uL — AB (ref 140–400)
RBC: 3.73 MIL/uL — AB (ref 3.80–5.10)
RDW: 15.6 % — AB (ref 11.0–15.0)
WBC: 8.6 10*3/uL (ref 4.0–10.5)

## 2016-02-25 LAB — POCT GLYCOSYLATED HEMOGLOBIN (HGB A1C): HEMOGLOBIN A1C: 6.5

## 2016-02-25 LAB — POCT URINALYSIS DIPSTICK
Bilirubin, UA: NEGATIVE
Blood, UA: NEGATIVE
Glucose, UA: NEGATIVE
KETONES UA: NEGATIVE
NITRITE UA: NEGATIVE
PH UA: 6
PROTEIN UA: NEGATIVE
Spec Grav, UA: 1.025
UROBILINOGEN UA: NEGATIVE

## 2016-02-25 LAB — TSH: TSH: 10.03 m[IU]/L — AB

## 2016-02-25 MED ORDER — NIACIN ER (ANTIHYPERLIPIDEMIC) 1000 MG PO TBCR: 1000.0000 mg | EXTENDED_RELEASE_TABLET | Freq: Every day | ORAL | 5 refills | Status: DC

## 2016-02-25 MED ORDER — LISINOPRIL 5 MG PO TABS: 5.0000 mg | ORAL_TABLET | Freq: Every day | ORAL | 5 refills | Status: DC

## 2016-02-25 MED ORDER — ATORVASTATIN CALCIUM 80 MG PO TABS: 80.0000 mg | ORAL_TABLET | Freq: Every day | ORAL | 5 refills | Status: DC

## 2016-02-25 MED ORDER — METFORMIN HCL 1000 MG PO TABS: 1000.0000 mg | ORAL_TABLET | Freq: Two times a day (BID) | ORAL | 5 refills | Status: DC

## 2016-02-25 MED ORDER — LEVOTHYROXINE SODIUM 75 MCG PO TABS: ORAL_TABLET | ORAL | 5 refills | Status: DC

## 2016-02-25 NOTE — Patient Instructions (Signed)
  HEALTH MAINTENANCE RECOMMENDATIONS:  It is recommended that you get at least 30 minutes of aerobic exercise at least 5 days/week (for weight loss, you may need as much as 60-90 minutes). This can be any activity that gets your heart rate up. This can be divided in 10-15 minute intervals if needed, but try and build up your endurance at least once a week.  Weight bearing exercise is also recommended twice weekly.  Eat a healthy diet with lots of vegetables, fruits and fiber.  "Colorful" foods have a lot of vitamins (ie green vegetables, tomatoes, red peppers, etc).  Limit sweet tea, regular sodas and alcoholic beverages, all of which has a lot of calories and sugar.  Up to 1 alcoholic drink daily may be beneficial for women (unless trying to lose weight, watch sugars).  Drink a lot of water.  Calcium recommendations are 1200-1500 mg daily (1500 mg for postmenopausal women or women without ovaries), and vitamin D 1000 IU daily.  This should be obtained from diet and/or supplements (vitamins), and calcium should not be taken all at once, but in divided doses.  Monthly self breast exams and yearly mammograms for women over the age of 33 is recommended.  Sunscreen of at least SPF 30 should be used on all sun-exposed parts of the skin when outside between the hours of 10 am and 4 pm (not just when at beach or pool, but even with exercise, golf, tennis, and yard work!)  Use a sunscreen that says "broad spectrum" so it covers both UVA and UVB rays, and make sure to reapply every 1-2 hours.  Remember to change the batteries in your smoke detectors when changing your clock times in the spring and fall.  Use your seat belt every time you are in a car, and please drive safely and not be distracted with cell phones and texting while driving.  Please call and schedule a dental appointment. Be sure to get at least 150 minutes of aerobic exercise each week, even if getting in just 10-15 minute intervals (ie  walking halls at work, or using the bike at the gym at work). Avoid stairs to prevent recurrent of knee problem.  Take the macrobid (nitrofurantoin) that was prescribed for the bladder infection, as there is still some abnormal cells in the urine to suggest that the infection did not completely go away with the other antibiotic (and also knowing that the bacteria was resistant to that medication that you used).

## 2016-02-26 ENCOUNTER — Other Ambulatory Visit: Payer: Self-pay | Admitting: Family Medicine

## 2016-02-26 LAB — MICROALBUMIN / CREATININE URINE RATIO
CREATININE, URINE: 102 mg/dL (ref 20–320)
MICROALB UR: 5.6 mg/dL
Microalb Creat Ratio: 55 mcg/mg creat — ABNORMAL HIGH (ref <30)

## 2016-02-26 LAB — HEPATITIS C ANTIBODY: HCV Ab: NEGATIVE

## 2016-02-26 LAB — FERRITIN: FERRITIN: 40 ng/mL (ref 20–288)

## 2016-02-26 LAB — VITAMIN B12: Vitamin B-12: 547 pg/mL (ref 200–1100)

## 2016-02-26 LAB — IRON: IRON: 43 ug/dL — AB (ref 45–160)

## 2016-02-26 MED ORDER — LEVOTHYROXINE SODIUM 88 MCG PO TABS: 88.0000 ug | ORAL_TABLET | Freq: Every day | ORAL | 1 refills | Status: DC

## 2016-04-12 ENCOUNTER — Other Ambulatory Visit: Payer: PRIVATE HEALTH INSURANCE

## 2016-04-12 DIAGNOSIS — R7989 Other specified abnormal findings of blood chemistry: Secondary | ICD-10-CM

## 2016-04-13 LAB — TSH: TSH: 5.01 mIU/L — ABNORMAL HIGH

## 2016-04-14 ENCOUNTER — Other Ambulatory Visit: Payer: Self-pay | Admitting: *Deleted

## 2016-04-14 MED ORDER — LEVOTHYROXINE SODIUM 88 MCG PO TABS: 88.0000 ug | ORAL_TABLET | Freq: Every day | ORAL | 3 refills | Status: DC

## 2016-04-21 ENCOUNTER — Encounter: Payer: Self-pay | Admitting: Family Medicine

## 2016-04-21 ENCOUNTER — Ambulatory Visit (INDEPENDENT_AMBULATORY_CARE_PROVIDER_SITE_OTHER): Payer: PRIVATE HEALTH INSURANCE | Admitting: Family Medicine

## 2016-04-21 VITALS — BP 120/74 | HR 88 | Temp 101.0°F | Ht 62.5 in | Wt 115.0 lb

## 2016-04-21 DIAGNOSIS — R6883 Chills (without fever): Secondary | ICD-10-CM

## 2016-04-21 DIAGNOSIS — J069 Acute upper respiratory infection, unspecified: Secondary | ICD-10-CM

## 2016-04-21 DIAGNOSIS — R509 Fever, unspecified: Secondary | ICD-10-CM

## 2016-04-21 DIAGNOSIS — R059 Cough, unspecified: Secondary | ICD-10-CM

## 2016-04-21 DIAGNOSIS — R05 Cough: Secondary | ICD-10-CM | POA: Diagnosis not present

## 2016-04-21 LAB — POC INFLUENZA A&B (BINAX/QUICKVUE)
Influenza A, POC: NEGATIVE
Influenza B, POC: NEGATIVE

## 2016-04-21 MED ORDER — BENZONATATE 200 MG PO CAPS: 200.0000 mg | ORAL_CAPSULE | Freq: Three times a day (TID) | ORAL | 0 refills | Status: DC | PRN

## 2016-04-21 NOTE — Patient Instructions (Signed)
  Drink plenty of water. Use tylenol or ibuprofen as needed for fever and pain. Use the benzonatate as needed for cough. I recommend also using either Mucinex DM or Robitussin DM. Buy a thermometer to monitor your fevers. Call us Monday if you still have fever and ongoing cough--I likely will want to send you for a chest x-ray and see you later in the day for a recheck.

## 2016-04-21 NOTE — Progress Notes (Signed)
Chief Complaint  Patient presents with  . Cough    started Tuesday with flu like symptoms. Chills and bosy aches. Coughing so badly that her stomach is hurting.    2 days ago she started with sniffling, and coughing.  Yesterday she started with fever/chills (didn't check temp at home), only slight body aches.  Deep cough, kept her awake, nonproductive, hacking cough.  Denies shortness of breath. Some slight postnasal drainage. Denies sinus pain, headaches, ear pain.  No sick contacts.  She tried Nyquil, didn't help, and robitussin (can't recall which kind, different from what she used with prior illness, not currently helping).  Hasn't taken any tylenol/advil  Similar illness 4 weeks ago, completely resolved.  PMH, PSH, SH reviewed  Outpatient Encounter Prescriptions as of 04/21/2016  Medication Sig  . aspirin 81 MG tablet Take 81 mg by mouth daily.    Marland Kitchen atorvastatin (LIPITOR) 80 MG tablet Take 1 tablet (80 mg total) by mouth daily.  . Calcium Carbonate-Vitamin D (CALCIUM 600 + D PO) Take 1 tablet by mouth 2 (two) times daily.    . fish oil-omega-3 fatty acids 1000 MG capsule Take 1 g by mouth daily.   Marland Kitchen guaifenesin (ROBITUSSIN) 100 MG/5ML syrup Take 200 mg by mouth 3 (three) times daily as needed for cough.  . levothyroxine (SYNTHROID, LEVOTHROID) 88 MCG tablet Take 1 tablet (88 mcg total) by mouth daily.  Marland Kitchen lisinopril (PRINIVIL,ZESTRIL) 5 MG tablet Take 1 tablet (5 mg total) by mouth daily.  . metFORMIN (GLUCOPHAGE) 1000 MG tablet Take 1 tablet (1,000 mg total) by mouth 2 (two) times daily with a meal.  . niacin (NIASPAN) 1000 MG CR tablet Take 1 tablet (1,000 mg total) by mouth at bedtime.  . benzonatate (TESSALON) 200 MG capsule Take 1 capsule (200 mg total) by mouth 3 (three) times daily as needed.  . nitrofurantoin, macrocrystal-monohydrate, (MACROBID) 100 MG capsule   . [DISCONTINUED] nitrofurantoin, macrocrystal-monohydrate, (MACROBID) 100 MG capsule Take 1 capsule (100 mg total)  by mouth 2 (two) times daily. (Patient not taking: Reported on 02/25/2016)   No facility-administered encounter medications on file as of 04/21/2016.    No Known Allergies  ROS: no nausea, vomiting.  Slight diarrhea yesterday. Denies rashes. No headache, dizziness, chest pain, shortness of breath, bleeding, bruising, rash. See HPI  PHYSICAL EXAM:  BP 120/74 (BP Location: Left Arm, Patient Position: Sitting, Cuff Size: Normal)   Pulse 88   Temp (!) 101 F (38.3 C) (Tympanic)   Ht 5' 2.5" (1.588 m)   Wt 115 lb (52.2 kg)   BMI 20.70 kg/m   Well developed, mildly ill/tired-appearing female in no distress.  No coughing during visit.  Speaking easily, in full sentences HEENT: PERRL EOMI, conjunctiva and sclera clear. TM's and EAC's normal. Nasal mucosa is normal, sinuses nontender. OP is clear, no erythema, moist mucus membranes. Neck: mild shotty anterior cervical lymphadenopathy Heart: regular rate and rhythm Lungs: clear bilaterally. Good air movement, no wheezes, rales, ronchi Extremities: no edema Skin: normal turgor, no rash Neuro: alert and oriented, cranial nerves intact, normal gait, strength  Influenza A&B negative   ASSESSMENT/PLAN:  Acute upper respiratory infection - viral, flu-like syndrome.  Supportive measures reviewed. f/u and CXR if persistent fever/cough--call next week  Fever, unspecified fever cause - Plan: POC Influenza A&B (Binax test)  Chills - Plan: POC Influenza A&B (Binax test)  Cough - Plan: benzonatate (TESSALON) 200 MG capsule   Drink plenty of water. Use tylenol or ibuprofen as needed for fever  and pain. Use the benzonatate as needed for cough. I recommend also using either Mucinex DM or Robitussin DM. Buy a thermometer to monitor your fevers. Call us Monday if you still have fever and ongoing cough--I likely will want to send you for a chest x-ray and see you later in the day for a recheck.

## 2016-08-23 ENCOUNTER — Other Ambulatory Visit: Payer: Self-pay | Admitting: Family Medicine

## 2016-08-27 ENCOUNTER — Other Ambulatory Visit: Payer: Self-pay | Admitting: Family Medicine

## 2016-08-27 DIAGNOSIS — I10 Essential (primary) hypertension: Secondary | ICD-10-CM

## 2016-08-27 DIAGNOSIS — E7800 Pure hypercholesterolemia, unspecified: Secondary | ICD-10-CM

## 2016-08-27 DIAGNOSIS — E119 Type 2 diabetes mellitus without complications: Secondary | ICD-10-CM

## 2016-08-30 NOTE — Progress Notes (Signed)
Chief Complaint  Patient presents with  . Diabetes    fasting med check. No concerns.     Hypothyroidism: Dose was increased from 75 to 66mg in November after her TSH was 10 (she didn't have any symptoms).  Repeat testing in January on the 874m dose remained borderline at 5.  Since she didn't have any symptoms, we elected to continue the 8858mdose and plan to recheck today. She denies any symptoms--no changes in energy, hair/skin/nails/bowels.  Slight decrease in weight. Lab Results  Component Value Date   TSH 5.01 (H) 04/12/2016    Diabetes follow-up: Blood sugars at home are running around 90-115 in the mornings (checks 3x/week). Denies hypoglycemia (only if she gets so busy she forgets to eat), polydipsia and polyuria. Eye exams are now once yearly (used to go twice a year), last was 12/2015. Patient follows a low sugar diet and checks feet regularly without concerns. She is planning to see a podiatrist as she has been having some foot pain (heel).Denies numbness, tingling or pain in the feet. Due for A1c today Lab Results  Component Value Date   HGBA1C 6.5 02/25/2016   Microalbuminuria was noted at her November visit, ratio 55. She had UTI around that time, so would like to recheck today. Denies any urinary complaints.  Hypertension follow-up: Blood pressures are not checked elsewhere. Denies dizziness, headaches, chest pain, edema. Denies side effects of medications.   Hyperlipidemia follow-up: Patient is reportedly following a low-fat, low cholesterol diet. Compliant with medications (atorvastatin, niaspan and fish oil) and denies medication side effects. No flushing or myalgias.Lipids have been at goal on this regimen.   Lab Results  Component Value Date   CHOL 114 02/25/2016   HDL 41 (L) 02/25/2016   LDLCALC 64 02/25/2016   TRIG 46 02/25/2016   CHOLHDL 2.8 02/25/2016    Osteopenia: She had DEXA in 05/2012, which showed T-1.3. She had been on Fosamax for many years  (>5), so it was stopped after that DEXA. [facility], and T -1.5, 2.6% decline. Vitamin D level was normal at 55 in 05/2012. Due for DEXA 12/2016.  She is walking, but limited some by knee arthritis. Not getting much upper body weight-bearing exercise currently.  Anemia--noted to have anemia on November labs.  Iron levels were slightly low, normal ferritin, B12.  She had episode of hemorrhagic cystitis prior to noting that.  Due for recheck. Lab Results  Component Value Date   WBC 8.6 02/25/2016   HGB 10.2 (L) 02/25/2016   HCT 31.4 (L) 02/25/2016   MCV 84.2 02/25/2016   PLT 526 (H) 02/25/2016   Lab Results  Component Value Date   IRON 43 (L) 02/25/2016   FERRITIN 40 02/25/2016   Lab Results  Component Value Date   VITALPFXTKW40 973/16/2017    PMH, PSH, SH reviewed  Outpatient Encounter Prescriptions as of 08/31/2016  Medication Sig Note  . aspirin 81 MG tablet Take 81 mg by mouth daily.     . aMarland Kitchenorvastatin (LIPITOR) 80 MG tablet Take 1 tablet (80 mg total) by mouth daily.   . Calcium Carbonate-Vitamin D (CALCIUM 600 + D PO) Take 1 tablet by mouth 2 (two) times daily.     . fish oil-omega-3 fatty acids 1000 MG capsule Take 1 g by mouth daily.    . lMarland Kitchenvothyroxine (SYNTHROID, LEVOTHROID) 88 MCG tablet take 1 tablet by mouth once daily   . lisinopril (PRINIVIL,ZESTRIL) 5 MG tablet Take 1 tablet (5  mg total) by mouth daily.   . metFORMIN (GLUCOPHAGE) 1000 MG tablet Take 1 tablet (1,000 mg total) by mouth 2 (two) times daily with a meal.   . niacin (NIASPAN) 1000 MG CR tablet Take 1 tablet (1,000 mg total) by mouth at bedtime.   . [DISCONTINUED] benzonatate (TESSALON) 200 MG capsule Take 1 capsule (200 mg total) by mouth 3 (three) times daily as needed.   . [DISCONTINUED] guaifenesin (ROBITUSSIN) 100 MG/5ML syrup Take 200 mg by mouth 3 (three) times daily as needed for cough.   . [DISCONTINUED] nitrofurantoin, macrocrystal-monohydrate, (MACROBID) 100 MG capsule   04/21/2016: Received from: External Pharmacy   No facility-administered encounter medications on file as of 08/31/2016.     ROS: no fever, chills, URI or allergy symptoms, cough, shortness of breath, headaches, dizziness, numbness, tingling, chest pain, palpitations, nausea, vomiting, heartburn, bowel changes, urinary complaints, bleeding, bruising, rash, depression.  Some arthritis/pain in her right knee.   PHYSICAL EXAM:  BP 110/60 (BP Location: Left Arm, Patient Position: Sitting, Cuff Size: Normal)   Pulse 72   Ht 5' 2.5" (1.588 m)   Wt 113 lb 6.4 oz (51.4 kg)   BMI 20.41 kg/m   Wt Readings from Last 3 Encounters:  08/31/16 113 lb 6.4 oz (51.4 kg)  04/21/16 115 lb (52.2 kg)  02/25/16 117 lb 9.6 oz (53.3 kg)    Well developed, pleasant female in no distress, in good spirits HEENT: PERRL ,EOMI, conjunctiva and sclera are clear, OP clear Neck: no lymphadenopathy, thyromegaly or mass, no carotid bruit Heart: regular rate and rhythm without murmur  Lungs: clear bilaterally  Back: no CVA or spinal tenderness  Abdomen: soft, nontender, no organomegaly or mass  Extremities: no edema, 2+ pulse.  Skin: no rash, normal sensation Psych: normal mood, affect, hygiene and grooming.  Neuro: alert and oriented. Cranial nerves intact. Normal strength, sensation, gait.   Lab Results  Component Value Date   HGBA1C 5.8 08/31/2016    ASSESSMENT/PLAN:  Type 2 diabetes mellitus without complication, without long-term current use of insulin (HCC) - controlled on current regimen - Plan: HgB A1c, Comprehensive metabolic panel, Microalbumin / creatinine urine ratio, metFORMIN (GLUCOPHAGE) 1000 MG tablet  Essential hypertension, benign - controlled - Plan: Comprehensive metabolic panel, lisinopril (PRINIVIL,ZESTRIL) 5 MG tablet  Hypothyroidism, unspecified type - borderline TSH, but no symptoms.  last TSH 5; recheck today - Plan: TSH  Osteopenia, unspecified location - discussed  importance of weight-bearing exercise.  Continue Ca, D. Recheck DEXA in 12/2016  Pure hypercholesterolemia - Plan: Comprehensive metabolic panel, atorvastatin (LIPITOR) 80 MG tablet, niacin (NIASPAN) 1000 MG CR tablet  Medication monitoring encounter - Plan: Comprehensive metabolic panel, CBC with Differential/Platelet  Anemia, unspecified type - Plan: CBC with Differential/Platelet  Microalbuminuria - only noted at last check, when had UTI. Recheck today.  on ACEI - Plan: Microalbumin / creatinine urine ratio   [facility] 12/[facility]  CBC, c-met, TSH, urine Microalbumin  Shingrix discussed in detail--currently not on Medicare, return for [place] if covered/desired.  Discussed weight-bearing exercise in detail  F/u 6 mos (unsure if will be Medicare or not).

## 2016-08-31 ENCOUNTER — Ambulatory Visit (INDEPENDENT_AMBULATORY_CARE_PROVIDER_SITE_OTHER): Payer: PRIVATE HEALTH INSURANCE | Admitting: Family Medicine

## 2016-08-31 ENCOUNTER — Encounter: Payer: Self-pay | Admitting: Family Medicine

## 2016-08-31 ENCOUNTER — Other Ambulatory Visit: Payer: Self-pay | Admitting: Family Medicine

## 2016-08-31 VITALS — BP 110/60 | HR 72 | Ht 62.5 in | Wt 113.4 lb

## 2016-08-31 DIAGNOSIS — M8580 Other specified disorders of bone density and structure, unspecified site: Secondary | ICD-10-CM | POA: Diagnosis not present

## 2016-08-31 DIAGNOSIS — E039 Hypothyroidism, unspecified: Secondary | ICD-10-CM | POA: Diagnosis not present

## 2016-08-31 DIAGNOSIS — E119 Type 2 diabetes mellitus without complications: Secondary | ICD-10-CM

## 2016-08-31 DIAGNOSIS — I10 Essential (primary) hypertension: Secondary | ICD-10-CM

## 2016-08-31 DIAGNOSIS — R809 Proteinuria, unspecified: Secondary | ICD-10-CM

## 2016-08-31 DIAGNOSIS — Z5181 Encounter for therapeutic drug level monitoring: Secondary | ICD-10-CM

## 2016-08-31 DIAGNOSIS — D649 Anemia, unspecified: Secondary | ICD-10-CM

## 2016-08-31 DIAGNOSIS — E7800 Pure hypercholesterolemia, unspecified: Secondary | ICD-10-CM | POA: Diagnosis not present

## 2016-08-31 LAB — CBC WITH DIFFERENTIAL/PLATELET
BASOS ABS: 0 {cells}/uL (ref 0–200)
Basophils Relative: 0 %
EOS ABS: 67 {cells}/uL (ref 15–500)
Eosinophils Relative: 1 %
HCT: 32.9 % — ABNORMAL LOW (ref 35.0–45.0)
Hemoglobin: 10.5 g/dL — ABNORMAL LOW (ref 11.7–15.5)
LYMPHS PCT: 26 %
Lymphs Abs: 1742 cells/uL (ref 850–3900)
MCH: 27.2 pg (ref 27.0–33.0)
MCHC: 31.9 g/dL — AB (ref 32.0–36.0)
MCV: 85.2 fL (ref 80.0–100.0)
MONO ABS: 402 {cells}/uL (ref 200–950)
MONOS PCT: 6 %
MPV: 8.6 fL (ref 7.5–12.5)
NEUTROS PCT: 67 %
Neutro Abs: 4489 cells/uL (ref 1500–7800)
Platelets: 311 10*3/uL (ref 140–400)
RBC: 3.86 MIL/uL (ref 3.80–5.10)
RDW: 16.1 % — ABNORMAL HIGH (ref 11.0–15.0)
WBC: 6.7 10*3/uL (ref 4.0–10.5)

## 2016-08-31 LAB — COMPREHENSIVE METABOLIC PANEL
ALBUMIN: 3.8 g/dL (ref 3.6–5.1)
ALT: 10 U/L (ref 6–29)
AST: 20 U/L (ref 10–35)
Alkaline Phosphatase: 51 U/L (ref 33–130)
BILIRUBIN TOTAL: 0.4 mg/dL (ref 0.2–1.2)
BUN: 14 mg/dL (ref 7–25)
CO2: 25 mmol/L (ref 20–31)
CREATININE: 0.86 mg/dL (ref 0.50–0.99)
Calcium: 9.4 mg/dL (ref 8.6–10.4)
Chloride: 105 mmol/L (ref 98–110)
GLUCOSE: 91 mg/dL (ref 65–99)
Potassium: 4.3 mmol/L (ref 3.5–5.3)
SODIUM: 141 mmol/L (ref 135–146)
Total Protein: 6.6 g/dL (ref 6.1–8.1)

## 2016-08-31 LAB — POCT GLYCOSYLATED HEMOGLOBIN (HGB A1C): HEMOGLOBIN A1C: 5.8

## 2016-08-31 MED ORDER — ATORVASTATIN CALCIUM 80 MG PO TABS: 80.0000 mg | ORAL_TABLET | Freq: Every day | ORAL | 5 refills | Status: DC

## 2016-08-31 MED ORDER — NIACIN ER (ANTIHYPERLIPIDEMIC) 1000 MG PO TBCR: 1000.0000 mg | EXTENDED_RELEASE_TABLET | Freq: Every day | ORAL | 5 refills | Status: DC

## 2016-08-31 MED ORDER — LISINOPRIL 5 MG PO TABS: 5.0000 mg | ORAL_TABLET | Freq: Every day | ORAL | 5 refills | Status: DC

## 2016-08-31 MED ORDER — METFORMIN HCL 1000 MG PO TABS: 1000.0000 mg | ORAL_TABLET | Freq: Two times a day (BID) | ORAL | 5 refills | Status: DC

## 2016-08-31 NOTE — Patient Instructions (Addendum)
Continue your current medications.  Your blood pressure and diabetes are great. We will let you know about the thyroid when your test results are back (tomorrow).  All of your other medications have been refilled for 6 months.  Please add in weight-bearing exercise (upper and lower body, as we discussed) at least 2-3 times/week to help keep your bones strong.  You will be due for another bone density test in September.  I recommend getting the new shingles vaccine (Shingrix). You will need to check with your insurance to see if it is covered, and if covered by Medicare Part D, you need to get from the pharmacy rather than our office.  It is a series of 2 injections, spaced 2 months apart.

## 2016-09-01 LAB — TSH: TSH: 3.29 mIU/L

## 2016-09-01 LAB — FERRITIN: Ferritin: 7 ng/mL — ABNORMAL LOW (ref 20–288)

## 2016-09-01 LAB — MICROALBUMIN / CREATININE URINE RATIO
CREATININE, URINE: 115 mg/dL (ref 20–320)
MICROALB/CREAT RATIO: 5 ug/mg{creat} (ref <30)
Microalb, Ur: 0.6 mg/dL

## 2016-09-01 LAB — IRON AND TIBC
%SAT: 9 % — ABNORMAL LOW (ref 11–50)
IRON: 36 ug/dL — AB (ref 45–160)
TIBC: 379 ug/dL (ref 250–450)
UIBC: 343 ug/dL

## 2016-09-21 ENCOUNTER — Encounter: Payer: Self-pay | Admitting: *Deleted

## 2016-09-27 ENCOUNTER — Other Ambulatory Visit: Payer: Self-pay | Admitting: Family Medicine

## 2017-01-12 LAB — HM DIABETES EYE EXAM

## 2017-01-31 LAB — HM DEXA SCAN

## 2017-01-31 LAB — HM MAMMOGRAPHY

## 2017-02-08 ENCOUNTER — Encounter: Payer: Self-pay | Admitting: Podiatry

## 2017-02-08 ENCOUNTER — Ambulatory Visit (INDEPENDENT_AMBULATORY_CARE_PROVIDER_SITE_OTHER): Payer: PRIVATE HEALTH INSURANCE | Admitting: Podiatry

## 2017-02-08 VITALS — BP 155/83 | HR 98 | Ht 64.0 in | Wt 108.0 lb

## 2017-02-08 DIAGNOSIS — M216X1 Other acquired deformities of right foot: Secondary | ICD-10-CM

## 2017-02-08 DIAGNOSIS — S90211A Contusion of right great toe with damage to nail, initial encounter: Secondary | ICD-10-CM | POA: Diagnosis not present

## 2017-02-08 DIAGNOSIS — E119 Type 2 diabetes mellitus without complications: Secondary | ICD-10-CM

## 2017-02-08 DIAGNOSIS — M659 Synovitis and tenosynovitis, unspecified: Secondary | ICD-10-CM

## 2017-02-08 NOTE — Patient Instructions (Addendum)
Seen for left ankle pain and black streak on right great toe. Noted of tight Achilles tendon and lateral twisting of ankle joint. Ankle brace dispensed with instruction. Right great toe nail may grow out and resolve spontaneously. If needed will removed the affected area of nail. Return as needed.

## 2017-02-08 NOTE — Progress Notes (Signed)
SUBJECTIVE: 67 y.o. year old female presents complaining of discolored right great toe nail and left ankle pain duration of a few months. Patient works full time and on her feet all day. Blood sugar stays between 90 and 115.  Patient is referred by Dr. Tomi Bamberger.   Review of Systems  Constitutional: Negative.   HENT: Negative.   Eyes: Negative.   Respiratory: Negative.   Cardiovascular: Negative.   Gastrointestinal: Negative.   Genitourinary: Negative.   Musculoskeletal: Positive for joint pain.    OBJECTIVE: DERMATOLOGIC EXAMINATION: Nails: about 3 mm band of dark streak running from proximal skin folder to distal end of right great toe. No edema or erythema noted. No pain with pressure. VASCULAR EXAMINATION OF LOWER LIMBS: All pedal pulses are palpable with normal pulsation.  Capillary Filling times within 3 seconds in all digits.  Temperature gradient from tibial crest to dorsum of foot is within normal bilateral.  NEUROLOGIC EXAMINATION OF THE LOWER LIMBS: Achilles DTR is present and within normal. Monofilament (Semmes-Weinstein 10-gm) sensory testing positive 6 out of 6, bilateral. Vibratory sensations(128Hz  turning fork) intact at medial and lateral forefoot bilateral.  Sharp and Dull discriminatory sensations at the plantar ball of hallux is intact bilateral.   MUSCULOSKELETAL EXAMINATION: Positive for tight Achilles tendon bilateral, elevated first ray bilateral, STJ hyperpronation with medial bulge in left ankle.  Valgus rotated great toe bilateral. 2nd digit sits on top of lateral margin of great toe nail plate bilateral. Elevated first ray with excess pronation of STJ left foot.  Assessment: Type II diabetic without complication. Tenosynovitis right ankle. Achilles tendon contracture bilateral. Digital deformity bilateral.Subungual hematoma secondary to microtrauma from overlapping digit right hallux.  Plan: Reviewed findings and available treatment options,  reduce activity, ankle brace, periodic debridement of toe nail. Right foot placed in Ankle brace. Return as needed.

## 2017-02-09 ENCOUNTER — Encounter: Payer: Self-pay | Admitting: *Deleted

## 2017-02-21 ENCOUNTER — Other Ambulatory Visit: Payer: Self-pay | Admitting: Family Medicine

## 2017-02-21 DIAGNOSIS — E7800 Pure hypercholesterolemia, unspecified: Secondary | ICD-10-CM

## 2017-02-21 DIAGNOSIS — I10 Essential (primary) hypertension: Secondary | ICD-10-CM

## 2017-02-21 NOTE — Telephone Encounter (Signed)
Left message for pt to call back to find out if she has enough meds to get to her appointment next week with Dr. Tomi Bamberger

## 2017-02-22 NOTE — Telephone Encounter (Signed)
Left message for patient to call me back. 

## 2017-02-28 NOTE — Progress Notes (Signed)
Chief Complaint  Patient presents with  . Annual Exam    fasting annual exam with pelvic. Had eye exam with Dr.Hecker last week. Saw podiatrist recently. No concerns.     Sierra Combs is a 66 y.o. female who presents for a complete physical.    She recently saw podiatrist (02/08/17), diagnosed with tenosynovitis of the right ankle, and given ankle brace. This seems to help (isn't wearing it today).  Hypothyroidism: She is compliant with taking 73mg levothyroxine daily.  She takes it on an empty stomach, separate from food and other medications. She denies any symptoms--no changes in energy, hair/skin/nails/bowels.   +weight loss.  "I just don't eat like I used to".  Appetite is normal. Denies early satiety.  Sometimes when working, she may not eat (skips lunch). Lab Results  Component Value Date   TSH 3.29 08/31/2016  Prior TSH was 5, on the same dose.  Dose was increased from 711m a year ago.  Diabetes follow-up: Blood sugars at home are running around 90-105 in the mornings (checks 3x/week). It was 105 this morning. Denies hypoglycemia, polydipsia and polyuria. Eye exams are done yearly, last 01/2017. Patient follows a low sugar diet and checks feet regularly without concerns.Denies numbness, tingling or burning pain in the feet (just the ankle pain). Due for A1c today Lab Results  Component Value Date   HGBA1C 5.8 08/31/2016   Microalbuminuria was noted at her physical last year. She had UTI around that time; recheck in May 2018 was normal.  Hypertension follow-up: Blood pressures are not checked elsewhere. Denies dizziness, headaches, chest pain, edema. Denies side effects of medications.   Hyperlipidemia follow-up: Patient is reportedly following a low-fat, low cholesterol diet. Compliant with medications (atorvastatin, niaspan and fish oil) and denies medication side effects. No flushing or myalgias.Lipids have been at goal on this regimen.  Due for recheck today. Lab  Results  Component Value Date   CHOL 114 02/25/2016   HDL 41 (L) 02/25/2016   LDLCALC 64 02/25/2016   TRIG 46 02/25/2016   CHOLHDL 2.8 02/25/2016    Osteopenia: She had DEXA in 05/2012, which showed T-1.3. She had been on Fosamax for many years (>5), so it was stopped after that DEXA. [facility], and T -1.5, 2.6% decline.She had DEXA last checked 01/2017, overall stable.  T-1.7 at R fem neck, -1.6 spine (-7% change in spine, stable in other areas). Vitamin D level was normal at 55 in 05/2012.   Anemia--noted to have anemia at her physical last year.  Iron levels were slightly low, normal ferritin, B12.  She had episode of hemorrhagic cystitis prior to noting that. Labs were repeated in May.  She had a persistent anemia, and iron levels and iron stores are low. We were unable to contact her with her results--sent letters and called, but never heard back from her. She states she got a new phone and doesn't know how to check the messages, didn't recall seeing a letter.  At that time, the plan was to refer her back to her GI for evaluation of iron deficiency anemia. She had a normal colonoscopy in 2015, not due again until 2020, but may need re-eval and with upper endoscopy. Recheck today She denies any bleeding from anywhere, and no early satiety, heartburn, reflux, chest pain, changes in bowels, hematochezia, melena.  Lab Results  Component Value Date   WBC 6.7 08/31/2016   HGB 10.5 (L) 08/31/2016   HCT 32.9 (L) 08/31/2016  MCV 85.2 08/31/2016   PLT 311 08/31/2016   Lab Results  Component Value Date   IRON 36 (L) 08/31/2016   TIBC 379 08/31/2016   FERRITIN 7 (L) 08/31/2016   Chart reviewed:  Chest x-ray from 12/2011 showed:  IMPRESSION: 1.  No active lung disease.  Slight hyperaeration. 2.  Irregular soft tissue near the gastroesophageal junction. Cannot exclude soft tissue mass.  Correlate clinically and consider upper GI to assess further versus  endoscopy.  Immunization History  Administered Date(s) Administered  . Influenza Split 02/10/2012  . Influenza-Unspecified 01/13/2015, 02/01/2016  . Pneumococcal Conjugate-13 02/25/2016  . Pneumococcal Polysaccharide-23 05/04/2007  . Td 06/17/1997  . Tdap 05/04/2007, 12/17/2012  . Zoster 12/30/2013   She got her flu shots at work Last Pap smear: 01/2015; no high risk HPV detected Last mammogram: 01/2017 Last colonoscopy: 11/2013 with Dr. Penelope Coop (due again 11/2018)  Last DEXA: 01/2017, T-1.7 R fem neck, -1.6 spine Dentist: years ago, plans to go ("on her list") --she said this the last 4 years also, but still hasn't gone. She is now scheduled for January 31st.  Ophtho: recent, Dr. Kathlen Mody Exercise: She is moving a lot while standing, moves her feet when sitting, for a few minutes at a time, while at work.  On her days off she does an exercise program on [employer] for the elderly.  No longer walking the stairs due to some knee pain/arthritis.  She is no longer able to use the gym at work prior to going home (rides the bicycle (with arms--pushing/pulling), and some dumbbells.)    Other doctors caring for patient: Ophtho: Dr. Kathlen Mody GI: Dr. Penelope Coop Ortho: can't recall who she saw, plans to see another in future, when needed Dentist: scheduled (hasn't seen yet) Podiatrist: Dr. Caffie Pinto  She does not having a Living Will or Healthcare power of attorney. Given paperwork last year. "My daughter is going to handle all that". Hasn't filled out yet, but plans to. Denies falls in the last year or depression.  Past Medical History:  Diagnosis Date  . Breast cyst 3cm L breast  . Colonic polyp   . Diabetes mellitus   . Diverticulosis   . Fatty liver   . Hyperlipidemia small particles 6/06  . Hypertension   . Menopause 2002  . Osteopenia L hip  . Unspecified hypothyroidism 09/09/2010    Past Surgical History:  Procedure Laterality Date  . BREAST BIOPSY  7/06 L breast benign  . CESAREAN SECTION     . COLONOSCOPY  6/05, 09/2008, 11/2013   Dr. Penelope Coop    Social History   Socioeconomic History  . Marital status: Married    Spouse name: Not on file  . Number of children: 1  . Years of education: Not on file  . Highest education level: Not on file  Social Needs  . Financial resource strain: Not on file  . Food insecurity - worry: Not on file  . Food insecurity - inability: Not on file  . Transportation needs - medical: Not on file  . Transportation needs - non-medical: Not on file  Occupational History  . Occupation: housekeeping    Employer: [employer]  Tobacco Use  . Smoking status: Never Smoker  . Smokeless tobacco: Never Used  Substance and Sexual Activity  . Alcohol use: No  . Drug use: No  . Sexual activity: Yes    Partners: Male  Other Topics Concern  . Not on file  Social History Narrative   Lives with  husband, no pets.  Daughter lives in [place].  +passive tobacco exposure from her husband (and smokes inside)    Family History  Problem Relation Age of Onset  . Diabetes Mother   . Heart disease Mother   . Hyperlipidemia Mother   . Hypertension Mother   . Diabetes Father   . Heart disease Father   . Hyperlipidemia Father   . Hypertension Father   . Diabetes Sister   . Diabetes Brother        legs amputated  . Kidney disease Brother        on dialysis  . Diabetes Sister   . Lung cancer Sister        not smoker; +exposure from husband; works in Proofreader  . Cancer Sister        lung, stage 4    Outpatient Encounter Medications as of 03/01/2017  Medication Sig  . aspirin 81 MG tablet Take 81 mg by mouth daily.    Marland Kitchen atorvastatin (LIPITOR) 80 MG tablet take 1 tablet by mouth once daily  . Calcium Carbonate-Vitamin D (CALCIUM 600 + D PO) Take 1 tablet by mouth 2 (two) times daily.    . fish oil-omega-3 fatty acids 1000 MG capsule Take 1 g by mouth daily.   Marland Kitchen levothyroxine (SYNTHROID, LEVOTHROID) 88 MCG tablet take 1  tablet by mouth once daily  . lisinopril (PRINIVIL,ZESTRIL) 5 MG tablet take 1 tablet by mouth once daily  . metFORMIN (GLUCOPHAGE) 1000 MG tablet Take 1 tablet (1,000 mg total) by mouth 2 (two) times daily with a meal.  . niacin (NIASPAN) 1000 MG CR tablet Take 1 tablet (1,000 mg total) by mouth at bedtime.   No facility-administered encounter medications on file as of 03/01/2017.     No Known Allergies   ROS: The patient denies anorexia, fever, headaches, vision changes, decreased hearing, ear pain, sore throat, breast concerns, chest pain, palpitations, dizziness, syncope, dyspnea on exertion, cough, swelling, nausea, vomiting, diarrhea, constipation, abdominal pain, melena, hematochezia, indigestion/heartburn, hematuria, incontinence, dysuria, vaginal bleeding, discharge, odor or itch, genital lesions, numbness, tingling, weakness, tremor, suspicious skin lesions, depression, anxiety, abnormal bleeding/bruising or enlarged lymph nodes.  Some vaginal dryness (very mild). Denies hot flashes or night sweats. Right knee arthritis, intermittent pain, worse when wearing certain shoes, and also with weather changes..Previously treated with cortisone shot with good results, and overall is fine. 10# weight loss in the last year.   PHYSICAL EXAM:  BP (!) 148/90   Pulse 80   Ht 5' 2.5" (1.588 m)   Wt 107 lb 9.6 oz (48.8 kg)   BMI 19.37 kg/m    146/84 on repeat by MD  Wt Readings from Last 3 Encounters:  03/01/17 107 lb 9.6 oz (48.8 kg)  02/08/17 108 lb (49 kg)  08/31/16 113 lb 6.4 oz (51.4 kg)    General Appearance:  Alert, cooperative, no distress, appears stated age   Head:  Normocephalic, without obvious abnormality, atraumatic   Eyes:  PERRL, conjunctiva/corneas clear, EOM's intact, fundi benign   Ears:  Normal TM's and external ear canals  Nose:  Nares normal, mucosa mildly edematous, no erythema, no drainage or sinus tenderness   Throat:  Lips, mucosa, and tongue  normal; teeth in poor repair with some decay   Neck:  Supple, no lymphadenopathy; thyroid: no enlargement/tenderness/ nodules; no carotid bruit or JVD   Back:  Spine nontender, no curvature, ROM normal, no CVA tenderness   Lungs:  Clear to auscultation bilaterally without wheezes,  rales or ronchi; respirations unlabored   Chest Wall:  No tenderness or deformity   Heart:  Regular rate and rhythm, S1 and S2 normal, no murmur, rub or gallop   Breast Exam:  No tenderness, masses, or nipple discharge or inversion. No axillary lymphadenopathy. Nontender, no masses..   Abdomen:  Soft, non-tender, nondistended, normoactive bowel sounds, no masses, no hepatosplenomegaly; small, easily reducible umbilical hernia  Genitalia:  Normal external genitalia without lesions. Mild atrophic changes and vaginal dryness. BUS and vagina normal; no cervical motion tenderness. No abnormal vaginal discharge. Uterus and adnexa not enlarged, nontender, no masses. Pap not performed   Rectal:  Normal tone, no masses or tenderness; heme negative stool  Extremities:  No clubbing, cyanosis or edema.   Pulses:  2+ and symmetric all extremities   Skin:  Skin color, texture, turgor normal, no rashes or lesions.  Lymph nodes:  Cervical, supraclavicular, and axillary nodes normal   Neurologic:  CNII-XII intact, normal strength, sensation and gait; reflexes 2+ and symmetric throughout   Psych:  Normal mood, affect, hygiene and grooming  Normal diabetic foot exam.  Lab Results  Component Value Date   HGBA1C 6.0 03/01/2017    ASSESSMENT/PLAN:  Annual physical exam - Plan: POCT Urinalysis DIP (Proadvantage Device)  Type 2 diabetes mellitus without complication, without long-term current use of insulin (HCC) - well controlled - Plan: HgB A1c, Comprehensive metabolic panel  Essential hypertension, benign - elevated today. Advised to check elsewhere; regular exercise, low Na diet.  f/u 2 weeks; may need lisinopril dose increased - Plan: Comprehensive metabolic panel  Hypothyroidism, unspecified type - euthyroid by history. Recheck TSH given ongoing weight loss - Plan: TSH  Osteopenia, unspecified location  Pure hypercholesterolemia - due for recheck; continue lowfat, low cholesterol diet and current meds - Plan: Lipid panel  Iron deficiency anemia, unspecified iron deficiency anemia type - No known source of blood loss by history.  Recheck labs today. Will need GI eval.  Referral after labs back - Plan: CBC with Differential/Platelet, Ferritin, Iron  Medication monitoring encounter - Plan: Lipid panel, Comprehensive metabolic panel, CBC with Differential/Platelet, TSH  Immunization due - Plan: Pneumococcal polysaccharide vaccine 23-valent greater than or equal to 2yo subcutaneous/IM  Weight loss - inadequate caloric intake may contribute, but with unexplained IDA, very concerning for malignancy. Encouraged adequate oral intake - Plan: DG Chest 2 View  Abnormal finding on chest xray - possible soft tissue mass in 2013, now with weight loss and anemia. Recheck CXR today (also to eval for other abnls/LAD/CA, etc) - Plan: DG Chest 2 View   A1c CBC, c-met, TSH, lipid, iron/ferritin CXR today GI referral after labs back  Elevated blood pressure.  Reviewed low sodium diet and asked to check elsewhere. Goal <130/80  Discussed monthly self breast exams and yearly mammograms; at least 30 minutes of aerobic activity at least 5 days/week and weight bearing exercise at least 2x/wk; proper sunscreen use reviewed; healthy diet, including goals of calcium and vitamin D intake and alcohol recommendations (less than or equal to 1 drink/day) reviewed; regular seatbelt use; changing batteries in smoke detectors. Immunization recommendations discussed--Pneumovax today; continue yearly flu shots. Shingrix recommended, to check with insurance and set up [place].Colonoscopy recommendations  reviewed--UTD, due again in 2020. If ongoing weight loss and IDA, will need GI eval sooner. Await lab results.    F/u 2 weeks on blood pressure and labs.  meds refilled except for lisinopril--last filled 11/14 x 30 days, and she is returning to f/u  on her blood pressure in 2 weeks.  Will refill at f/u, given that dose may be changing.   ADDENDUM:  CXR and labs reviewed.  Iron deficiency anemia resolved--ferritin is still low (but higher), normal iron levels and hemoglobin. Will discuss in detail at f/u, but these are reassuring.

## 2017-03-01 ENCOUNTER — Ambulatory Visit
Admission: RE | Admit: 2017-03-01 | Discharge: 2017-03-01 | Disposition: A | Discharge status: Home / Self Care | Payer: PRIVATE HEALTH INSURANCE | Source: Ambulatory Visit | Attending: Family Medicine | Admitting: Family Medicine

## 2017-03-01 ENCOUNTER — Ambulatory Visit: Payer: PRIVATE HEALTH INSURANCE | Admitting: Family Medicine

## 2017-03-01 ENCOUNTER — Encounter: Payer: Self-pay | Admitting: Family Medicine

## 2017-03-01 VITALS — BP 146/84 | HR 80 | Ht 62.5 in | Wt 107.6 lb

## 2017-03-01 DIAGNOSIS — Z23 Encounter for immunization: Secondary | ICD-10-CM | POA: Diagnosis not present

## 2017-03-01 DIAGNOSIS — Z0000 Encounter for general adult medical examination without abnormal findings: Secondary | ICD-10-CM

## 2017-03-01 DIAGNOSIS — R9389 Abnormal findings on diagnostic imaging of other specified body structures: Secondary | ICD-10-CM

## 2017-03-01 DIAGNOSIS — R634 Abnormal weight loss: Secondary | ICD-10-CM

## 2017-03-01 DIAGNOSIS — E119 Type 2 diabetes mellitus without complications: Secondary | ICD-10-CM | POA: Diagnosis not present

## 2017-03-01 DIAGNOSIS — M8580 Other specified disorders of bone density and structure, unspecified site: Secondary | ICD-10-CM | POA: Diagnosis not present

## 2017-03-01 DIAGNOSIS — D509 Iron deficiency anemia, unspecified: Secondary | ICD-10-CM | POA: Diagnosis not present

## 2017-03-01 DIAGNOSIS — Z5181 Encounter for therapeutic drug level monitoring: Secondary | ICD-10-CM

## 2017-03-01 DIAGNOSIS — I10 Essential (primary) hypertension: Secondary | ICD-10-CM | POA: Diagnosis not present

## 2017-03-01 DIAGNOSIS — E039 Hypothyroidism, unspecified: Secondary | ICD-10-CM | POA: Diagnosis not present

## 2017-03-01 DIAGNOSIS — E7800 Pure hypercholesterolemia, unspecified: Secondary | ICD-10-CM

## 2017-03-01 LAB — CBC WITH DIFFERENTIAL/PLATELET
BASOS PCT: 0.6 %
Basophils Absolute: 42 cells/uL (ref 0–200)
EOS ABS: 63 {cells}/uL (ref 15–500)
Eosinophils Relative: 0.9 %
HCT: 36.6 % (ref 35.0–45.0)
HEMOGLOBIN: 12.3 g/dL (ref 11.7–15.5)
LYMPHS ABS: 1610 {cells}/uL (ref 850–3900)
MCH: 28.6 pg (ref 27.0–33.0)
MCHC: 33.6 g/dL (ref 32.0–36.0)
MCV: 85.1 fL (ref 80.0–100.0)
MONOS PCT: 4.4 %
MPV: 9.1 fL (ref 7.5–12.5)
NEUTROS ABS: 4977 {cells}/uL (ref 1500–7800)
Neutrophils Relative %: 71.1 %
Platelets: 373 10*3/uL (ref 140–400)
RBC: 4.3 10*6/uL (ref 3.80–5.10)
RDW: 14.1 % (ref 11.0–15.0)
Total Lymphocyte: 23 %
WBC: 7 10*3/uL (ref 3.8–10.8)
WBCMIX: 308 {cells}/uL (ref 200–950)

## 2017-03-01 LAB — COMPREHENSIVE METABOLIC PANEL
AG Ratio: 1.4 (calc) (ref 1.0–2.5)
ALT: 15 U/L (ref 6–29)
AST: 27 U/L (ref 10–35)
Albumin: 4.4 g/dL (ref 3.6–5.1)
Alkaline phosphatase (APISO): 62 U/L (ref 33–130)
BUN: 13 mg/dL (ref 7–25)
CO2: 28 mmol/L (ref 20–32)
CREATININE: 0.83 mg/dL (ref 0.50–0.99)
Calcium: 9.6 mg/dL (ref 8.6–10.4)
Chloride: 103 mmol/L (ref 98–110)
GLUCOSE: 95 mg/dL (ref 65–99)
Globulin: 3.1 g/dL (calc) (ref 1.9–3.7)
Potassium: 3.7 mmol/L (ref 3.5–5.3)
Sodium: 141 mmol/L (ref 135–146)
Total Bilirubin: 0.4 mg/dL (ref 0.2–1.2)
Total Protein: 7.5 g/dL (ref 6.1–8.1)

## 2017-03-01 LAB — POCT URINALYSIS DIP (PROADVANTAGE DEVICE)
Bilirubin, UA: NEGATIVE
Blood, UA: NEGATIVE
Glucose, UA: NEGATIVE mg/dL
Ketones, POC UA: NEGATIVE mg/dL
Leukocytes, UA: NEGATIVE
Nitrite, UA: NEGATIVE
Protein Ur, POC: NEGATIVE mg/dL
SPECIFIC GRAVITY, URINE: 1.025
UUROB: NEGATIVE
pH, UA: 6 (ref 5.0–8.0)

## 2017-03-01 LAB — LIPID PANEL
Cholesterol: 163 mg/dL (ref <200)
HDL: 85 mg/dL (ref >50)
LDL CHOLESTEROL (CALC): 66 mg/dL
NON-HDL CHOLESTEROL (CALC): 78 mg/dL (ref <130)
TRIGLYCERIDES: 41 mg/dL (ref <150)
Total CHOL/HDL Ratio: 1.9 (calc) (ref <5.0)

## 2017-03-01 LAB — POCT GLYCOSYLATED HEMOGLOBIN (HGB A1C): Hemoglobin A1C: 6

## 2017-03-01 LAB — FERRITIN: Ferritin: 12 ng/mL — ABNORMAL LOW (ref 20–288)

## 2017-03-01 LAB — IRON: Iron: 63 ug/dL (ref 45–160)

## 2017-03-01 LAB — TSH: TSH: 1.63 m[IU]/L (ref 0.40–4.50)

## 2017-03-01 NOTE — Patient Instructions (Addendum)
  HEALTH MAINTENANCE RECOMMENDATIONS:  It is recommended that you get at least 30 minutes of aerobic exercise at least 5 days/week (for weight loss, you may need as much as 60-90 minutes). This can be any activity that gets your heart rate up. This can be divided in 10-15 minute intervals if needed, but try and build up your endurance at least once a week.  Weight bearing exercise is also recommended twice weekly.  Eat a healthy diet with lots of vegetables, fruits and fiber.  "Colorful" foods have a lot of vitamins (ie green vegetables, tomatoes, red peppers, etc).  Limit sweet tea, regular sodas and alcoholic beverages, all of which has a lot of calories and sugar.  Up to 1 alcoholic drink daily may be beneficial for women (unless trying to lose weight, watch sugars).  Drink a lot of water.  Calcium recommendations are 1200-1500 mg daily (1500 mg for postmenopausal women or women without ovaries), and vitamin D 1000 IU daily.  This should be obtained from diet and/or supplements (vitamins), and calcium should not be taken all at once, but in divided doses.  Monthly self breast exams and yearly mammograms for women over the age of 8 is recommended.  Sunscreen of at least SPF 30 should be used on all sun-exposed parts of the skin when outside between the hours of 10 am and 4 pm (not just when at beach or pool, but even with exercise, golf, tennis, and yard work!)  Use a sunscreen that says "broad spectrum" so it covers both UVA and UVB rays, and make sure to reapply every 1-2 hours.  Remember to change the batteries in your smoke detectors when changing your clock times in the spring and fall. I also recommend carbon monoxide detectors for the home.  Use your seat belt every time you are in a car, and please drive safely and not be distracted with cell phones and texting while driving.  I recommend getting the new shingles vaccine (Shingrix). You will need to check with your insurance to see if it  is covered, and if covered by Medicare Part D, you need to get from the pharmacy rather than our office.  It is a series of 2 injections, spaced 2 months apart.  Your blood pressure was elevated today. Please try and check it periodically.  Goal is under 130/80.  If it is consistently higher than this, we need to increase your lisinopril dose.  Be sure to follow a low sodium diet (see below).  I'm concerned about your weight loss.  Partly may be due to inadequate intake on your part.  Be sure not to skip meals, and snack frequently.  I'm concerned about the iron deficiency anemia we have found.  We are rechecking this today.  Assuming that it is still present, we are referring you back to see Dr. Penelope Coop for further evaluation.  Go to [place] today ([address]) for a chest x-ray today.

## 2017-03-02 MED ORDER — LEVOTHYROXINE SODIUM 88 MCG PO TABS: 88.0000 ug | ORAL_TABLET | Freq: Every day | ORAL | 11 refills | Status: DC

## 2017-03-02 MED ORDER — METFORMIN HCL 1000 MG PO TABS: 1000.0000 mg | ORAL_TABLET | Freq: Two times a day (BID) | ORAL | 5 refills | Status: DC

## 2017-03-02 MED ORDER — NIACIN ER (ANTIHYPERLIPIDEMIC) 1000 MG PO TBCR: 1000.0000 mg | EXTENDED_RELEASE_TABLET | Freq: Every day | ORAL | 5 refills | Status: DC

## 2017-03-02 MED ORDER — ATORVASTATIN CALCIUM 80 MG PO TABS: 80.0000 mg | ORAL_TABLET | Freq: Every day | ORAL | 5 refills | Status: DC

## 2017-03-14 NOTE — Progress Notes (Signed)
Chief Complaint  Patient presents with  . Hypertension    2 week follow up on bp.     Patient presents for 2 week f/u on her blood pressure, and test results. She had been advised that chest X-ray was normal (no longer saw that soft tissue swelling), anemia has resolved (though iron stores are still on the low side, her iron levels and hemoglobin are now normal). Sugar, electrolytes, kidney, liver, cholesterol and thyroid tests were all normal. I sent in refills for her medications. We planned to review these in more detail at her visit today.  She was reminded to check BP elsewhere and record and bring list to appt.  BP's have been running 138-160/95-125. She just bought a new monitor on 12/1, wrist monitor, but did not bring it to be verified today.  She commented on "stressful day at work", but BP's were high in the morning as well.  She has some sinus headaches in the morning, better by after drinking her coffee.  Denies chest pain, dizziness. +packaged lunch meats. +crackers.  PMH, PSH, SH reviewed  Outpatient Encounter Medications as of 03/15/2017  Medication Sig  . aspirin 81 MG tablet Take 81 mg by mouth daily.    Marland Kitchen atorvastatin (LIPITOR) 80 MG tablet Take 1 tablet (80 mg total) by mouth daily.  . Calcium Carbonate-Vitamin D (CALCIUM 600 + D PO) Take 1 tablet by mouth 2 (two) times daily.    . fish oil-omega-3 fatty acids 1000 MG capsule Take 1 g by mouth daily.   Marland Kitchen levothyroxine (SYNTHROID, LEVOTHROID) 88 MCG tablet Take 1 tablet (88 mcg total) by mouth daily.  Marland Kitchen lisinopril (PRINIVIL,ZESTRIL) 20 MG tablet Take 1/2 tablet by mouth once daily. Increase to full tablet if BP's remain >130/80  . metFORMIN (GLUCOPHAGE) 1000 MG tablet Take 1 tablet (1,000 mg total) by mouth 2 (two) times daily with a meal.  . niacin (NIASPAN) 1000 MG CR tablet Take 1 tablet (1,000 mg total) by mouth at bedtime.  . [DISCONTINUED] lisinopril (PRINIVIL,ZESTRIL) 5 MG tablet take 1 tablet by mouth once daily    No facility-administered encounter medications on file as of 03/15/2017.    (taking 5mg  lisinopril prior to visit today)  No Known Allergies  ROS: no dizziness, chest pain, shortness of breath, nausea, vomiting, diarrhea ,numbness, tingling, weakness, edema, GI complaints. +work stress.  See HPI   PHYSICAL EXAM: BP 140/90   Pulse 92   Ht 5' 2.5" (1.588 m)   Wt 107 lb 3.2 oz (48.6 kg)   BMI 19.29 kg/m   Wt Readings from Last 3 Encounters:  03/15/17 107 lb 3.2 oz (48.6 kg)  03/01/17 107 lb 9.6 oz (48.8 kg)  02/08/17 108 lb (49 kg)   146/86 on repeat by MD  Well appearing, thin female, somewhat excitable, in no distress HEENT: PERRL, EOMI, conjunctiva clear. Neck: no lymphadenopathy or mass Heart regular rate and rhythm Lungs: clear bilaterally Abdomen: no bruit, nontender Extremities: no edema Neuro: alert and oriented, normal strength, gait  Lab Results  Component Value Date   WBC 7.0 03/01/2017   HGB 12.3 03/01/2017   HCT 36.6 03/01/2017   MCV 85.1 03/01/2017   PLT 373 03/01/2017   Lab Results  Component Value Date   IRON 63 03/01/2017   TIBC 379 08/31/2016   FERRITIN 12 (L) 03/01/2017   Lab Results  Component Value Date   TSH 1.63 03/01/2017   Lab Results  Component Value Date   CHOL 163 03/01/2017  HDL 85 03/01/2017   LDLCALC 64 02/25/2016   TRIG 41 03/01/2017   CHOLHDL 1.9 03/01/2017     Chemistry      Component Value Date/Time   NA 141 03/01/2017 0948   K 3.7 03/01/2017 0948   CL 103 03/01/2017 0948   CO2 28 03/01/2017 0948   BUN 13 03/01/2017 0948   CREATININE 0.83 03/01/2017 0948      Component Value Date/Time   CALCIUM 9.6 03/01/2017 0948   ALKPHOS 51 08/31/2016 0807   AST 27 03/01/2017 0948   ALT 15 03/01/2017 0948   BILITOT 0.4 03/01/2017 0948     Fasting glucose 95  Lab Results  Component Value Date   HGBA1C 6.0 03/01/2017   CXR from 03/01/17:  COMPARISON:  12/13/2011 FINDINGS: The heart size and mediastinal contours  are within normal limits. Stable mild ectasia of the thoracic aorta. Both lungs are clear. Previously noted soft tissue density near the gastroesophageal junction is no longer visualized on today's study. Stable mild thoracic spine degenerative changes. IMPRESSION: No active cardiopulmonary disease.  ASSESSMENT/PLAN:  Essential hypertension, benign - poorly controlled. Increase lisinopril to 20mg  (10mg  x 7-10d, then up to 20mg  if BP's still high). low sodium diet reviewed - Plan: lisinopril (PRINIVIL,ZESTRIL) 20 MG tablet   Please schedule a nurse visit where you bring your home blood pressure monitor--I want to verify the accuracy to know how trustworthy your home blood pressure values are.  We are increasing your lisinopril dose to a 20mg  tablet.  I want you to start at only 10mg  for 7-10 days (you can either take two of the 5mg  tablets that you have left, or take 1/2 tablet of the 20mg  new prescription).  If you blood pressure remains >130/80, then increase to the full 20mg  tablet after 7-10 days.  If your blood pressure is under 130/80 consistently, then you can stay at the 10mg  dose (and continue to cut the 20mg  tablets).  Return in 4 weeks with your list of blood pressures.  I want the monitor checked sooner rather than later, but feel free to bring it again to your visit. We will be drawing blood at your visit, you do NOT need to fast (okay to eat).  Try getting a low sodium [place], freshly sliced from the deli, rather than packaged meat. Avoid salt in crackers, nuts, etc.

## 2017-03-15 ENCOUNTER — Encounter: Payer: Self-pay | Admitting: Family Medicine

## 2017-03-15 ENCOUNTER — Ambulatory Visit: Payer: PRIVATE HEALTH INSURANCE | Admitting: Family Medicine

## 2017-03-15 DIAGNOSIS — I10 Essential (primary) hypertension: Secondary | ICD-10-CM

## 2017-03-15 MED ORDER — LISINOPRIL 20 MG PO TABS: ORAL_TABLET | ORAL | 1 refills | Status: DC

## 2017-03-15 NOTE — Patient Instructions (Signed)
Please schedule a nurse visit where you bring your home blood pressure monitor--I want to verify the accuracy to know how trustworthy your home blood pressure values are.  We are increasing your lisinopril dose to a 20mg  tablet.  I want you to start at only 10mg  for 7-10 days (you can either take two of the 5mg  tablets that you have left, or take 1/2 tablet of the 20mg  new prescription).  If you blood pressure remains >130/80, then increase to the full 20mg  tablet after 7-10 days.  If your blood pressure is under 130/80 consistently, then you can stay at the 10mg  dose (and continue to cut the 20mg  tablets).  Return in 4 weeks with your list of blood pressures.  I want the monitor checked sooner rather than later, but feel free to bring it again to your visit. We will be drawing blood at your visit, you do NOT need to fast (okay to eat).  Try getting a low sodium [place], freshly sliced from the deli, rather than packaged meat. Avoid salt in crackers, nuts, etc.    Low-Sodium Eating Plan Sodium, which is an element that makes up salt, helps you maintain a healthy balance of fluids in your body. Too much sodium can increase your blood pressure and cause fluid and waste to be held in your body. Your health care provider or dietitian may recommend following this plan if you have high blood pressure (hypertension), kidney disease, liver disease, or heart failure. Eating less sodium can help lower your blood pressure, reduce swelling, and protect your heart, liver, and kidneys. What are tips for following this plan? General guidelines  Most people on this plan should limit their sodium intake to 1,500-2,000 mg (milligrams) of sodium each day. Reading food labels  The Nutrition Facts label lists the amount of sodium in one serving of the food. If you eat more than one serving, you must multiply the listed amount of sodium by the number of servings.  Choose foods with less than 140 mg of sodium per  serving.  Avoid foods with 300 mg of sodium or more per serving. Shopping  Look for lower-sodium products, often labeled as "low-sodium" or "no salt added."  Always check the sodium content even if foods are labeled as "unsalted" or "no salt added".  Buy fresh foods. ? Avoid canned foods and premade or frozen meals. ? Avoid canned, cured, or processed meats  Buy breads that have less than 80 mg of sodium per slice. Cooking  Eat more home-cooked food and less restaurant, buffet, and fast food.  Avoid adding salt when cooking. Use salt-free seasonings or herbs instead of table salt or sea salt. Check with your health care provider or pharmacist before using salt substitutes.  Cook with plant-based oils, such as canola, sunflower, or olive oil. Meal planning  When eating at a restaurant, ask that your food be prepared with less salt or no salt, if possible.  Avoid foods that contain MSG (monosodium glutamate). MSG is sometimes added to [place] food, bouillon, and some canned foods. What foods are recommended? The items listed may not be a complete list. Talk with your dietitian about what dietary choices are best for you. Grains Low-sodium cereals, including oats, puffed wheat and rice, and shredded wheat. Low-sodium crackers. Unsalted rice. Unsalted pasta. Low-sodium bread. Whole-grain breads and whole-grain pasta. Vegetables Fresh or frozen vegetables. "No salt added" canned vegetables. "No salt added" tomato sauce and paste. Low-sodium or reduced-sodium tomato and vegetable juice. Fruits  Fresh, frozen, or canned fruit. Fruit juice. Meats and other protein foods Fresh or frozen (no salt added) meat, poultry, seafood, and fish. Low-sodium canned tuna and salmon. Unsalted nuts. Dried peas, beans, and lentils without added salt. Unsalted canned beans. Eggs. Unsalted nut butters. Dairy Milk. Soy milk. Cheese that is naturally low in sodium, such as ricotta cheese, fresh mozzarella, or  Swiss cheese Low-sodium or reduced-sodium cheese. Cream cheese. Yogurt. Fats and oils Unsalted butter. Unsalted margarine with no trans fat. Vegetable oils such as canola or olive oils. Seasonings and other foods Fresh and dried herbs and spices. Salt-free seasonings. Low-sodium mustard and ketchup. Sodium-free salad dressing. Sodium-free light mayonnaise. Fresh or refrigerated horseradish. Lemon juice. Vinegar. Homemade, reduced-sodium, or low-sodium soups. Unsalted popcorn and pretzels. Low-salt or salt-free chips. What foods are not recommended? The items listed may not be a complete list. Talk with your dietitian about what dietary choices are best for you. Grains Instant hot cereals. Bread stuffing, pancake, and biscuit mixes. Croutons. Seasoned rice or pasta mixes. Noodle soup cups. Boxed or frozen macaroni and cheese. Regular salted crackers. Self-rising flour. Vegetables Sauerkraut, pickled vegetables, and relishes. Olives. [place] fries. Onion rings. Regular canned vegetables (not low-sodium or reduced-sodium). Regular canned tomato sauce and paste (not low-sodium or reduced-sodium). Regular tomato and vegetable juice (not low-sodium or reduced-sodium). Frozen vegetables in sauces. Meats and other protein foods Meat or fish that is salted, canned, smoked, spiced, or pickled. Bacon, ham, sausage, hotdogs, corned beef, chipped beef, packaged lunch meats, salt pork, jerky, pickled herring, anchovies, regular canned tuna, sardines, salted nuts. Dairy Processed cheese and cheese spreads. Cheese curds. Blue cheese. Feta cheese. String cheese. Regular cottage cheese. Buttermilk. Canned milk. Fats and oils Salted butter. Regular margarine. Ghee. Bacon fat. Seasonings and other foods Onion salt, garlic salt, seasoned salt, table salt, and sea salt. Canned and packaged gravies. Worcestershire sauce. Tartar sauce. Barbecue sauce. Teriyaki sauce. Soy sauce, including reduced-sodium. Steak sauce. Fish  sauce. Oyster sauce. Cocktail sauce. Horseradish that you find on the shelf. Regular ketchup and mustard. Meat flavorings and tenderizers. Bouillon cubes. Hot sauce and Tabasco sauce. Premade or packaged marinades. Premade or packaged taco seasonings. Relishes. Regular salad dressings. Salsa. Potato and tortilla chips. Corn chips and puffs. Salted popcorn and pretzels. Canned or dried soups. Pizza. Frozen entrees and pot pies. Summary  Eating less sodium can help lower your blood pressure, reduce swelling, and protect your heart, liver, and kidneys.  Most people on this plan should limit their sodium intake to 1,500-2,000 mg (milligrams) of sodium each day.  Canned, boxed, and frozen foods are high in sodium. Restaurant foods, fast foods, and pizza are also very high in sodium. You also get sodium by adding salt to food.  Try to cook at home, eat more fresh fruits and vegetables, and eat less fast food, canned, processed, or prepared foods. This information is not intended to replace advice given to you by your health care provider. Make sure you discuss any questions you have with your health care provider. Document Released: 09/17/2001 Document Revised: 03/21/2016 Document Reviewed: 03/21/2016 Elsevier Interactive Patient Education  2017 [employer].

## 2017-04-07 ENCOUNTER — Other Ambulatory Visit: Payer: PRIVATE HEALTH INSURANCE

## 2017-04-07 ENCOUNTER — Telehealth: Payer: Self-pay

## 2017-04-07 NOTE — Telephone Encounter (Signed)
Pt came in for for nurse visit for b/p check with her her monitor , however the pt forgot her monitor.  Pt was not able go back home to get it because she had another appt if  Would have when home to get her monitor she would have been late to her appt.   I still check her b/p and it was 140/82  78pulse.  Pt has an appt with you next month  She said that will bring her monitor with her to her appt.

## 2017-04-25 NOTE — Progress Notes (Signed)
Chief Complaint  Patient presents with  . Hypertension    1 month follow up on blood pressure.     Patient presents for 1 month f/u on hypertension.  Her lisinopril dose was supposed to be titrated up from 77m to 246m She came for NV on 12/28 and BP was 140/82. Turns out she only was taking 1/2 tablet (1055m never increased to 55m11mse. She had forgotten to bring her BP monitor to verify the accuracy. She brings it in today.  She hasn't been checking BP very regularly, cannot recall any of the values. Her wrist monitor did have a memory--most values were 140-150's/90-110, some were much higher. There were 2 80's/30's also in the memory.  Some as high as 180/130 (rare). Denies having headaches or dizziness.   Taking only 10mg64m2 tablet) of lisinopril. She was told at her last visit: "We are increasing your lisinopril dose to a 55mg 10met.  I want you to start at only 10mg f81m-10 days (you can either take two of the 5mg tab18ms that you have left, or take 1/2 tablet of the 55mg new59mscription).  If you blood pressure remains >130/80, then increase to the full 55mg tabl33mfter 7-10 days.  If your blood pressure is under 130/80 consistently, then you can stay at the 10mg dose 71m continue to cut the 55mg tablet53m   Outpatient Encounter Medications as of 04/27/2017  Medication Sig  . aspirin 81 MG tablet Take 81 mg by mouth daily.    . atorvastatMarland Kitchenn (LIPITOR) 80 MG tablet Take 1 tablet (80 mg total) by mouth daily.  . Calcium Carbonate-Vitamin D (CALCIUM 600 + D PO) Take 1 tablet by mouth 2 (two) times daily.    . fish oil-omega-3 fatty acids 1000 MG capsule Take 1 g by mouth daily.   . levothyroxMarland Kitchenne (SYNTHROID, LEVOTHROID) 88 MCG tablet Take 1 tablet (88 mcg total) by mouth daily.  . lisinoprilMarland Kitchen(PRINIVIL,ZESTRIL) 20 MG tablet Take 1/2 tablet by mouth once daily. Increase to full tablet if BP's remain >130/80  . metFORMIN (GLUCOPHAGE) 1000 MG tablet Take 1 tablet (1,000 mg total) by  mouth 2 (two) times daily with a meal.  . niacin (NIASPAN) 1000 MG CR tablet Take 1 tablet (1,000 mg total) by mouth at bedtime.   No facility-administered encounter medications on file as of 04/27/2017.    No Known Allergies  ROS: no headaches, dizziness, chest pain, cough, shortness of breath, nausea, vomiting, diarrhea or other bowel changes, bleeding, bruising, rash, edema. Denies fever, chills, URI symptoms, urinary complaints or other concerns.   PHYSICAL EXAM:  PHYSICAL EXAM:  BP (!) 160/90   Pulse 88   Ht 5' 2.5" (1.588 m)   Wt 110 lb (49.9 kg)   BMI 19.80 kg/m   145/99 Left wrist when rechecked with her wrist monitor, with her wrist at heart level (she had been holding arm out when checked with nurse, and monitor got 156/105), pulse 103--states "my nerves are shot"/anxious. Recheck by MD in right arm 140/80, 138/84 on the left arm. She appears somewhat anxious/confused, but in good spirits. Thin/frail, though weight is stable/improved: Wt Readings from Last 3 Encounters:  04/27/17 110 lb (49.9 kg)  03/15/17 107 lb 3.2 oz (48.6 kg)  03/01/17 107 lb 9.6 oz (48.8 kg)   HEENT: conjunctiva and sclera are clear, EOMI Neck: no lymphadenopathy or mass Heart: regular rate and rhythm Lungs: clear bilaterally Extremities: no edema  ASSESSMENT/PLAN:  Essential hypertension, benign - above goal.  increase lisinopril to 94m.  Monitor regularly and bring in list of BP's to f/u. b-met at f/u. Shown proper way to use her monitor  Counseled re: proper way to take meds, how to use her BP wrist monitor most accurately, how to document/record. Reviewed low sodium diet.     Start taking FULL TABLET every day of the lisinopril (252m, stop cutting them in half.  Please monitor your blood pressure at least 3 times/week (if not daily). Record the values on the sheet you were given. If it is very high, try and relax, take deep breaths, and repeat 5 minutes later, and record both  values. Be sure to bring this list to your next appointment. Under the "comments" column you can record if you felt bad (headache, sick, anxious, forgot your medication, felt dizzy), anything that could account for why your blood pressure may be high or low, such as a high sodium meal, etc. Please continue to try and avoid high sodium foods in your diet. (see previous handout for low sodium foods).  Return in 1 month with the list of blood pressures, and plan to have blood drawn (it is okay to eat).

## 2017-04-27 ENCOUNTER — Ambulatory Visit: Payer: PRIVATE HEALTH INSURANCE | Admitting: Family Medicine

## 2017-04-27 ENCOUNTER — Encounter: Payer: Self-pay | Admitting: Family Medicine

## 2017-04-27 VITALS — BP 138/84 | HR 88 | Ht 62.5 in | Wt 110.0 lb

## 2017-04-27 DIAGNOSIS — I10 Essential (primary) hypertension: Secondary | ICD-10-CM

## 2017-04-27 NOTE — Patient Instructions (Addendum)
Start taking FULL TABLET every day of the lisinopril (20mg ), stop cutting them in half.  Please monitor your blood pressure at least 3 times/week (if not daily). Record the values on the sheet you were given. If it is very high, try and relax, take deep breaths, and repeat 5 minutes later, and record both values. Be sure to bring this list to your next appointment. Under the "comments" column you can record if you felt bad (headache, sick, anxious, forgot your medication, felt dizzy), anything that could account for why your blood pressure may be high or low, such as a high sodium meal, etc. Please continue to try and avoid high sodium foods in your diet. (see previous handout for low sodium foods).  Return in 1 month with the list of blood pressures, and plan to have blood drawn (it is okay to eat).

## 2017-05-16 ENCOUNTER — Other Ambulatory Visit: Payer: Self-pay | Admitting: Family Medicine

## 2017-05-16 DIAGNOSIS — I10 Essential (primary) hypertension: Secondary | ICD-10-CM

## 2017-05-16 NOTE — Telephone Encounter (Signed)
Pt has an appt 2/14

## 2017-05-23 NOTE — Progress Notes (Signed)
Chief Complaint  Patient presents with  . Hypertension    4 week follow up. Patient wants you to know that she went to the dentist Jan 31st!!   Patient presents for 1 month follow up on hypertension.  Her lisinopril dose was changed to 21m.  She is tolerating this without side effects. Her blood pressures have been running 119-135/81-90 in the mornings; 116-132/82-87 in the evenings. She reports we have previously verified her monitor as accurate.  She denies headaches, dizziness, chest pain, cough  PMH, PSH, SH reviewed  Outpatient Encounter Medications as of 05/25/2017  Medication Sig  . aspirin 81 MG tablet Take 81 mg by mouth daily.    .Marland Kitchenatorvastatin (LIPITOR) 80 MG tablet Take 1 tablet (80 mg total) by mouth daily.  . Calcium Carbonate-Vitamin D (CALCIUM 600 + D PO) Take 1 tablet by mouth 2 (two) times daily.    . fish oil-omega-3 fatty acids 1000 MG capsule Take 1 g by mouth daily.   .Marland Kitchenlevothyroxine (SYNTHROID, LEVOTHROID) 88 MCG tablet Take 1 tablet (88 mcg total) by mouth daily.  .Marland Kitchenlisinopril (PRINIVIL,ZESTRIL) 20 MG tablet 1 tablet daily  . metFORMIN (GLUCOPHAGE) 1000 MG tablet Take 1 tablet (1,000 mg total) by mouth 2 (two) times daily with a meal.  . niacin (NIASPAN) 1000 MG CR tablet Take 1 tablet (1,000 mg total) by mouth at bedtime.  . [DISCONTINUED] lisinopril (PRINIVIL,ZESTRIL) 20 MG tablet 1 tablet daily   No facility-administered encounter medications on file as of 05/25/2017.    No Known Allergies  ROS: no fever, chills, URI symptoms, headaches, dizziness, chest pain, cough, shortness of breath, swelling, GI or GU complaints. Moods are good.  PHYSICAL EXAM:  BP 140/90   Pulse 88   Ht 5' 2.5" (1.588 m)   Wt 109 lb 3.2 oz (49.5 kg)   BMI 19.65 kg/m   Well developed, pleasant, thin female in no distress Neck: no lymphadenopathy or mass Heart: regular rate and rhythm Lungs: clear bilaterally Extremities: no edema  ASSESSMENT/PLAN:   Medication monitoring  encounter - Plan: Basic metabolic panel  Essential hypertension, benign - BP's controlled per home numbers, likely some white coat component here. Cont 272mlisinopril. check b-met today - Plan: lisinopril (PRINIVIL,ZESTRIL) 20 MG tablet   F/u as previously scheduled, in June

## 2017-05-25 ENCOUNTER — Encounter: Payer: Self-pay | Admitting: Family Medicine

## 2017-05-25 ENCOUNTER — Ambulatory Visit: Payer: PRIVATE HEALTH INSURANCE | Admitting: Family Medicine

## 2017-05-25 VITALS — BP 140/90 | HR 88 | Ht 62.5 in | Wt 109.2 lb

## 2017-05-25 DIAGNOSIS — I10 Essential (primary) hypertension: Secondary | ICD-10-CM | POA: Diagnosis not present

## 2017-05-25 DIAGNOSIS — Z5181 Encounter for therapeutic drug level monitoring: Secondary | ICD-10-CM

## 2017-05-25 LAB — BASIC METABOLIC PANEL
BUN/Creatinine Ratio: 15 (ref 12–28)
BUN: 12 mg/dL (ref 8–27)
CALCIUM: 9.9 mg/dL (ref 8.7–10.3)
CO2: 24 mmol/L (ref 20–29)
CREATININE: 0.79 mg/dL (ref 0.57–1.00)
Chloride: 105 mmol/L (ref 96–106)
GFR calc Af Amer: 90 mL/min/{1.73_m2} (ref >59)
GFR, EST NON AFRICAN AMERICAN: 78 mL/min/{1.73_m2} (ref >59)
GLUCOSE: 76 mg/dL (ref 65–99)
POTASSIUM: 4 mmol/L (ref 3.5–5.2)
Sodium: 144 mmol/L (ref 134–144)

## 2017-05-25 MED ORDER — LISINOPRIL 20 MG PO TABS: ORAL_TABLET | ORAL | 5 refills | Status: DC

## 2017-05-25 NOTE — Patient Instructions (Signed)
Your blood pressure at home is much better.  Continue to take the 20mg  lisinopril. Continue low sodium diet. See you in June!

## 2017-05-26 ENCOUNTER — Encounter: Payer: Self-pay | Admitting: Family Medicine

## 2017-09-25 ENCOUNTER — Other Ambulatory Visit: Payer: Self-pay | Admitting: Family Medicine

## 2017-09-25 DIAGNOSIS — E119 Type 2 diabetes mellitus without complications: Secondary | ICD-10-CM

## 2017-09-25 DIAGNOSIS — E7800 Pure hypercholesterolemia, unspecified: Secondary | ICD-10-CM

## 2017-09-26 NOTE — Progress Notes (Signed)
Chief Complaint  Patient presents with  . Hypertension    fasting med check, patient will try to give UA for micro on way out as she could not give. No concerns.     Patient presents for follow-up on her multiple chronic medical concerns: Hypertension.  She continues on 67m of lisinopril, tolerating this without side effects. Hasn't been checking BP as regularly, sometimes sees it up, diastolic of 90. Hasn't been recording. Higher depending on what she eats. Ate mashed potatoes last night (pre-prepared). She reports we have previously verified her monitor as accurate. She denies dizziness, chest pain, cough.  She has a sinus headache today; denies getting regular headaches.    Hypothyroidism:She is compliant with taking 832m levothyroxine daily.  She takes it on an empty stomach, separate from food and other medications. She denies any symptoms--no changes in energy, hair/skin/nails/bowels.  Lab Results  Component Value Date   TSH 1.63 03/01/2017    Diabetes follow-up: Blood sugars at home are running 88-105, checking 3x/week.  No longer checks it in the evenings. Denies hypoglycemia,polydipsia and polyuria. Eye exams are done yearly, last 01/2017. Patient follows a low sugar diet and checks feet regularly without concerns.Denies numbness, tingling or burning pain in the feet (just the ankle pain).  Lab Results  Component Value Date   HGBA1C 6.0 03/01/2017   H/o microalbuminuria, noted when she had UTI; recheck in May 2018 was normal.   Hyperlipidemia follow-up: Patient is reportedly following a low-fat, low cholesterol diet. Compliant with medications(atorvastatin, niaspan and fish oil)and denies medication side effects. No flushing or myalgias.Lipids were at goal on last check on this regimen. Lab Results  Component Value Date   CHOL 163 03/01/2017   HDL 85 03/01/2017   LDLCALC 66 03/01/2017   TRIG 41 03/01/2017   CHOLHDL 1.9 03/01/2017    H/o anemia--noted to  have iron deficiency anemia in 02/2016, 08/2016.She had episode of hemorrhagic cystitis prior to first noting the anemia. Labs were repeated in May, 2018 and she had a persistent anemia, with low iron and iron stores.  She denied any ongoing bleeding.  Recheck at last visit was improved (hg normal, ferritin still low, iron normal).. She denies any bleeding from anywhere, and no early satiety, heartburn, reflux, chest pain, changes in bowels, hematochezia, melena.  Lab Results  Component Value Date   WBC 7.0 03/01/2017   HGB 12.3 03/01/2017   HCT 36.6 03/01/2017   MCV 85.1 03/01/2017   PLT 373 03/01/2017   Lab Results  Component Value Date   IRON 63 03/01/2017   TIBC 379 08/31/2016   FERRITIN 12 (L) 03/01/2017     PMH, PSH, SH reviewed  Outpatient Encounter Medications as of 09/27/2017  Medication Sig  . aspirin 81 MG tablet Take 81 mg by mouth daily.    . Marland Kitchentorvastatin (LIPITOR) 80 MG tablet Take 1 tablet (80 mg total) by mouth daily.  . Calcium Carbonate-Vitamin D (CALCIUM 600 + D PO) Take 1 tablet by mouth 2 (two) times daily.    . fish oil-omega-3 fatty acids 1000 MG capsule Take 1 g by mouth daily.   . Marland Kitchenevothyroxine (SYNTHROID, LEVOTHROID) 88 MCG tablet Take 1 tablet (88 mcg total) by mouth daily.  . Marland Kitchenisinopril (PRINIVIL,ZESTRIL) 20 MG tablet 1 tablet daily  . metFORMIN (GLUCOPHAGE) 1000 MG tablet Take 1 tablet (1,000 mg total) by mouth 2 (two) times daily with a meal.  . niacin (NIASPAN) 1000 MG CR tablet Take 1 tablet (1,000 mg total) by mouth  at bedtime.   No facility-administered encounter medications on file as of 09/27/2017.     ROS:  No fever, chills, URI symptoms, headaches (just today), dizziness, chest pain, palpitations, GI or GU complaints. Denies changes to hair/skin/nails/bowels/energy. Denies weight changes, normal appetite--admits to being busy and forgets to eat during the day. No bleeding, bruising, rash, joint pain or other concerns. Sinus headache today.   Doesn't usually have problems with allergies/sinuses.   PHYSICAL EXAM:  BP (!) 142/100   Pulse 96   Ht 5' 2.75" (1.594 m)   Wt 108 lb 6.4 oz (49.2 kg)   BMI 19.36 kg/m    134/84 on repeat by MD, RA  Wt Readings from Last 3 Encounters:  09/27/17 108 lb 6.4 oz (49.2 kg)  05/25/17 109 lb 3.2 oz (49.5 kg)  04/27/17 110 lb (49.9 kg)    Well developed, pleasant female in no distress, in good spirits HEENT: PERRL ,EOMI, conjunctiva and sclera are clear, OP clear. Sinuses are nontender, no nasal purulence/drainage. Neck: no lymphadenopathy, thyromegaly or mass, no carotid bruit Heart: regular rate and rhythm without murmur  Lungs: clear bilaterally  Back: no CVA or spinal tenderness  Abdomen: soft, nontender, no organomegaly or mass  Extremities: no edema, 2+ pulse.  Skin: no rash, normal sensation Psych: normal mood, affect, hygiene and grooming.  Neuro: alert and oriented. Cranial nerves intact. Normal strength, sensation, gait.   ASSESSMENT/PLAN:  Essential hypertension, benign - borderline today; reviewed low sodium diet. monitor regularly and drop off list within the month  Hypothyroidism, unspecified type - euthyroid by history, continue current dose - Plan: levothyroxine (SYNTHROID, LEVOTHROID) 88 MCG tablet  Iron deficiency anemia, unspecified iron deficiency anemia type - due for recheck; if persistent anemia, will need f/u with GI for further eval - Plan: Ferritin, CBC with Differential/Platelet  Type 2 diabetes mellitus without complication, without long-term current use of insulin (HCC) - Plan: HgB A1c, Microalbumin / creatinine urine ratio, Glucose, random, metFORMIN (GLUCOPHAGE) 1000 MG tablet  Medication monitoring encounter - Plan: Hepatic function panel  Pure hypercholesterolemia - At goal per last check; continue lowfat, low cholesterol diet and current meds - Plan: atorvastatin (LIPITOR) 80 MG tablet, niacin (NIASPAN) 1000 MG CR  tablet  Hypothyroidism, unspecified type - Plan: levothyroxine (SYNTHROID, LEVOTHROID) 88 MCG tablet  Type 2 diabetes mellitus without complication, without long-term current use of insulin (HCC) - well controlled - Plan: HgB A1c, Microalbumin / creatinine urine ratio, Glucose, random, metFORMIN (GLUCOPHAGE) 1000 MG tablet  Weight stable overall; borderline underweight. Counseled re: not skipping meals.  A1c LFT's, glu CBC, ferritin, c-met, urine microalb TSH if sx No need for lipids  shingrix recommended  F/u for CPE as scheduled in November    Please check your blood pressure more regularly over the next few weeks.  Record it on the sheet provided and return it to [place] within the month. This will help [place] know if we need to make any further changes in your blood pressure medication.  Try and avoid decongestants ("sinus" meds, including phenylephrine and pseudoephedrine) as these can raise your blood pressure.  Coricidin HBP and sinus rinses may be better options.  Please try and bring some healthy snacks to eat throughout the day when you get a chance. Consider protein snacks (unsalted nuts, peanut butter--not the packaged crackers that have too much salt), consider meal replacement bars or shakes--even if you can't tolerate them every day, a few times/week when working should help maintain your weight and energy.

## 2017-09-27 ENCOUNTER — Encounter: Payer: Self-pay | Admitting: Family Medicine

## 2017-09-27 ENCOUNTER — Ambulatory Visit: Payer: PRIVATE HEALTH INSURANCE | Admitting: Family Medicine

## 2017-09-27 VITALS — BP 134/84 | HR 96 | Ht 62.75 in | Wt 108.4 lb

## 2017-09-27 DIAGNOSIS — D509 Iron deficiency anemia, unspecified: Secondary | ICD-10-CM | POA: Diagnosis not present

## 2017-09-27 DIAGNOSIS — E119 Type 2 diabetes mellitus without complications: Secondary | ICD-10-CM

## 2017-09-27 DIAGNOSIS — I10 Essential (primary) hypertension: Secondary | ICD-10-CM | POA: Diagnosis not present

## 2017-09-27 DIAGNOSIS — Z5181 Encounter for therapeutic drug level monitoring: Secondary | ICD-10-CM

## 2017-09-27 DIAGNOSIS — E7800 Pure hypercholesterolemia, unspecified: Secondary | ICD-10-CM

## 2017-09-27 DIAGNOSIS — E039 Hypothyroidism, unspecified: Secondary | ICD-10-CM | POA: Diagnosis not present

## 2017-09-27 LAB — POCT GLYCOSYLATED HEMOGLOBIN (HGB A1C): Hemoglobin A1C: 5.9 % — AB (ref 4.0–5.6)

## 2017-09-27 MED ORDER — METFORMIN HCL 1000 MG PO TABS: 1000.0000 mg | ORAL_TABLET | Freq: Two times a day (BID) | ORAL | 5 refills | Status: DC

## 2017-09-27 MED ORDER — ATORVASTATIN CALCIUM 80 MG PO TABS: 80.0000 mg | ORAL_TABLET | Freq: Every day | ORAL | 5 refills | Status: DC

## 2017-09-27 MED ORDER — LEVOTHYROXINE SODIUM 88 MCG PO TABS: 88.0000 ug | ORAL_TABLET | Freq: Every day | ORAL | 11 refills | Status: DC

## 2017-09-27 MED ORDER — NIACIN ER (ANTIHYPERLIPIDEMIC) 1000 MG PO TBCR: 1000.0000 mg | EXTENDED_RELEASE_TABLET | Freq: Every day | ORAL | 5 refills | Status: DC

## 2017-09-27 NOTE — Patient Instructions (Addendum)
  Please check your blood pressure more regularly over the next few weeks.  Record it on the sheet provided and return it to [place] within the month. This will help [place] know if we need to make any further changes in your blood pressure medication.  Try and avoid decongestants ("sinus" meds, including phenylephrine and pseudoephedrine) as these can raise your blood pressure.  Coricidin HBP and sinus rinses may be better options.  Please try and bring some healthy snacks to eat throughout the day when you get a chance. Consider protein snacks (unsalted nuts, peanut butter--not the packaged crackers that have too much salt), consider meal replacement bars or shakes--even if you can't tolerate them every day, a few times/week when working should help maintain your weight and energy.  I recommend getting the new shingles vaccine (Shingrix). You will need to check with your insurance to see if it is covered, and if covered by Medicare Part D, you need to get from the pharmacy rather than our office.  It is a series of 2 injections, spaced 2 months apart.

## 2017-09-28 LAB — CBC WITH DIFFERENTIAL/PLATELET
Basophils Absolute: 0 10*3/uL (ref 0.0–0.2)
Basos: 1 %
EOS (ABSOLUTE): 0.3 10*3/uL (ref 0.0–0.4)
EOS: 4 %
HEMATOCRIT: 37 % (ref 34.0–46.6)
HEMOGLOBIN: 12.3 g/dL (ref 11.1–15.9)
IMMATURE GRANULOCYTES: 0 %
Immature Grans (Abs): 0 10*3/uL (ref 0.0–0.1)
Lymphocytes Absolute: 1.9 10*3/uL (ref 0.7–3.1)
Lymphs: 31 %
MCH: 29.3 pg (ref 26.6–33.0)
MCHC: 33.2 g/dL (ref 31.5–35.7)
MCV: 88 fL (ref 79–97)
MONOCYTES: 6 %
Monocytes Absolute: 0.4 10*3/uL (ref 0.1–0.9)
NEUTROS PCT: 58 %
Neutrophils Absolute: 3.5 10*3/uL (ref 1.4–7.0)
Platelets: 317 10*3/uL (ref 150–450)
RBC: 4.2 x10E6/uL (ref 3.77–5.28)
RDW: 14 % (ref 12.3–15.4)
WBC: 6.1 10*3/uL (ref 3.4–10.8)

## 2017-09-28 LAB — MICROALBUMIN / CREATININE URINE RATIO: Creatinine, Urine: 26 mg/dL

## 2017-09-28 LAB — HEPATIC FUNCTION PANEL
ALBUMIN: 4.2 g/dL (ref 3.6–4.8)
ALT: 14 IU/L (ref 0–32)
AST: 22 IU/L (ref 0–40)
Alkaline Phosphatase: 59 IU/L (ref 39–117)
BILIRUBIN TOTAL: 0.3 mg/dL (ref 0.0–1.2)
Bilirubin, Direct: 0.09 mg/dL (ref 0.00–0.40)
TOTAL PROTEIN: 6.9 g/dL (ref 6.0–8.5)

## 2017-09-28 LAB — FERRITIN: Ferritin: 20 ng/mL (ref 15–150)

## 2017-09-28 LAB — GLUCOSE, RANDOM: Glucose: 107 mg/dL — ABNORMAL HIGH (ref 65–99)

## 2017-09-29 ENCOUNTER — Encounter: Payer: Self-pay | Admitting: Family Medicine

## 2017-11-08 ENCOUNTER — Telehealth: Payer: Self-pay | Admitting: Family Medicine

## 2017-11-08 NOTE — Telephone Encounter (Signed)
Pt came in and dropped bp log. Sending back for review.

## 2017-11-12 NOTE — Telephone Encounter (Signed)
Morning BP's 108-124/73-85. Evening BP's 120-130/74-85.  Please advise pt that her blood pressure log was reviewed, and BP's are excellent. Continue current medication regimen, low sodium diet.

## 2017-11-13 NOTE — Telephone Encounter (Signed)
Left message for patient to call back  

## 2017-11-13 NOTE — Telephone Encounter (Signed)
Patient notified of recommendations. 

## 2017-12-22 ENCOUNTER — Other Ambulatory Visit: Payer: Self-pay | Admitting: Family Medicine

## 2017-12-22 DIAGNOSIS — I10 Essential (primary) hypertension: Secondary | ICD-10-CM

## 2018-01-16 LAB — HM DIABETES EYE EXAM

## 2018-01-25 IMAGING — DX DG CHEST 2V
2 series · 2 of 2 positions shown · non-contrast
Comparison: 12/13/2011

CLINICAL DATA: 15 pound weight loss over past year. Iron deficiency
anemia.

EXAM:
CHEST  2 VIEW

[dg chest 2 view (1 of 2)]
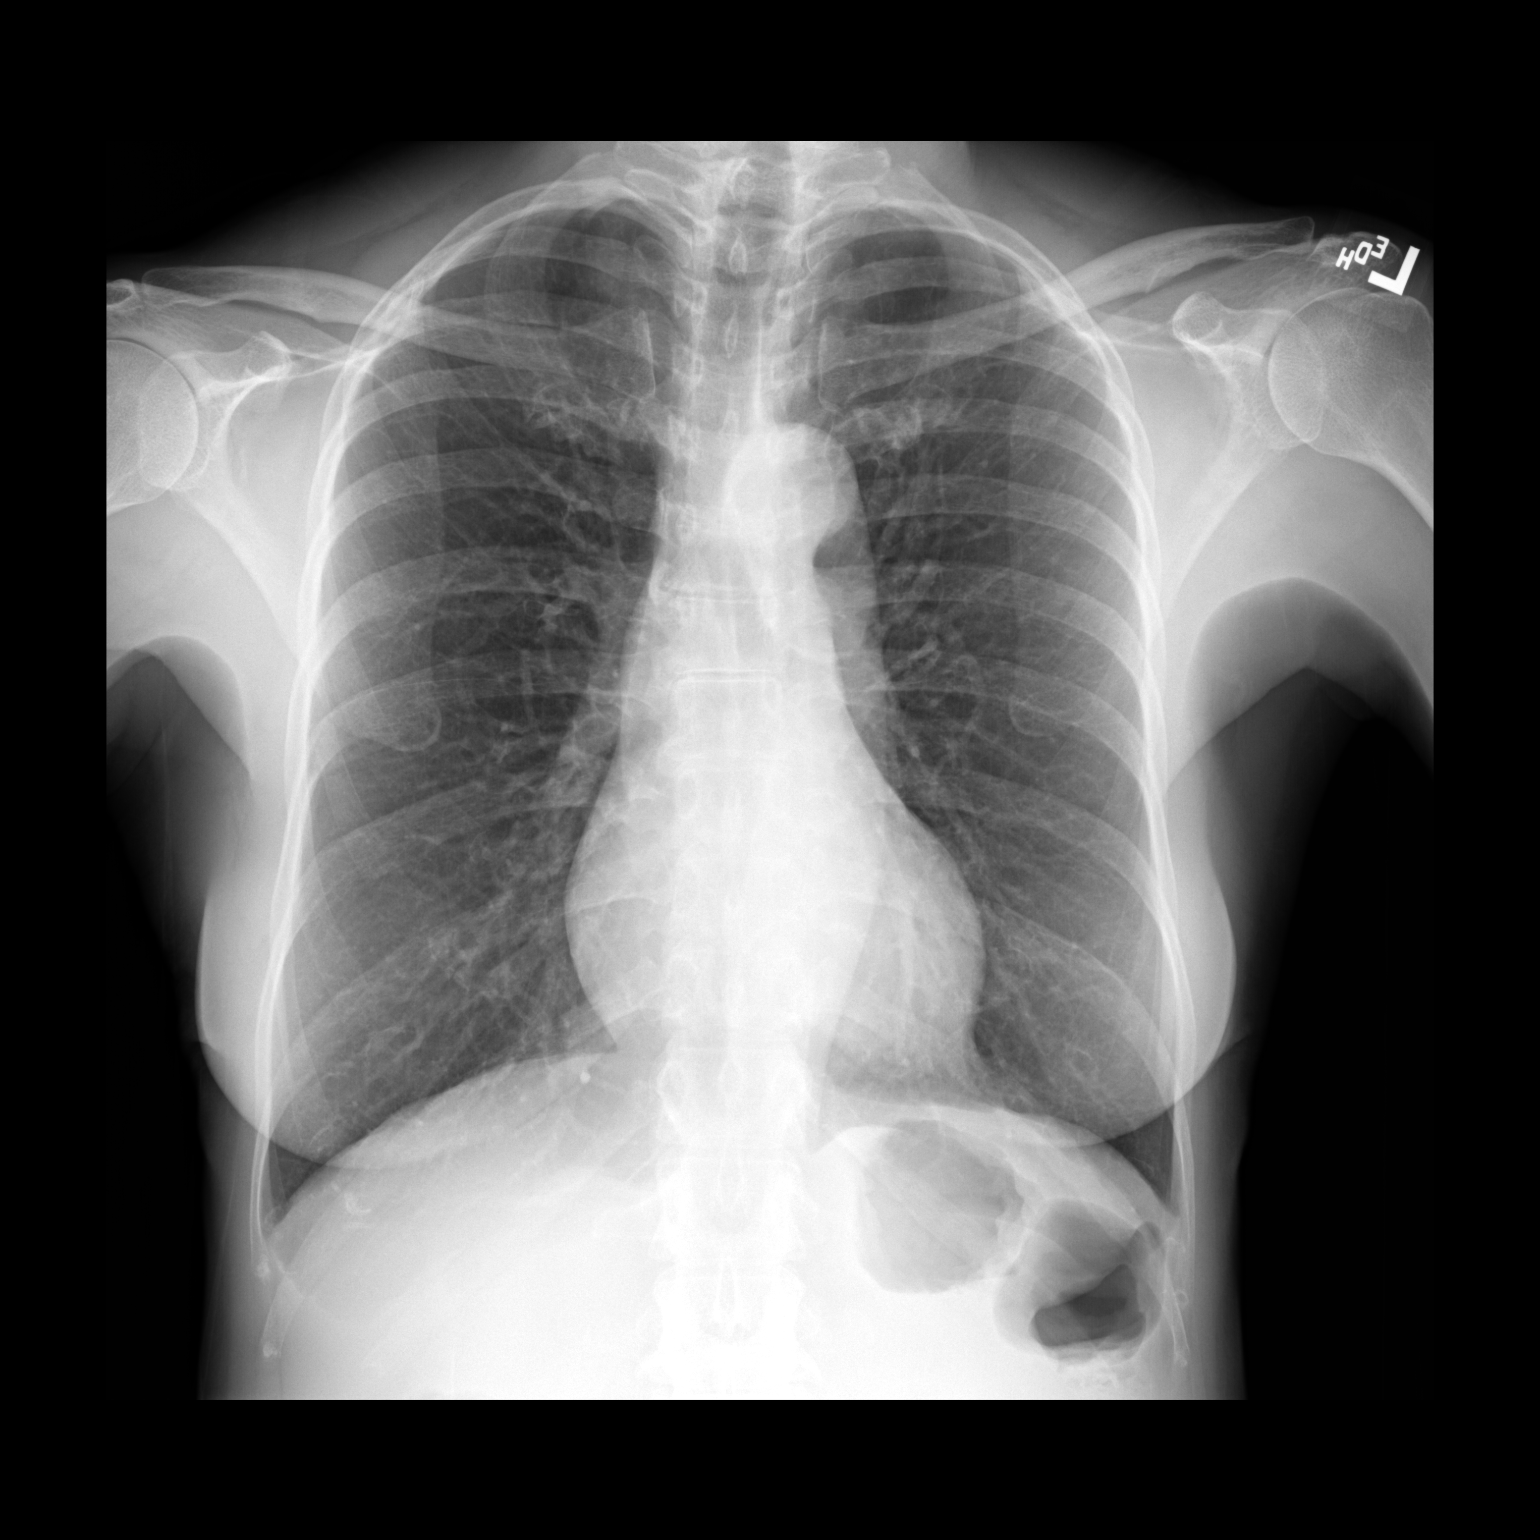

[dg chest 2 view (2 of 2)]
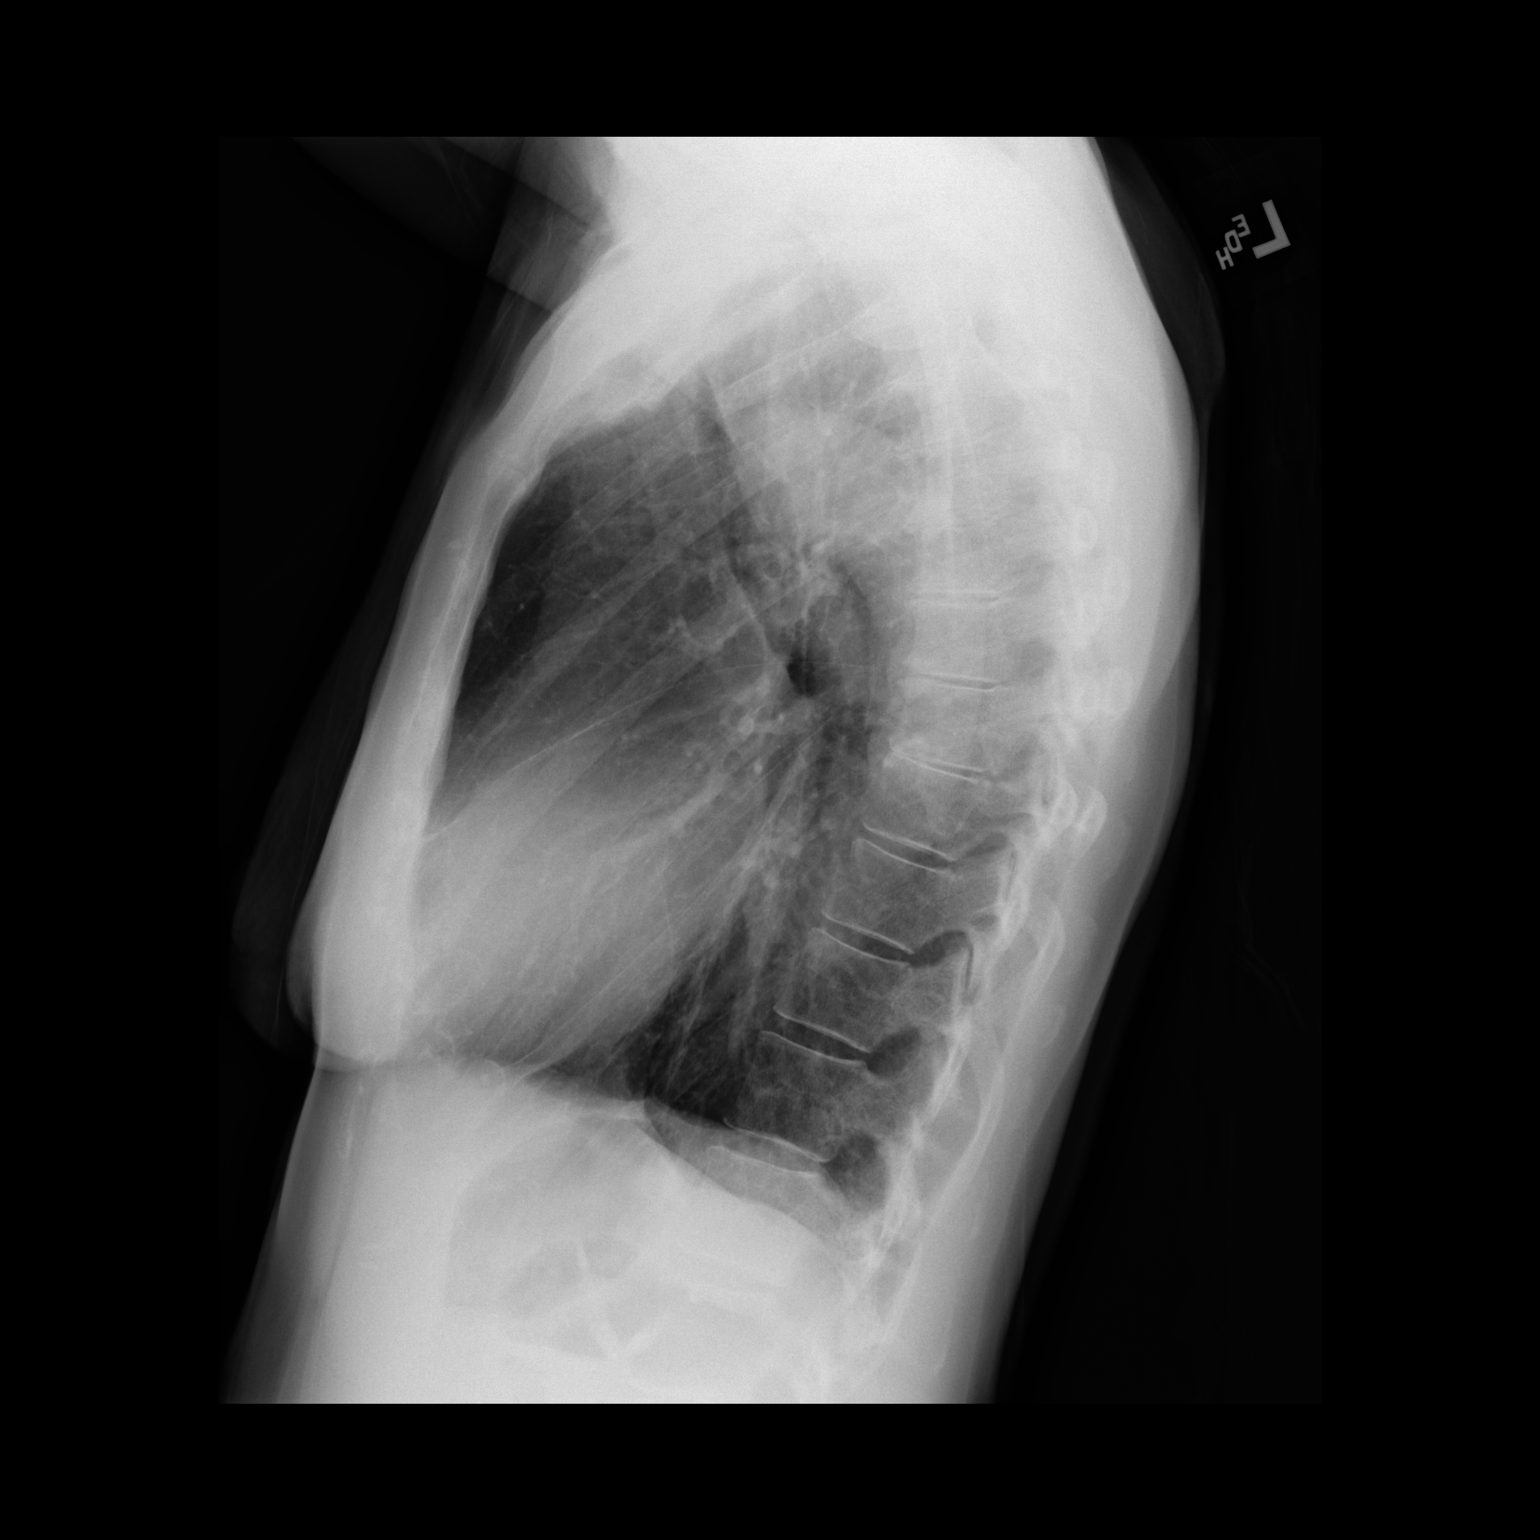

[2 of 2 positions shown; findings below may reference images not displayed]

FINDINGS: The heart size and mediastinal contours are within normal limits.
Stable mild ectasia of the thoracic aorta. Both lungs are clear.
Previously noted soft tissue density near the gastroesophageal
junction is no longer visualized on today's study. Stable mild
thoracic spine degenerative changes.
IMPRESSION: No active cardiopulmonary disease.

## 2018-01-31 ENCOUNTER — Ambulatory Visit: Payer: PRIVATE HEALTH INSURANCE | Admitting: Podiatry

## 2018-01-31 ENCOUNTER — Ambulatory Visit (INDEPENDENT_AMBULATORY_CARE_PROVIDER_SITE_OTHER): Payer: PRIVATE HEALTH INSURANCE

## 2018-01-31 ENCOUNTER — Encounter: Payer: Self-pay | Admitting: Podiatry

## 2018-01-31 VITALS — BP 142/80 | HR 109

## 2018-01-31 DIAGNOSIS — M7750 Other enthesopathy of unspecified foot: Secondary | ICD-10-CM

## 2018-01-31 DIAGNOSIS — M7752 Other enthesopathy of left foot: Secondary | ICD-10-CM

## 2018-01-31 DIAGNOSIS — M659 Synovitis and tenosynovitis, unspecified: Secondary | ICD-10-CM

## 2018-01-31 MED ORDER — TRIAMCINOLONE ACETONIDE 10 MG/ML IJ SUSP
10.0000 mg | Freq: Once | INTRAMUSCULAR | Status: AC
  Administered 2018-01-31: 10 mg

## 2018-01-31 MED ORDER — DICLOFENAC SODIUM 75 MG PO TBEC: 75.0000 mg | DELAYED_RELEASE_TABLET | Freq: Two times a day (BID) | ORAL | 2 refills | Status: DC

## 2018-01-31 NOTE — Progress Notes (Signed)
Subjective:   Patient ID: Sierra Combs, female   DOB: 67 y.o.   MRN: [id number]   HPI Patient presents stating that she is having a lot of pain on the inside of the left ankle and its been going on for about 6 months and she does not remember injury but she was walking in flat shoes.  Patient states walking makes it worse and standing also really bothers her and patient does not smoke and likes to be active   Review of Systems  All other systems reviewed and are negative.       Objective:  Physical Exam  Constitutional: She appears well-developed and well-nourished.  Cardiovascular: Intact distal pulses.  Pulmonary/Chest: Effort normal.  Musculoskeletal: Normal range of motion.  Neurological: She is alert.  Skin: Skin is warm.  Nursing note and vitals reviewed.   Neurovascular status intact muscle strength adequate range of motion within normal limits with patient found to have inflammation and swelling on the medial side of the left ankle with a lot of tenderness associated with it with moderate depression of the arch, the posterior tibial tendon is checked it does appear to be functioning but it is sore and it is hard for her to move her foot because of the pain     Assessment:  Acute posterior tibial tendinitis left     Plan:  H&P condition and x-ray reviewed with patient and careful sheath injection administered left posterior tibial tendon at the insertion to the navicular and I dispensed fascial brace to lift up the arch gave instructions for ice and placed on oral diclofenac 75 mg twice daily and gave instructions for supportive shoes.  Discussed possible immobilization and MRI which may be necessary in future  X-ray indicates that there is moderate depression of the arch but no indications of posterior tibial complete dysfunction

## 2018-02-03 LAB — HM MAMMOGRAPHY

## 2018-02-14 ENCOUNTER — Encounter: Payer: Self-pay | Admitting: Podiatry

## 2018-02-14 ENCOUNTER — Ambulatory Visit: Payer: PRIVATE HEALTH INSURANCE | Admitting: Podiatry

## 2018-02-14 DIAGNOSIS — M216X1 Other acquired deformities of right foot: Secondary | ICD-10-CM

## 2018-02-14 DIAGNOSIS — M659 Synovitis and tenosynovitis, unspecified: Secondary | ICD-10-CM | POA: Diagnosis not present

## 2018-02-14 NOTE — Progress Notes (Signed)
Subjective:   Patient ID: Sierra Combs, female   DOB: 67 y.o.   MRN: [id number]   HPI Patient states she is doing much better with significant reduction of the discomfort that she had previously   ROS      Objective:  Physical Exam  Neurovascular status intact with patient found to have continued depression of the arch bilateral with inflammation that has improved in the left posterior tibial tendon     Assessment:  Doing better after having posterior tibial tendinitis left with moderate depression of the arch     Plan:  H&P condition reviewed and recommended orthotics to lift the arch up and reviewed orthotics and I recommended they be made of a slightly more rigid material to hold the arch and reduce stress on the posterior tibial tendon along with deep heel seat.  Patient is scheduled with ped orthotist to have these constructed

## 2018-02-20 ENCOUNTER — Ambulatory Visit (INDEPENDENT_AMBULATORY_CARE_PROVIDER_SITE_OTHER): Payer: PRIVATE HEALTH INSURANCE | Admitting: Orthotics

## 2018-02-20 DIAGNOSIS — M7751 Other enthesopathy of right foot: Secondary | ICD-10-CM | POA: Diagnosis not present

## 2018-02-20 DIAGNOSIS — M216X1 Other acquired deformities of right foot: Secondary | ICD-10-CM

## 2018-02-20 DIAGNOSIS — M7750 Other enthesopathy of unspecified foot: Secondary | ICD-10-CM

## 2018-02-20 DIAGNOSIS — M7752 Other enthesopathy of left foot: Secondary | ICD-10-CM

## 2018-02-20 NOTE — Progress Notes (Signed)
Patient came in for casting for CMFO to address PTTD Left.  When told cost of f/o, she said she would like to come back in two weeks to put 200.00 down; I went ahead and cast her and submitted the order.  She will p/u foot orthotics in three weeks and put the 200.00 down when she picks up.

## 2018-02-21 ENCOUNTER — Other Ambulatory Visit: Payer: Self-pay | Admitting: Family Medicine

## 2018-02-21 DIAGNOSIS — I10 Essential (primary) hypertension: Secondary | ICD-10-CM

## 2018-02-27 NOTE — Progress Notes (Signed)
Chief Complaint  Patient presents with  . Annual Exam    fasting annual exam, just had eye exam in Oct with Dr. Kathlen Mody at [facility]. No concerns.     Sierra Combs is a 67 y.o. female who presents for a complete physical.  She has the following concerns:  Hypertension. She continues on 20mg  of lisinopril, tolerating this without side effects. She admits she hasn't checked BP in the last few weeks.  Recalls last check was 140/85, running higher than in the past. Thinks maybe she is getting more salt in her diet.  +stressors, but she doesn't think it is related (work stress). Eating canned foods, but rinses first. Some sausage She reports we have previously verified her monitor as accurate. She denies dizziness, chest pain, cough, edema.  Hypothyroidism:She is compliant with taking 5mcg levothyroxine daily. She takes it on an empty stomach, separate from food and other medications.She denies any symptoms--no changes in energy, hair/skin/nails/bowels.  Lab Results  Component Value Date   TSH 1.63 03/01/2017    Diabetes follow-up: Blood sugars at home are running 90-115, checking 3x/week. No longer checks it in the evenings. Denies hypoglycemia,polydipsia and polyuria. Eye exams aredoneyearly, last 01/2018. Patient follows a low sugar diet and checks feet regularly without concerns.Denies numbness, tingling or burningpain in the feet(just the ankle pain, seeing foot and ankle doctors).  Lab Results  Component Value Date   HGBA1C 5.9 (A) 09/27/2017   H/o microalbuminuria, noted when she had UTI; recheck in May 2018, and 09/2017 were normal.   Hyperlipidemia follow-up: Patient is reportedly following a low-fat, low cholesterol diet. Compliant with medications(atorvastatin, niaspan and fish oil)and denies medication side effects. No flushing or myalgias.Lipids were at goal on last check on this regimen. Lab Results  Component Value Date   CHOL 163 03/01/2017   HDL 85  03/01/2017   LDLCALC 66 03/01/2017   TRIG 41 03/01/2017   CHOLHDL 1.9 03/01/2017   H/o anemia--noted to have iron deficiency anemiain 02/2016, 08/2016.She had episode of hemorrhagic cystitis prior to first noting the anemia. She denies any bleeding from anywhere, and no early satiety, heartburn, reflux, chest pain, changes in bowels, hematochezia, melena. Last check was normal, IDA had resolved.  Lab Results  Component Value Date   WBC 6.1 09/27/2017   HGB 12.3 09/27/2017   HCT 37.0 09/27/2017   MCV 88 09/27/2017   PLT 317 09/27/2017   Lab Results  Component Value Date   FERRITIN 20 09/27/2017   Osteopenia: She had DEXA in 05/2012, which showed T-1.3. She had been on Fosamax for many years (>5), so it was stopped after that DEXA. She had f/u DEX[facility] at [facility] 12/2014, and T -1.5, 2.6% decline.She had DEXA last checked 01/2017, overall stable.  T-1.7 at R fem neck, -1.6 spine (-7% change in spine, stable in other areas). Vitamin D level was normal at 55 in 05/2012.   Left ankle pain--seeing Dr. Paulla Dolly, had injections and is in the process of getting orthotics. Pain is much better.  Immunization History  Administered Date(s) Administered  . Influenza Split 02/10/2012  . Influenza, High Dose Seasonal PF 02/15/2017  . Influenza-Unspecified 01/13/2015, 02/01/2016  . Pneumococcal Conjugate-13 02/25/2016  . Pneumococcal Polysaccharide-23 05/04/2007, 03/01/2017  . Td 06/17/1997  . Tdap 05/04/2007, 12/17/2012  . Zoster 12/30/2013   Got her flu shot at work Last Pap smear:01/2015; no high risk HPV detected Last mammogram:01/2018 Last colonoscopy: 11/2013 with Dr. Penelope Coop (due again 11/2018)  Last DEXA: 01/2017, T-1.7 R  fem neck, -1.6 spine Dentist: after many years, she finally went, seen regularly this past year. Due again in 05/2018. Ophtho: yearly (last month),Dr. Kathlen Mody Exercise: limited when her ankle was bothering her, better now. Not getting regular aerobic exercise currently. No  weight-bearing exercise.   Other doctors caring for patient: Ophtho: Dr. Kathlen Mody GI: Dr. Penelope Coop Ortho: can't recall who she saw, plans to see another in future, when needed Dentist: Dr. Debria Garret Podiatrist: Dr. Paulla Dolly  She does not having a Living Will or Healthcare power of attorney. Given paperwork 2 years ago. "My daughter is going to handle all that". Still hasn't done this. Isn't sure if she still has the papers.  Fall screen negative Depression screen negative Functional status survey unremarkable.  Past Medical History:  Diagnosis Date  . Breast cyst 3cm L breast  . Colonic polyp   . Diabetes mellitus   . Diverticulosis   . Fatty liver   . Hyperlipidemia small particles 6/06  . Hypertension   . Menopause 2002  . Osteopenia L hip  . Unspecified hypothyroidism 09/09/2010    Past Surgical History:  Procedure Laterality Date  . BREAST BIOPSY  7/06 L breast benign  . CESAREAN SECTION    . COLONOSCOPY  6/05, 09/2008, 11/2013   Dr. Penelope Coop    Social History   Socioeconomic History  . Marital status: Married    Spouse name: Not on file  . Number of children: 1  . Years of education: Not on file  . Highest education level: Not on file  Occupational History  . Occupation: Microbiologist: [employer]  . Financial resource strain: Not on file  . Food insecurity:    Worry: Not on file    Inability: Not on file  . Transportation needs:    Medical: Not on file    Non-medical: Not on file  Tobacco Use  . Smoking status: Never Smoker  . Smokeless tobacco: Never Used  Substance and Sexual Activity  . Alcohol use: No  . Drug use: No  . Sexual activity: Yes    Partners: Male  Lifestyle  . Physical activity:    Days per week: Not on file    Minutes per session: Not on file  . Stress: Not on file  Relationships  . Social connections:    Talks on phone: Not on file    Gets together: Not on file    Attends  religious service: Not on file    Active member of club or organization: Not on file    Attends meetings of clubs or organizations: Not on file    Relationship status: Not on file  . Intimate partner violence:    Fear of current or ex partner: Not on file    Emotionally abused: Not on file    Physically abused: Not on file    Forced sexual activity: Not on file  Other Topics Concern  . Not on file  Social History Narrative   Lives with husband, no pets.  Daughter lives in [place].  +passive tobacco exposure from her husband (and smokes inside)    Family History  Problem Relation Age of Onset  . Diabetes Mother   . Heart disease Mother   . Hyperlipidemia Mother   . Hypertension Mother   . Diabetes Father   . Heart disease Father   . Hyperlipidemia Father   . Hypertension Father   . Diabetes Sister   . Diabetes  Brother        legs amputated  . Kidney disease Brother        on dialysis  . Diabetes Sister   . Lung cancer Sister        not smoker; +exposure from husband; works in Proofreader  . Cancer Sister        lung, stage 4    Outpatient Encounter Medications as of 03/01/2018  Medication Sig Note  . aspirin 81 MG tablet Take 81 mg by mouth daily.     Marland Kitchen atorvastatin (LIPITOR) 80 MG tablet Take 1 tablet (80 mg total) by mouth daily.   . Calcium Carbonate-Vitamin D (CALCIUM 600 + D PO) Take 1 tablet by mouth 2 (two) times daily.     . diclofenac (VOLTAREN) 75 MG EC tablet Take 1 tablet (75 mg total) by mouth 2 (two) times daily. 03/01/2018: Taking per podiatrist  . fish oil-omega-3 fatty acids 1000 MG capsule Take 1 g by mouth daily.    Marland Kitchen levothyroxine (SYNTHROID, LEVOTHROID) 88 MCG tablet Take 1 tablet (88 mcg total) by mouth daily.   Marland Kitchen lisinopril (PRINIVIL,ZESTRIL) 20 MG tablet TAKE 1 TABLET BY MOUTH EVERY DAY   . metFORMIN (GLUCOPHAGE) 1000 MG tablet Take 1 tablet (1,000 mg total) by mouth 2 (two) times daily with a meal.   . niacin (NIASPAN) 1000 MG CR tablet Take 1  tablet (1,000 mg total) by mouth at bedtime.    No facility-administered encounter medications on file as of 03/01/2018.    No Known Allergies  ROS: The patient denies anorexia, fever, headaches, vision changes, decreased hearing, ear pain, sore throat, breast concerns, chest pain, palpitations, dizziness, syncope, dyspnea on exertion, cough, swelling, nausea, vomiting, diarrhea, constipation, abdominal pain, melena, hematochezia, indigestion/heartburn, hematuria, incontinence, dysuria, vaginal bleeding, discharge, odor or itch, genital lesions, numbness, tingling, weakness, tremor, suspicious skin lesions, depression, anxiety, abnormal bleeding/bruising or enlarged lymph nodes.  Some vaginal dryness (very mild). Denies hot flashes or night sweats. Right knee arthritis, intermittent pain, not bothering her much recently. Left ankle pain, improved with treatment.    PHYSICAL EXAM:  BP (!) 150/90   Pulse 100   Ht 5\' 3"  (1.6 m)   Wt 111 lb 9.6 oz (50.6 kg)   BMI 19.77 kg/m   134/80 on repeat by MD  Wt Readings from Last 3 Encounters:  03/01/18 111 lb 9.6 oz (50.6 kg)  09/27/17 108 lb 6.4 oz (49.2 kg)  05/25/17 109 lb 3.2 oz (49.5 kg)    General Appearance:  Alert, cooperative, no distress, appears stated age   Head:  Normocephalic, without obvious abnormality, atraumatic   Eyes:  PERRL, conjunctiva/corneas clear, EOM's intact, fundi benign   Ears:  Normal TM's and external ear canals  Nose:  Nares normal, mucosa mildly edematous, no erythema, no drainage or sinus tenderness   Throat:  Lips, mucosa, and tongue normal; teeth in poor repair with some decay. Gums appear healthier (has had significant cleaning since prior visit)   Neck:  Supple, no lymphadenopathy; thyroid: no enlargement/tenderness/ nodules; no carotid bruit or JVD   Back:  Spine nontender, no curvature, ROM normal, no CVA tenderness   Lungs:  Clear to auscultation bilaterally without wheezes, rales or  ronchi; respirations unlabored   Chest Wall:  No tenderness or deformity   Heart:  Regular rate and rhythm, S1 and S2 normal, no murmur, rub or gallop   Breast Exam:  No tenderness, masses, or nipple discharge or inversion. No axillary lymphadenopathy. Nontender, no  masses..   Abdomen:  Soft, non-tender, nondistended, normoactive bowel sounds, no masses, no hepatosplenomegaly; small, easily reducible umbilical hernia  Genitalia:  Normal external genitalia without lesions. Mild atrophic changes and vaginal dryness. BUS and vagina normal; no cervical motion tenderness. No abnormal vaginal discharge. Uterus and adnexa not enlarged, nontender, no masses. Papnotperformed   Rectal:  Normal tone, no masses or tenderness;heme negative stool  Extremities:  No clubbing, cyanosis or edema.   Pulses:  2+ and symmetric all extremities   Skin:  Skin color, texture, turgor normal, no rashes or lesions.  Lymph nodes:  Cervical, supraclavicular, and axillary nodes normal   Neurologic:  CNII-XII intact, normal strength, sensation and gait; reflexes 2+ and symmetric throughout   Psych: Normal mood, affect, hygiene and grooming  Normal diabetic foot exam.   ASSESSMENT/PLAN:  Annual physical exam - Plan: POCT Urinalysis DIP (Proadvantage Device), Comprehensive metabolic panel, Lipid panel, TSH, CBC with Differential/Platelet  Essential hypertension, benign - elevated today; discussed low Na diet, regular exercise.  To monitor at home and f/u sooner if remains elevated - Plan: Comprehensive metabolic panel  Hypothyroidism, unspecified type - Plan: TSH  Type 2 diabetes mellitus without complication, without long-term current use of insulin (HCC) - Plan: HgB A1c, Comprehensive metabolic panel, TSH, metFORMIN (GLUCOPHAGE) 1000 MG tablet  Pure hypercholesterolemia - Plan: Lipid panel, niacin (NIASPAN) 1000 MG CR tablet, atorvastatin (LIPITOR) 80 MG tablet  Iron  deficiency anemia, unspecified iron deficiency anemia type - history; had resolved. Recheck. - Plan: CBC with Differential/Platelet  Osteopenia, unspecified location - DEXA due next year (01/2019). Discussed Ca, D and weight-bearing exercise  Medication monitoring encounter - Plan: Comprehensive metabolic panel, Lipid panel, TSH, CBC with Differential/Platelet  Type 2 diabetes mellitus without complication, without long-term current use of insulin (HCC) - well controlled - Plan: HgB A1c, Comprehensive metabolic panel, TSH, metFORMIN (GLUCOPHAGE) 1000 MG tablet  Pure hypercholesterolemia - previously at goal, due for recheck - Plan: Lipid panel, niacin (NIASPAN) 1000 MG CR tablet, atorvastatin (LIPITOR) 80 MG tablet  Advanced directives--Given additional paperwork and discussed in detail. Encouraged her to complete and return Living Will and healthcare POA>  Discussed monthly self breast exams and yearly mammograms; at least 30 minutes of aerobic activity at least 5 days/week and weight bearing exercise at least 2x/wk; proper sunscreen use reviewed; healthy diet, including goals of calcium and vitamin D intake and alcohol recommendations (less than or equal to 1 drink/day) reviewed; regular seatbelt use; changing batteries in smoke detectors. Immunization recommendations discussed--continue yearly flu shots. Shingrix recommended, to check with insurance and set up NV (will need to get at pharmacy once on Medicare).Colonoscopy recommendations reviewed--UTD, due again in 2020.  Mail in BP's in the next month or two, monitor more regularly at home. Reviewed low sodium diet.  F/u 6 months, sooner prn if BP's remain elevated

## 2018-03-01 ENCOUNTER — Encounter: Payer: Self-pay | Admitting: Family Medicine

## 2018-03-01 ENCOUNTER — Ambulatory Visit: Payer: PRIVATE HEALTH INSURANCE | Admitting: Family Medicine

## 2018-03-01 VITALS — BP 134/80 | HR 100 | Ht 63.0 in | Wt 111.6 lb

## 2018-03-01 DIAGNOSIS — I10 Essential (primary) hypertension: Secondary | ICD-10-CM | POA: Diagnosis not present

## 2018-03-01 DIAGNOSIS — Z0000 Encounter for general adult medical examination without abnormal findings: Secondary | ICD-10-CM | POA: Diagnosis not present

## 2018-03-01 DIAGNOSIS — E039 Hypothyroidism, unspecified: Secondary | ICD-10-CM

## 2018-03-01 DIAGNOSIS — Z5181 Encounter for therapeutic drug level monitoring: Secondary | ICD-10-CM

## 2018-03-01 DIAGNOSIS — E7800 Pure hypercholesterolemia, unspecified: Secondary | ICD-10-CM

## 2018-03-01 DIAGNOSIS — E119 Type 2 diabetes mellitus without complications: Secondary | ICD-10-CM | POA: Diagnosis not present

## 2018-03-01 DIAGNOSIS — D509 Iron deficiency anemia, unspecified: Secondary | ICD-10-CM

## 2018-03-01 DIAGNOSIS — M8580 Other specified disorders of bone density and structure, unspecified site: Secondary | ICD-10-CM

## 2018-03-01 LAB — POCT URINALYSIS DIP (PROADVANTAGE DEVICE)
Bilirubin, UA: NEGATIVE
Blood, UA: NEGATIVE
Glucose, UA: NEGATIVE mg/dL
Ketones, POC UA: NEGATIVE mg/dL
LEUKOCYTES UA: NEGATIVE
Nitrite, UA: NEGATIVE
PROTEIN UA: NEGATIVE mg/dL
SPECIFIC GRAVITY, URINE: 1.005
UUROB: NEGATIVE
pH, UA: 7 (ref 5.0–8.0)

## 2018-03-01 LAB — POCT GLYCOSYLATED HEMOGLOBIN (HGB A1C): HEMOGLOBIN A1C: 5.9 % — AB (ref 4.0–5.6)

## 2018-03-01 MED ORDER — METFORMIN HCL 1000 MG PO TABS: 1000.0000 mg | ORAL_TABLET | Freq: Two times a day (BID) | ORAL | 5 refills | Status: DC

## 2018-03-01 NOTE — Patient Instructions (Addendum)
HEALTH MAINTENANCE RECOMMENDATIONS:  It is recommended that you get at least 30 minutes of aerobic exercise at least 5 days/week (for weight loss, you may need as much as 60-90 minutes). This can be any activity that gets your heart rate up. This can be divided in 10-15 minute intervals if needed, but try and build up your endurance at least once a week.  Weight bearing exercise is also recommended twice weekly.  Eat a healthy diet with lots of vegetables, fruits and fiber.  "Colorful" foods have a lot of vitamins (ie green vegetables, tomatoes, red peppers, etc).  Limit sweet tea, regular sodas and alcoholic beverages, all of which has a lot of calories and sugar.  Up to 1 alcoholic drink daily may be beneficial for women (unless trying to lose weight, watch sugars).  Drink a lot of water.  Calcium recommendations are 1200-1500 mg daily (1500 mg for postmenopausal women or women without ovaries), and vitamin D 1000 IU daily.  This should be obtained from diet and/or supplements (vitamins), and calcium should not be taken all at once, but in divided doses.  Monthly self breast exams and yearly mammograms for women over the age of 47 is recommended.  Sunscreen of at least SPF 30 should be used on all sun-exposed parts of the skin when outside between the hours of 10 am and 4 pm (not just when at beach or pool, but even with exercise, golf, tennis, and yard work!)  Use a sunscreen that says "broad spectrum" so it covers both UVA and UVB rays, and make sure to reapply every 1-2 hours.  Remember to change the batteries in your smoke detectors when changing your clock times in the spring and fall.  Use your seat belt every time you are in a car, and please drive safely and not be distracted with cell phones and texting while driving.  I recommend getting the new shingles vaccine (Shingrix). You will need to check with your insurance to see if it is covered, and if covered by Medicare Part D, you need to  get from the pharmacy rather than our office.  It is a series of 2 injections, spaced 2 months apart. Call to schedule a nurse visit for the vaccine once you verify coverage (as long as you are still on your same insurance).  Your next colonoscopy is due August 2020.  Please discuss the Advanced directives with your family, get notarized and return a copy to [place] (Living Will and healthcare power of attorney).  We discussed getting 150 minutes/week of aerobic exercise and weight-bearing exercise at least 2-3 days/week.  Your blood pressure was high initially, improved when rechecked. Goal blood pressure is under 130-135/80-85 (preferably under 130/80).  Continue low sodium diet.  Regular exercise helps lower the blood pressure. Please monitor your blood pressure more regularly over the next month, and feel free to mail in your log for our review. If persistently over 135-140/85-90, schedule an office visit.   Low-Sodium Eating Plan Sodium, which is an element that makes up salt, helps you maintain a healthy balance of fluids in your body. Too much sodium can increase your blood pressure and cause fluid and waste to be held in your body. Your health care provider or dietitian may recommend following this plan if you have high blood pressure (hypertension), kidney disease, liver disease, or heart failure. Eating less sodium can help lower your blood pressure, reduce swelling, and protect your heart, liver, and kidneys. What are tips for  following this plan? General guidelines  Most people on this plan should limit their sodium intake to 1,500-2,000 mg (milligrams) of sodium each day. Reading food labels  The Nutrition Facts label lists the amount of sodium in one serving of the food. If you eat more than one serving, you must multiply the listed amount of sodium by the number of servings.  Choose foods with less than 140 mg of sodium per serving.  Avoid foods with 300 mg of sodium or more per  serving. Shopping  Look for lower-sodium products, often labeled as "low-sodium" or "no salt added."  Always check the sodium content even if foods are labeled as "unsalted" or "no salt added".  Buy fresh foods. ? Avoid canned foods and premade or frozen meals. ? Avoid canned, cured, or processed meats  Buy breads that have less than 80 mg of sodium per slice. Cooking  Eat more home-cooked food and less restaurant, buffet, and fast food.  Avoid adding salt when cooking. Use salt-free seasonings or herbs instead of table salt or sea salt. Check with your health care provider or pharmacist before using salt substitutes.  Cook with plant-based oils, such as canola, sunflower, or olive oil. Meal planning  When eating at a restaurant, ask that your food be prepared with less salt or no salt, if possible.  Avoid foods that contain MSG (monosodium glutamate). MSG is sometimes added to [place] food, bouillon, and some canned foods. What foods are recommended? The items listed may not be a complete list. Talk with your dietitian about what dietary choices are best for you. Grains Low-sodium cereals, including oats, puffed wheat and rice, and shredded wheat. Low-sodium crackers. Unsalted rice. Unsalted pasta. Low-sodium bread. Whole-grain breads and whole-grain pasta. Vegetables Fresh or frozen vegetables. "No salt added" canned vegetables. "No salt added" tomato sauce and paste. Low-sodium or reduced-sodium tomato and vegetable juice. Fruits Fresh, frozen, or canned fruit. Fruit juice. Meats and other protein foods Fresh or frozen (no salt added) meat, poultry, seafood, and fish. Low-sodium canned tuna and salmon. Unsalted nuts. Dried peas, beans, and lentils without added salt. Unsalted canned beans. Eggs. Unsalted nut butters. Dairy Milk. Soy milk. Cheese that is naturally low in sodium, such as ricotta cheese, fresh mozzarella, or Swiss cheese Low-sodium or reduced-sodium cheese. Cream  cheese. Yogurt. Fats and oils Unsalted butter. Unsalted margarine with no trans fat. Vegetable oils such as canola or olive oils. Seasonings and other foods Fresh and dried herbs and spices. Salt-free seasonings. Low-sodium mustard and ketchup. Sodium-free salad dressing. Sodium-free light mayonnaise. Fresh or refrigerated horseradish. Lemon juice. Vinegar. Homemade, reduced-sodium, or low-sodium soups. Unsalted popcorn and pretzels. Low-salt or salt-free chips. What foods are not recommended? The items listed may not be a complete list. Talk with your dietitian about what dietary choices are best for you. Grains Instant hot cereals. Bread stuffing, pancake, and biscuit mixes. Croutons. Seasoned rice or pasta mixes. Noodle soup cups. Boxed or frozen macaroni and cheese. Regular salted crackers. Self-rising flour. Vegetables Sauerkraut, pickled vegetables, and relishes. Olives. [place] fries. Onion rings. Regular canned vegetables (not low-sodium or reduced-sodium). Regular canned tomato sauce and paste (not low-sodium or reduced-sodium). Regular tomato and vegetable juice (not low-sodium or reduced-sodium). Frozen vegetables in sauces. Meats and other protein foods Meat or fish that is salted, canned, smoked, spiced, or pickled. Bacon, ham, sausage, hotdogs, corned beef, chipped beef, packaged lunch meats, salt pork, jerky, pickled herring, anchovies, regular canned tuna, sardines, salted nuts. Dairy Processed cheese and cheese spreads.  Cheese curds. Blue cheese. Feta cheese. String cheese. Regular cottage cheese. Buttermilk. Canned milk. Fats and oils Salted butter. Regular margarine. Ghee. Bacon fat. Seasonings and other foods Onion salt, garlic salt, seasoned salt, table salt, and sea salt. Canned and packaged gravies. Worcestershire sauce. Tartar sauce. Barbecue sauce. Teriyaki sauce. Soy sauce, including reduced-sodium. Steak sauce. Fish sauce. Oyster sauce. Cocktail sauce. Horseradish that you  find on the shelf. Regular ketchup and mustard. Meat flavorings and tenderizers. Bouillon cubes. Hot sauce and Tabasco sauce. Premade or packaged marinades. Premade or packaged taco seasonings. Relishes. Regular salad dressings. Salsa. Potato and tortilla chips. Corn chips and puffs. Salted popcorn and pretzels. Canned or dried soups. Pizza. Frozen entrees and pot pies. Summary  Eating less sodium can help lower your blood pressure, reduce swelling, and protect your heart, liver, and kidneys.  Most people on this plan should limit their sodium intake to 1,500-2,000 mg (milligrams) of sodium each day.  Canned, boxed, and frozen foods are high in sodium. Restaurant foods, fast foods, and pizza are also very high in sodium. You also get sodium by adding salt to food.  Try to cook at home, eat more fresh fruits and vegetables, and eat less fast food, canned, processed, or prepared foods. This information is not intended to replace advice given to you by your health care provider. Make sure you discuss any questions you have with your health care provider. Document Released: 09/17/2001 Document Revised: 03/21/2016 Document Reviewed: 03/21/2016 Elsevier Interactive Patient Education  [employer].

## 2018-03-02 ENCOUNTER — Encounter: Payer: Self-pay | Admitting: Family Medicine

## 2018-03-02 LAB — COMPREHENSIVE METABOLIC PANEL
A/G RATIO: 1.7 (ref 1.2–2.2)
ALT: 21 IU/L (ref 0–32)
AST: 29 IU/L (ref 0–40)
Albumin: 4.5 g/dL (ref 3.6–4.8)
Alkaline Phosphatase: 65 IU/L (ref 39–117)
BILIRUBIN TOTAL: 0.3 mg/dL (ref 0.0–1.2)
BUN / CREAT RATIO: 14 (ref 12–28)
BUN: 14 mg/dL (ref 8–27)
CHLORIDE: 99 mmol/L (ref 96–106)
CO2: 22 mmol/L (ref 20–29)
Calcium: 9.8 mg/dL (ref 8.7–10.3)
Creatinine, Ser: 0.97 mg/dL (ref 0.57–1.00)
GFR calc Af Amer: 70 mL/min/{1.73_m2} (ref >59)
GFR calc non Af Amer: 61 mL/min/{1.73_m2} (ref >59)
GLOBULIN, TOTAL: 2.6 g/dL (ref 1.5–4.5)
Glucose: 94 mg/dL (ref 65–99)
POTASSIUM: 4.4 mmol/L (ref 3.5–5.2)
SODIUM: 143 mmol/L (ref 134–144)
Total Protein: 7.1 g/dL (ref 6.0–8.5)

## 2018-03-02 LAB — CBC WITH DIFFERENTIAL/PLATELET
Basophils Absolute: 0 10*3/uL (ref 0.0–0.2)
Basos: 0 %
EOS (ABSOLUTE): 0.3 10*3/uL (ref 0.0–0.4)
Eos: 4 %
Hematocrit: 36.6 % (ref 34.0–46.6)
Hemoglobin: 12.1 g/dL (ref 11.1–15.9)
Immature Grans (Abs): 0 10*3/uL (ref 0.0–0.1)
Immature Granulocytes: 0 %
Lymphocytes Absolute: 2 10*3/uL (ref 0.7–3.1)
Lymphs: 29 %
MCH: 29.4 pg (ref 26.6–33.0)
MCHC: 33.1 g/dL (ref 31.5–35.7)
MCV: 89 fL (ref 79–97)
Monocytes Absolute: 0.3 10*3/uL (ref 0.1–0.9)
Monocytes: 5 %
Neutrophils Absolute: 4.3 10*3/uL (ref 1.4–7.0)
Neutrophils: 62 %
Platelets: 329 10*3/uL (ref 150–450)
RBC: 4.11 x10E6/uL (ref 3.77–5.28)
RDW: 12.5 % (ref 12.3–15.4)
WBC: 7 10*3/uL (ref 3.4–10.8)

## 2018-03-02 LAB — LIPID PANEL
Chol/HDL Ratio: 2.5 ratio (ref 0.0–4.4)
Cholesterol, Total: 164 mg/dL (ref 100–199)
HDL: 66 mg/dL (ref >39)
LDL Calculated: 89 mg/dL (ref 0–99)
Triglycerides: 43 mg/dL (ref 0–149)
VLDL Cholesterol Cal: 9 mg/dL (ref 5–40)

## 2018-03-02 LAB — TSH: TSH: 3.89 u[IU]/mL (ref 0.450–4.500)

## 2018-03-02 MED ORDER — NIACIN ER (ANTIHYPERLIPIDEMIC) 1000 MG PO TBCR: 1000.0000 mg | EXTENDED_RELEASE_TABLET | Freq: Every day | ORAL | 5 refills | Status: DC

## 2018-03-02 MED ORDER — ATORVASTATIN CALCIUM 80 MG PO TABS: 80.0000 mg | ORAL_TABLET | Freq: Every day | ORAL | 5 refills | Status: DC

## 2018-03-13 ENCOUNTER — Ambulatory Visit (INDEPENDENT_AMBULATORY_CARE_PROVIDER_SITE_OTHER): Payer: PRIVATE HEALTH INSURANCE | Admitting: Orthotics

## 2018-03-13 DIAGNOSIS — M65979 Unspecified synovitis and tenosynovitis, unspecified ankle and foot: Secondary | ICD-10-CM

## 2018-03-13 DIAGNOSIS — M659 Synovitis and tenosynovitis, unspecified: Secondary | ICD-10-CM

## 2018-03-13 DIAGNOSIS — M216X1 Other acquired deformities of right foot: Secondary | ICD-10-CM

## 2018-03-13 DIAGNOSIS — M7750 Other enthesopathy of unspecified foot: Secondary | ICD-10-CM

## 2018-03-13 NOTE — Progress Notes (Signed)
Patient came in today to pick up custom made foot orthotics.  The goals were accomplished and the patient reported no dissatisfaction with said orthotics.  Patient was advised of breakin period and how to report any issues. 

## 2018-03-17 ENCOUNTER — Other Ambulatory Visit: Payer: Self-pay | Admitting: Family Medicine

## 2018-03-17 DIAGNOSIS — E039 Hypothyroidism, unspecified: Secondary | ICD-10-CM

## 2018-04-11 DIAGNOSIS — Z9289 Personal history of other medical treatment: Secondary | ICD-10-CM

## 2018-04-11 HISTORY — DX: Personal history of other medical treatment: Z92.89

## 2018-05-17 ENCOUNTER — Other Ambulatory Visit: Payer: Self-pay | Admitting: Family Medicine

## 2018-05-17 DIAGNOSIS — I10 Essential (primary) hypertension: Secondary | ICD-10-CM

## 2018-08-11 ENCOUNTER — Other Ambulatory Visit: Payer: Self-pay | Admitting: Family Medicine

## 2018-08-11 DIAGNOSIS — I10 Essential (primary) hypertension: Secondary | ICD-10-CM

## 2018-08-23 ENCOUNTER — Other Ambulatory Visit: Payer: Self-pay | Admitting: Family Medicine

## 2018-08-23 DIAGNOSIS — Z5181 Encounter for therapeutic drug level monitoring: Secondary | ICD-10-CM

## 2018-08-23 DIAGNOSIS — E119 Type 2 diabetes mellitus without complications: Secondary | ICD-10-CM

## 2018-08-23 DIAGNOSIS — E039 Hypothyroidism, unspecified: Secondary | ICD-10-CM

## 2018-08-30 ENCOUNTER — Other Ambulatory Visit: Payer: Self-pay

## 2018-08-30 ENCOUNTER — Other Ambulatory Visit: Payer: Medicare Other

## 2018-08-30 DIAGNOSIS — Z5181 Encounter for therapeutic drug level monitoring: Secondary | ICD-10-CM

## 2018-08-30 DIAGNOSIS — E119 Type 2 diabetes mellitus without complications: Secondary | ICD-10-CM

## 2018-08-30 DIAGNOSIS — E039 Hypothyroidism, unspecified: Secondary | ICD-10-CM

## 2018-08-31 LAB — COMPREHENSIVE METABOLIC PANEL
ALT: 15 IU/L (ref 0–32)
AST: 25 IU/L (ref 0–40)
Albumin/Globulin Ratio: 1.7 (ref 1.2–2.2)
Albumin: 4.3 g/dL (ref 3.8–4.8)
Alkaline Phosphatase: 68 IU/L (ref 39–117)
BUN/Creatinine Ratio: 15 (ref 12–28)
BUN: 14 mg/dL (ref 8–27)
Bilirubin Total: 0.3 mg/dL (ref 0.0–1.2)
CO2: 23 mmol/L (ref 20–29)
Calcium: 9.9 mg/dL (ref 8.7–10.3)
Chloride: 101 mmol/L (ref 96–106)
Creatinine, Ser: 0.94 mg/dL (ref 0.57–1.00)
GFR calc Af Amer: 73 mL/min/{1.73_m2} (ref >59)
GFR calc non Af Amer: 63 mL/min/{1.73_m2} (ref >59)
Globulin, Total: 2.6 g/dL (ref 1.5–4.5)
Glucose: 135 mg/dL — ABNORMAL HIGH (ref 65–99)
Potassium: 4.2 mmol/L (ref 3.5–5.2)
Sodium: 140 mmol/L (ref 134–144)
Total Protein: 6.9 g/dL (ref 6.0–8.5)

## 2018-08-31 LAB — MICROALBUMIN / CREATININE URINE RATIO
Creatinine, Urine: 30.5 mg/dL
Microalb/Creat Ratio: 26 mg/g creat (ref 0–29)
Microalbumin, Urine: 7.8 ug/mL

## 2018-08-31 LAB — HEMOGLOBIN A1C
Est. average glucose Bld gHb Est-mCnc: 128 mg/dL
Hgb A1c MFr Bld: 6.1 % — ABNORMAL HIGH (ref 4.8–5.6)

## 2018-08-31 LAB — TSH: TSH: 0.873 u[IU]/mL (ref 0.450–4.500)

## 2018-09-04 NOTE — Progress Notes (Addendum)
Start time: called 5-6 times starting at 10:40-11am (for 10:45 appointment time), went straight to VM Ultimately was able to reach her.  Start of call: 12:23pm End time: 12:45 pm  Virtual Visit via Telephon Note  I connected with Sierra Combs on 09/05/2018 by telephone (she did not have any access for a video visit), and verified that I am speaking with the correct person using two identifiers.  Location: Patient: home, husband is also at home, in different room. Provider: home office   I discussed the limitations of evaluation and management by telemedicine and the availability of in person appointments. The patient expressed understanding and agreed to proceed. She consents to [place] filing her insurance for this visit.  History of Present Illness:  Chief Complaint  Patient presents with  . Hypertension    phone call med check. Sugar this am qwas 92.    Hypertension.She continues on 20mg  oflisinopril,tolerating this without side effects. BP's have been running 120/84, 128/83, 128/79, 117/77  She is no longer eating out (due to pandemic), eating healthier overall.  Previously used to grab more fast food and see higher blood pressures afterwards.  She buys no salt added canned foods.  She reports we have previously verified her monitor as accurate. She denies dizziness, chest pain, cough, edema.  Hypothyroidism:She is compliant with taking 36mcg levothyroxine daily. She takes it on an empty stomach, separate from food and other medications.She denies any symptoms--no changes in energy, hair/skin/nails/bowels.   Diabetes follow-up: Blood sugars at home are running 90's to the low 100's (max 107 per patient) Sugar this morning was 92, 104 last night. She rarely checks it in the evenings. Since retiring, she cut back on her sugar, is drinking her coffee black now. Denies hypoglycemia,polydipsia and polyuria (urinary frequency improved after she cut sugar out of her coffee). Eye  exams aredoneyearly, last 01/2018. Patient follows a low sugar diet and checks feet regularly without concerns.Denies numbness, tingling or burningpain in the feet.  Compliant with metformin, denies side effects. H/o microalbuminuria, noted when she hadUTI;rechecks since have been normal.  Hyperlipidemia follow-up: Patient is reportedly following a low-fat, low cholesterol diet. Compliant with medications(atorvastatin, niaspan and fish oil)and denies medication side effects. No flushing or myalgias.Lipids were atgoal on last checkon this regimen. Lab Results  Component Value Date   CHOL 164 03/01/2018   HDL 66 03/01/2018   LDLCALC 89 03/01/2018   TRIG 43 03/01/2018   CHOLHDL 2.5 03/01/2018   She had some questions from insurance about the cost of the Niaspan.  She doesn't want to stop/switch with this now.  PMH, PSH, SH unchanged  Outpatient Encounter Medications as of 09/05/2018  Medication Sig Note  . aspirin 81 MG tablet Take 81 mg by mouth daily.     Marland Kitchen atorvastatin (LIPITOR) 80 MG tablet Take 1 tablet (80 mg total) by mouth daily.   . Calcium Carbonate-Vitamin D (CALCIUM 600 + D PO) Take 1 tablet by mouth 2 (two) times daily.     . fish oil-omega-3 fatty acids 1000 MG capsule Take 1 g by mouth daily.    Marland Kitchen levothyroxine (SYNTHROID, LEVOTHROID) 88 MCG tablet TAKE 1 TABLET BY MOUTH ONCE DAILY   . lisinopril (ZESTRIL) 20 MG tablet TAKE 1 TABLET BY MOUTH EVERY DAY   . metFORMIN (GLUCOPHAGE) 1000 MG tablet Take 1 tablet (1,000 mg total) by mouth 2 (two) times daily with a meal.   . niacin (NIASPAN) 1000 MG CR tablet Take 1 tablet (1,000 mg total)  by mouth at bedtime.   . [DISCONTINUED] diclofenac (VOLTAREN) 75 MG EC tablet Take 1 tablet (75 mg total) by mouth 2 (two) times daily. 03/01/2018: Taking per podiatrist   No facility-administered encounter medications on file as of 09/05/2018.    No Known Allergies  ROS: no fever, chills, headaches, dizziness, chest pain,  palpitations, cough, shortness of breath, edema, bleeding, bruising, rash, changes in hair/skin/nails/bowels, moods, energy or weight. See HPI.   Observations/Objective:  BP 117/77   Ht 5\' 3"  (1.6 m)   Wt 114 lb (51.7 kg)   BMI 20.19 kg/m   Wt Readings from Last 3 Encounters:  03/01/18 111 lb 9.6 oz (50.6 kg)  09/27/17 108 lb 6.4 oz (49.2 kg)  05/25/17 109 lb 3.2 oz (49.5 kg)   Exam is limited due to virtual/phone nature of visit. She is alert, oriented, and in good spirits.  Lab Results  Component Value Date   HGBA1C 6.1 (H) 08/30/2018   Fasting glucose 135    Chemistry      Component Value Date/Time   NA 140 08/30/2018 0815   K 4.2 08/30/2018 0815   CL 101 08/30/2018 0815   CO2 23 08/30/2018 0815   BUN 14 08/30/2018 0815   CREATININE 0.94 08/30/2018 0815   CREATININE 0.83 03/01/2017 0948      Component Value Date/Time   CALCIUM 9.9 08/30/2018 0815   ALKPHOS 68 08/30/2018 0815   AST 25 08/30/2018 0815   ALT 15 08/30/2018 0815   BILITOT 0.3 08/30/2018 0815     Lab Results  Component Value Date   TSH 0.873 08/30/2018   Urine microalbumin/Cr ratio 26   Assessment and Plan:  Type 2 diabetes mellitus without complication, without long-term current use of insulin ([place]) - well controlled - Plan: metFORMIN (GLUCOPHAGE) 1000 MG tablet  Hypothyroidism, unspecified type - adequately replaced on current dose, continue - Plan: levothyroxine (SYNTHROID) 88 MCG tablet  Pure hypercholesterolemia - previously at goal on current regimen, continue - Plan: atorvastatin (LIPITOR) 80 MG tablet, niacin (NIASPAN) 1000 MG CR tablet  Essential hypertension, benign - BP's controlled per home numbers. Cont 20mg  lisinopril and low sodium diet - Plan: lisinopril (ZESTRIL) 20 MG tablet  Counseled re: low sodium, low sugar, low-carb, diet, regular exercise. Her sugar at lab was much higher than when checked at home. Reviewed diet, goal values in detail. Discussed her lipids in detail,  what the niaspan does, and potential changes and how we would monitor if Niaspan was no longer covered or affordable (declined making any changes at this time).  F/u as scheduled for fasting physical in 02/2019  Follow Up Instructions:    I discussed the assessment and treatment plan with the patient. The patient was provided an opportunity to ask questions and all were answered. The patient agreed with the plan and demonstrated an understanding of the instructions.   The patient was advised to call back or seek an in-person evaluation if the symptoms worsen or if the condition fails to improve as anticipated.  I provided 22  minutes of non-face-to-face time during this encounter.  There was time spent in reviewing chart (as well as over 25 mins of time spent trying to call her during her actual appt time, when her phone must have been off the hook).   Vikki Ports, MD

## 2018-09-05 ENCOUNTER — Ambulatory Visit (INDEPENDENT_AMBULATORY_CARE_PROVIDER_SITE_OTHER): Payer: Medicare Other | Admitting: Family Medicine

## 2018-09-05 ENCOUNTER — Other Ambulatory Visit: Payer: Self-pay | Admitting: Family Medicine

## 2018-09-05 ENCOUNTER — Encounter: Payer: Self-pay | Admitting: Family Medicine

## 2018-09-05 ENCOUNTER — Other Ambulatory Visit: Payer: Self-pay

## 2018-09-05 VITALS — BP 117/77 | Ht 63.0 in | Wt 114.0 lb

## 2018-09-05 DIAGNOSIS — I10 Essential (primary) hypertension: Secondary | ICD-10-CM | POA: Diagnosis not present

## 2018-09-05 DIAGNOSIS — E039 Hypothyroidism, unspecified: Secondary | ICD-10-CM | POA: Diagnosis not present

## 2018-09-05 DIAGNOSIS — E119 Type 2 diabetes mellitus without complications: Secondary | ICD-10-CM

## 2018-09-05 DIAGNOSIS — E7800 Pure hypercholesterolemia, unspecified: Secondary | ICD-10-CM | POA: Diagnosis not present

## 2018-09-05 MED ORDER — LISINOPRIL 20 MG PO TABS: 20.0000 mg | ORAL_TABLET | Freq: Every day | ORAL | 0 refills | Status: DC

## 2018-09-05 MED ORDER — NIACIN ER (ANTIHYPERLIPIDEMIC) 1000 MG PO TBCR: 1000.0000 mg | EXTENDED_RELEASE_TABLET | Freq: Every day | ORAL | 5 refills | Status: DC

## 2018-09-05 MED ORDER — ATORVASTATIN CALCIUM 80 MG PO TABS: 80.0000 mg | ORAL_TABLET | Freq: Every day | ORAL | 5 refills | Status: DC

## 2018-09-05 MED ORDER — LEVOTHYROXINE SODIUM 88 MCG PO TABS: 88.0000 ug | ORAL_TABLET | Freq: Every day | ORAL | 5 refills | Status: DC

## 2018-09-05 MED ORDER — METFORMIN HCL 1000 MG PO TABS: 1000.0000 mg | ORAL_TABLET | Freq: Two times a day (BID) | ORAL | 5 refills | Status: DC

## 2018-09-05 NOTE — Patient Instructions (Signed)
Your diabetes is overall well controlled (though your fasting sugar was 135 the day you had your labs drawn). Lab Results  Component Value Date   HGBA1C 6.1 (H) 08/30/2018   Continue to limit the sugar in your diet, get regular exercise, and monitor your sugars at home. Contact us if your sugars are consistently over 140.  Your cholesterol levels were perfect on the last check in 02/2018, so continue all of your current medications.  If the cost/insurance coverage of Niaspan becomes an issue, we briefly mentioned changing from lipitor to Crestor, and rechecking afterwards.  But, for now, "if it ain't broke, don't fix it!" Lab Results  Component Value Date   CHOL 164 03/01/2018   HDL 66 03/01/2018   LDLCALC 89 03/01/2018   TRIG 43 03/01/2018   CHOLHDL 2.5 03/01/2018    Your blood pressure continues to be excellent.  Continue your low sodium diet and regular exercise.  I sent refills for all of your medications.   Please don't hesitate to contact us if you have any questions or concerns.

## 2018-12-02 ENCOUNTER — Other Ambulatory Visit: Payer: Self-pay | Admitting: Family Medicine

## 2018-12-02 DIAGNOSIS — I10 Essential (primary) hypertension: Secondary | ICD-10-CM

## 2019-01-18 DIAGNOSIS — H35033 Hypertensive retinopathy, bilateral: Secondary | ICD-10-CM | POA: Diagnosis not present

## 2019-01-18 DIAGNOSIS — H40033 Anatomical narrow angle, bilateral: Secondary | ICD-10-CM | POA: Diagnosis not present

## 2019-01-18 DIAGNOSIS — H5203 Hypermetropia, bilateral: Secondary | ICD-10-CM | POA: Diagnosis not present

## 2019-01-18 DIAGNOSIS — H40013 Open angle with borderline findings, low risk, bilateral: Secondary | ICD-10-CM | POA: Diagnosis not present

## 2019-01-18 DIAGNOSIS — H2513 Age-related nuclear cataract, bilateral: Secondary | ICD-10-CM | POA: Diagnosis not present

## 2019-01-18 LAB — HM DIABETES EYE EXAM

## 2019-02-06 DIAGNOSIS — R29890 Loss of height: Secondary | ICD-10-CM | POA: Diagnosis not present

## 2019-02-06 DIAGNOSIS — M8589 Other specified disorders of bone density and structure, multiple sites: Secondary | ICD-10-CM | POA: Diagnosis not present

## 2019-02-06 DIAGNOSIS — Z1231 Encounter for screening mammogram for malignant neoplasm of breast: Secondary | ICD-10-CM | POA: Diagnosis not present

## 2019-02-06 DIAGNOSIS — M069 Rheumatoid arthritis, unspecified: Secondary | ICD-10-CM | POA: Diagnosis not present

## 2019-02-06 LAB — HM MAMMOGRAPHY

## 2019-02-06 LAB — HM DEXA SCAN

## 2019-02-15 DIAGNOSIS — Z1159 Encounter for screening for other viral diseases: Secondary | ICD-10-CM | POA: Diagnosis not present

## 2019-02-28 ENCOUNTER — Other Ambulatory Visit: Payer: Self-pay | Admitting: Family Medicine

## 2019-02-28 DIAGNOSIS — E119 Type 2 diabetes mellitus without complications: Secondary | ICD-10-CM

## 2019-02-28 DIAGNOSIS — I10 Essential (primary) hypertension: Secondary | ICD-10-CM

## 2019-02-28 DIAGNOSIS — E7800 Pure hypercholesterolemia, unspecified: Secondary | ICD-10-CM

## 2019-03-01 ENCOUNTER — Other Ambulatory Visit: Payer: Self-pay | Admitting: Family Medicine

## 2019-03-01 DIAGNOSIS — E7800 Pure hypercholesterolemia, unspecified: Secondary | ICD-10-CM

## 2019-03-01 DIAGNOSIS — E039 Hypothyroidism, unspecified: Secondary | ICD-10-CM

## 2019-03-01 NOTE — Telephone Encounter (Signed)
Patient has appointment on 03/04/19 and does not currently need refills on the pending meds

## 2019-03-02 NOTE — Progress Notes (Addendum)
Chief Complaint  Patient presents with  . Medicare Wellness    Welcome to medicare visit, fasting. No new concerns. Has colonoscopy scheduled but ate-so she is rescheduled for next month.     Sierra Combs is a 68 y.o. female who presents for Welcome to Medicare visit and follow-up on chronic medical conditions.    Hypertension.She continues on 69m oflisinopril,tolerating this without side effects.BP's have been running 122-142/68-81, pulse 80-95 (checked 2x/day for 5 days, 12/16-20.  Only 2/10 values were >140, all others 135 or less, 5 in the 120's).  She is no longer eating out (due to pandemic), eating healthier overall.  Previously used to grab more fast food and see higher blood pressures afterwards.  She buys no salt added canned foods. She continues to "watch what she eats". She reports we have previously verified her monitor as accurate. She denies dizziness, chest pain, cough, edema.  Hypothyroidism:She is compliant with taking 869m levothyroxine daily. She takes it on an empty stomach, separate from food and other medications.She denies any symptoms--no changes in energy, hair/skin/nails/bowels. She has lost some weight. Lab Results  Component Value Date   TSH 0.873 08/30/2018   Diabetes follow-up: Blood sugars at home are running <100 in the mornings, not checking other times of the day. Since retiring, she cut back on her sugar, is drinking her coffee black now. Denies hypoglycemia,polydipsia and polyuria (urinary frequency improved after she cut sugar out of her coffee).  Eye exams aredoneyearly, last 01/2019 (report not received). They are keeping an eye on the pressure in her left eye. Scheduled for a 6 month follow-up rather than a year. Patient follows a low sugar diet and checks feet regularly without concerns.Denies numbness, tingling or burningpain in the feet.  Compliant with metformin, denies side effects. H/o microalbuminuria, noted when she  hadUTI;rechecks since have been normal.  Hyperlipidemia follow-up: Patient is reportedly following a low-fat, low cholesterol diet. Compliant with medications(atorvastatin, niaspan and fish oil)and denies medication side effects. No flushing or myalgias.Lipids were atgoal on last checkon this regimen. Lab Results  Component Value Date   CHOL 164 03/01/2018   HDL 66 03/01/2018   LDLCALC 89 03/01/2018   TRIG 43 03/01/2018   CHOLHDL 2.5 03/01/2018   Osteopenia: She had DEXA in 05/2012, which showed T-1.3. She had been on Fosamax for many years (>5), so it was stopped after that DEXA.  She last had DEXA 01/2019, stable at hips, T-1.7 at R fem neck.Vitamin D level was normal at 55 in 05/2012.    Immunization History  Administered Date(s) Administered  . Influenza Split 02/10/2012  . Influenza, High Dose Seasonal PF 02/15/2017, 02/02/2018  . Influenza-Unspecified 01/13/2015, 02/01/2016  . Pneumococcal Conjugate-13 02/25/2016  . Pneumococcal Polysaccharide-23 05/04/2007, 03/01/2017  . Td 06/17/1997  . Tdap 05/04/2007, 12/17/2012  . Zoster 12/30/2013   Last Pap smear:01/2015; no high risk HPV detected Last mammogram:01/2019 Last colonoscopy: 11/2013 with Dr. GaPenelope Coopdue again 11/2018)--had to r/s after she ate the day of her scheduled colonoscopy, rescheduled for next month. Last DEXA:01/2019, T-1.7 R fem neck Dentist: went recently. Ophtho: yearly (last month),Dr. WeKathlen Modyxercise: occasional walk. She pulled a muscle in her arms with weights, so stopped using them. Plans to start walking   Other doctors caring for patient: Ophtho: Dr. WeKathlen ModyI: Dr. GaPenelope Cooprtho: can't recall who she saw, plans to see another in future, when needed Dentist:[facility] (doesn't recall name of doctor) Podiatrist: Dr. RePaulla DollyShe does not having a Living Will or Healthcare  power of attorney.Given paperwork again last year. "We are not going to do that right now".  Fall screen  negative Depression screen negative Functional status survey: no vision changes noted by pt; some R knee pain; denies memory concerns (misunderstood when nurse asked questions) Mini-Cog screen: 4/5 (missed 1 on recall)--remembered all 3 words when MD came into room 10-15 minutes later. See full questionnaires in Epic  Past Medical History:  Diagnosis Date  . Breast cyst 3cm L breast  . Colonic polyp   . Diabetes mellitus   . Diverticulosis   . Fatty liver   . Hyperlipidemia small particles 6/06  . Hypertension   . Menopause 2002  . Osteopenia L hip  . Unspecified hypothyroidism 09/09/2010    Past Surgical History:  Procedure Laterality Date  . BREAST BIOPSY  7/06 L breast benign  . CESAREAN SECTION    . COLONOSCOPY  6/05, 09/2008, 11/2013   Dr. Penelope Coop    Social History   Socioeconomic History  . Marital status: Married    Spouse name: Not on file  . Number of children: 1  . Years of education: Not on file  . Highest education level: Not on file  Occupational History  . Occupation: Microbiologist: [employer]  . Financial resource strain: Not on file  . Food insecurity    Worry: Not on file    Inability: Not on file  . Transportation needs    Medical: Not on file    Non-medical: Not on file  Tobacco Use  . Smoking status: Never Smoker  . Smokeless tobacco: Never Used  . Tobacco comment: passive tobacco exposure from husband  Substance and Sexual Activity  . Alcohol use: No  . Drug use: No  . Sexual activity: Yes    Partners: Male  Lifestyle  . Physical activity    Days per week: Not on file    Minutes per session: Not on file  . Stress: Not on file  Relationships  . Social Herbalist on phone: Not on file    Gets together: Not on file    Attends religious service: Not on file    Active member of club or organization: Not on file    Attends meetings of clubs or organizations: Not on file     Relationship status: Not on file  . Intimate partner violence    Fear of current or ex partner: Not on file    Emotionally abused: Not on file    Physically abused: Not on file    Forced sexual activity: Not on file  Other Topics Concern  . Not on file  Social History Narrative   Lives with husband, no pets.  Daughter lives in [place].  +passive tobacco exposure from her husband (and smokes inside)    Family History  Problem Relation Age of Onset  . Diabetes Mother   . Heart disease Mother   . Hyperlipidemia Mother   . Hypertension Mother   . Diabetes Father   . Heart disease Father   . Hyperlipidemia Father   . Hypertension Father   . Diabetes Sister   . Diabetes Brother        legs amputated  . Kidney disease Brother        on dialysis  . Diabetes Sister   . Lung cancer Sister        not smoker; +exposure from husband; works in Proofreader  .  Cancer Sister        lung, stage 4    Outpatient Encounter Medications as of 03/04/2019  Medication Sig  . aspirin 81 MG tablet Take 81 mg by mouth daily.    Marland Kitchen atorvastatin (LIPITOR) 80 MG tablet Take 1 tablet (80 mg total) by mouth daily.  . Calcium Carbonate-Vitamin D (CALCIUM 600 + D PO) Take 1 tablet by mouth 2 (two) times daily.    . fish oil-omega-3 fatty acids 1000 MG capsule Take 1 g by mouth daily.   Marland Kitchen levothyroxine (SYNTHROID) 88 MCG tablet Take 1 tablet (88 mcg total) by mouth daily.  Marland Kitchen lisinopril (ZESTRIL) 20 MG tablet TAKE 1 TABLET(20 MG) BY MOUTH DAILY  . metFORMIN (GLUCOPHAGE) 1000 MG tablet Take 1 tablet (1,000 mg total) by mouth 2 (two) times daily with a meal.  . niacin (NIASPAN) 1000 MG CR tablet Take 1 tablet (1,000 mg total) by mouth at bedtime.   No facility-administered encounter medications on file as of 03/04/2019.     No Known Allergies  ROS: The patient denies anorexia, fever, headaches, vision changes, decreased hearing, ear pain, sore throat, breast concerns, chest pain, palpitations, dizziness,  syncope, dyspnea on exertion, cough, swelling, nausea, vomiting, diarrhea, constipation, abdominal pain, melena, hematochezia, indigestion/heartburn, hematuria,incontinence, dysuria, vaginal bleeding, discharge, odor or itch, genital lesions, numbness, tingling, weakness, tremor, suspicious skin lesions, depression, anxiety, abnormal bleeding/bruising or enlarged lymph nodes.  Some vaginal dryness (very mild). Mild hot flashes. Right kneearthritis, intermittentpain, not bothering her much recently (only if a lot of stairs or walking)   PHYSICAL EXAM:  BP 138/82   Pulse 100   Temp 98.2 F (36.8 C) (Other (Comment))   Ht 5' 2.5" (1.588 m)   Wt 103 lb 12.8 oz (47.1 kg)   BMI 18.68 kg/m   Wt Readings from Last 3 Encounters:  03/04/19 103 lb 12.8 oz (47.1 kg)  09/05/18 114 lb (51.7 kg)  03/01/18 111 lb 9.6 oz (50.6 kg)   124/80 on repeat by MD still tachycardic  General Appearance:  Alert, cooperative, no distress, appears stated age   Head:  Normocephalic, without obvious abnormality, atraumatic   Eyes:  PERRL, conjunctiva/corneas clear, EOM's intact, fundi benign   Ears:  Normal TM's and external ear canals  Nose:  Not examined, wearing mask due to COVID-19 pandemic   Throat:  Not examined, wearing mask due to COVID-19 pandemic   Neck:  Supple, no lymphadenopathy; thyroid: no enlargement/tenderness/ nodules; no carotid bruit or JVD   Back:  Spine nontender, no curvature, ROM normal, no CVA tenderness   Lungs:  Clear to auscultation bilaterally without wheezes, rales or ronchi; respirations unlabored   Chest Wall:  No tenderness or deformity   Heart:  Tachycardic (rate 100-105), S1 and S2 normal, no murmur, rub or gallop   Breast Exam:  No tenderness, masses, or nipple discharge or inversion. No axillary lymphadenopathy. Nontender, no masses..   Abdomen:  Soft, non-tender, nondistended, normoactive bowel sounds, no masses, no hepatosplenomegaly  Genitalia:   Normal external genitalia without lesions. Mild atrophic changes and vaginal dryness. BUS and vagina normal; no cervical motion tenderness. No abnormal vaginal discharge. Uterus and adnexa not enlarged, nontender, no masses. Papnotperformed   Rectal:  Normal tone, no masses or tenderness;heme negative stool  Extremities:  No clubbing, cyanosis or edema.   Pulses:  2+ and symmetric all extremities   Skin:  Skin color, texture, turgor normal, no rashes or lesions.  Lymph nodes:  Cervical, supraclavicular, and axillary nodes  normal   Neurologic:  CNII-XII intact, normal strength, sensation and gait; reflexes 2+ and symmetric throughout   Psych: Normal mood, affect, hygiene and grooming  Normal diabetic foot exam.  Lab Results  Component Value Date   HGBA1C 5.7 (A) 03/04/2019    EKG: sinus tach, rate of 105.  Borderline LAE   ASSESSMENT/PLAN:  Welcome to Medicare preventive visit - Plan: EKG 12-Lead  Essential hypertension, benign - well controlled - Plan: Comprehensive metabolic panel, lisinopril (ZESTRIL) 20 MG tablet  Pure hypercholesterolemia - continue statin; prev at goal, due for labs today - Plan: Lipid panel, atorvastatin (LIPITOR) 80 MG tablet, niacin (NIASPAN) 1000 MG CR tablet  Hypothyroidism, unspecified type - concern that she may be over-replaced on current dose (weight loss, tachycardia).  Check TSH today and adjust per results - Plan: TSH  Osteopenia, unspecified location - discussed Ca, D and weight-bearing exercise.  Recheck 3 years  Controlled type 2 diabetes mellitus with other specified complication, without long-term current use of insulin (HCC) - cont metformin - Plan: HgB A1c, EKG 12-Lead  Hypertension associated with diabetes ([place]) - Plan: EKG 12-Lead  Need for influenza vaccination - Plan: Flu Vaccine QUAD High Dose(Fluad)  Medication monitoring encounter - Plan: Comprehensive metabolic panel, Lipid panel, CBC with  Differential, TSH  Type 2 diabetes mellitus without complication, without long-term current use of insulin ([place]) - well controlled - Plan: metFORMIN (GLUCOPHAGE) 1000 MG tablet  Refill thyroid medication after labs back. To get ophtho records from Dr. Kathlen Mody from 01/2019.  c-met, lipids, CBC, TSH   Discussed monthly self breast exams and yearly mammograms; at least 30 minutes of aerobic activity at least 5 days/week and weight bearing exercise at least 2x/wk; proper sunscreen use reviewed; healthy diet, including goals of calcium and vitamin D intake and alcohol recommendations (less than or equal to 1 drink/day) reviewed; regular seatbelt use; changing batteries in smoke detectors. Immunization recommendations discussed--high dose flu shot given.Shingrix recommended, to get at pharmacy, risks/SE reviewed.Colonoscopy recommendations reviewed--is scheduled for next month. Pap smear next year.  DEXA can wait 3 years to repeat, since has been stable.  Full Code, Full Care. Encouraged to get Living Will and Healthcare power of attorneys filled out.  Discussed what they were for, and encouraged to discuss with family, fill out, notarize.  F/u 6 mos for med check, 1 year for CPE/AWV   Medicare Attestation I have personally reviewed: The patient's medical and social history Their use of alcohol, tobacco or illicit drugs Their current medications and supplements The patient's functional ability including ADLs,fall risks, home safety risks, cognitive, and hearing and visual impairment Diet and physical activities Evidence for depression or mood disorders  The patient's weight, height, BMI have been recorded in the chart.  I have made referrals, counseling, and provided education to the patient based on review of the above and I have provided the patient with a written personalized care plan for preventive services.    ADDENDUM: Lab Results  Component Value Date   TSH 0.214 (L) 03/04/2019    Dose lowered to 81mg.  Recheck in 6-8 weeks.

## 2019-03-03 NOTE — Patient Instructions (Addendum)
HEALTH MAINTENANCE RECOMMENDATIONS:  It is recommended that you get at least 30 minutes of aerobic exercise at least 5 days/week (for weight loss, you may need as much as 60-90 minutes). This can be any activity that gets your heart rate up. This can be divided in 10-15 minute intervals if needed, but try and build up your endurance at least once a week.  Weight bearing exercise is also recommended twice weekly.  Eat a healthy diet with lots of vegetables, fruits and fiber.  "Colorful" foods have a lot of vitamins (ie green vegetables, tomatoes, red peppers, etc).  Limit sweet tea, regular sodas and alcoholic beverages, all of which has a lot of calories and sugar.  Up to 1 alcoholic drink daily may be beneficial for women (unless trying to lose weight, watch sugars).  Drink a lot of water.  Calcium recommendations are 1200-1500 mg daily (1500 mg for postmenopausal women or women without ovaries), and vitamin D 1000 IU daily.  This should be obtained from diet and/or supplements (vitamins), and calcium should not be taken all at once, but in divided doses.  Monthly self breast exams and yearly mammograms for women over the age of 1 is recommended.  Sunscreen of at least SPF 30 should be used on all sun-exposed parts of the skin when outside between the hours of 10 am and 4 pm (not just when at beach or pool, but even with exercise, golf, tennis, and yard work!)  Use a sunscreen that says "broad spectrum" so it covers both UVA and UVB rays, and make sure to reapply every 1-2 hours.  Remember to change the batteries in your smoke detectors when changing your clock times in the spring and fall. Carbon monoxide detectors are recommended for your home.  Use your seat belt every time you are in a car, and please drive safely and not be distracted with cell phones and texting while driving.   [name] , Thank you for taking time to come for your Welcome to Medicare Visit. I appreciate your ongoing  commitment to your health goals. Please review the following plan we discussed and let me know if I can assist you in the future.    This is a list of the screening recommended for you and due dates:  Health Maintenance  Topic Date Due  . Flu Shot  11/10/2018  . Colon Cancer Screening  11/16/2018  . Eye exam for diabetics  01/17/2019  . Complete foot exam   03/02/2019  . Hemoglobin A1C  03/02/2019  . Mammogram  02/06/2020  . Tetanus Vaccine  12/18/2022  . DEXA scan (bone density measurement)  Completed  .  Hepatitis C: One time screening is recommended by Center for Disease Control  (CDC) for  adults born from [birth year] through 1965.   Completed  . Pneumonia vaccines  Completed   You were given flu shot today. We will call Dr. Kathleen Argue office to get the eye exam report from your visit.  Hemoglobin A1c and foot exams were performed today.  Get your colonoscopy as scheduled for next month.  We can recheck bone density in another 3 years (since your bone density has been stable--[facility] may ask you to recheck in 2 years, but I think it is fine to wait a little longer).  Continue routine dental cleanings.  I recommend getting the new shingles vaccine (Shingrix). You will need to get this from the pharmacy, as it is covered by Medicare Part D. It is a  series of 2 injections, spaced 2 months apart.

## 2019-03-04 ENCOUNTER — Other Ambulatory Visit: Payer: Self-pay

## 2019-03-04 ENCOUNTER — Encounter: Payer: Self-pay | Admitting: Family Medicine

## 2019-03-04 ENCOUNTER — Ambulatory Visit (INDEPENDENT_AMBULATORY_CARE_PROVIDER_SITE_OTHER): Payer: Medicare Other | Admitting: Family Medicine

## 2019-03-04 VITALS — BP 124/80 | HR 100 | Temp 98.2°F | Ht 62.5 in | Wt 103.8 lb

## 2019-03-04 DIAGNOSIS — M8580 Other specified disorders of bone density and structure, unspecified site: Secondary | ICD-10-CM | POA: Diagnosis not present

## 2019-03-04 DIAGNOSIS — I10 Essential (primary) hypertension: Secondary | ICD-10-CM

## 2019-03-04 DIAGNOSIS — E7800 Pure hypercholesterolemia, unspecified: Secondary | ICD-10-CM | POA: Diagnosis not present

## 2019-03-04 DIAGNOSIS — Z5181 Encounter for therapeutic drug level monitoring: Secondary | ICD-10-CM | POA: Diagnosis not present

## 2019-03-04 DIAGNOSIS — E1169 Type 2 diabetes mellitus with other specified complication: Secondary | ICD-10-CM | POA: Diagnosis not present

## 2019-03-04 DIAGNOSIS — Z0000 Encounter for general adult medical examination without abnormal findings: Secondary | ICD-10-CM | POA: Diagnosis not present

## 2019-03-04 DIAGNOSIS — E1159 Type 2 diabetes mellitus with other circulatory complications: Secondary | ICD-10-CM | POA: Diagnosis not present

## 2019-03-04 DIAGNOSIS — E039 Hypothyroidism, unspecified: Secondary | ICD-10-CM | POA: Diagnosis not present

## 2019-03-04 DIAGNOSIS — Z23 Encounter for immunization: Secondary | ICD-10-CM

## 2019-03-04 DIAGNOSIS — I152 Hypertension secondary to endocrine disorders: Secondary | ICD-10-CM

## 2019-03-04 DIAGNOSIS — E119 Type 2 diabetes mellitus without complications: Secondary | ICD-10-CM

## 2019-03-04 LAB — POCT GLYCOSYLATED HEMOGLOBIN (HGB A1C): Hemoglobin A1C: 5.7 % — AB (ref 4.0–5.6)

## 2019-03-04 MED ORDER — NIACIN ER (ANTIHYPERLIPIDEMIC) 1000 MG PO TBCR: 1000.0000 mg | EXTENDED_RELEASE_TABLET | Freq: Every day | ORAL | 1 refills | Status: DC

## 2019-03-04 MED ORDER — ATORVASTATIN CALCIUM 80 MG PO TABS: 80.0000 mg | ORAL_TABLET | Freq: Every day | ORAL | 1 refills | Status: DC

## 2019-03-04 MED ORDER — METFORMIN HCL 1000 MG PO TABS: 1000.0000 mg | ORAL_TABLET | Freq: Two times a day (BID) | ORAL | 1 refills | Status: DC

## 2019-03-04 MED ORDER — LISINOPRIL 20 MG PO TABS: ORAL_TABLET | ORAL | 1 refills | Status: DC

## 2019-03-05 ENCOUNTER — Other Ambulatory Visit: Payer: Self-pay | Admitting: Family Medicine

## 2019-03-05 ENCOUNTER — Other Ambulatory Visit: Payer: Self-pay

## 2019-03-05 DIAGNOSIS — Z5181 Encounter for therapeutic drug level monitoring: Secondary | ICD-10-CM

## 2019-03-05 DIAGNOSIS — E039 Hypothyroidism, unspecified: Secondary | ICD-10-CM

## 2019-03-05 LAB — COMPREHENSIVE METABOLIC PANEL
ALT: 13 IU/L (ref 0–32)
AST: 21 IU/L (ref 0–40)
Albumin/Globulin Ratio: 1.6 (ref 1.2–2.2)
Albumin: 4.1 g/dL (ref 3.8–4.8)
Alkaline Phosphatase: 63 IU/L (ref 39–117)
BUN/Creatinine Ratio: 20 (ref 12–28)
BUN: 19 mg/dL (ref 8–27)
Bilirubin Total: 0.3 mg/dL (ref 0.0–1.2)
CO2: 24 mmol/L (ref 20–29)
Calcium: 9.9 mg/dL (ref 8.7–10.3)
Chloride: 100 mmol/L (ref 96–106)
Creatinine, Ser: 0.94 mg/dL (ref 0.57–1.00)
GFR calc Af Amer: 72 mL/min/{1.73_m2} (ref >59)
GFR calc non Af Amer: 63 mL/min/{1.73_m2} (ref >59)
Globulin, Total: 2.6 g/dL (ref 1.5–4.5)
Glucose: 84 mg/dL (ref 65–99)
Potassium: 4.3 mmol/L (ref 3.5–5.2)
Sodium: 142 mmol/L (ref 134–144)
Total Protein: 6.7 g/dL (ref 6.0–8.5)

## 2019-03-05 LAB — CBC WITH DIFFERENTIAL/PLATELET
Basophils Absolute: 0 10*3/uL (ref 0.0–0.2)
Basos: 0 %
EOS (ABSOLUTE): 0 10*3/uL (ref 0.0–0.4)
Eos: 0 %
Hematocrit: 36.2 % (ref 34.0–46.6)
Hemoglobin: 12.2 g/dL (ref 11.1–15.9)
Immature Grans (Abs): 0 10*3/uL (ref 0.0–0.1)
Immature Granulocytes: 0 %
Lymphocytes Absolute: 1.9 10*3/uL (ref 0.7–3.1)
Lymphs: 23 %
MCH: 30.3 pg (ref 26.6–33.0)
MCHC: 33.7 g/dL (ref 31.5–35.7)
MCV: 90 fL (ref 79–97)
Monocytes Absolute: 0.4 10*3/uL (ref 0.1–0.9)
Monocytes: 4 %
Neutrophils Absolute: 6 10*3/uL (ref 1.4–7.0)
Neutrophils: 73 %
Platelets: 317 10*3/uL (ref 150–450)
RBC: 4.03 x10E6/uL (ref 3.77–5.28)
RDW: 12.6 % (ref 11.7–15.4)
WBC: 8.3 10*3/uL (ref 3.4–10.8)

## 2019-03-05 LAB — LIPID PANEL
Chol/HDL Ratio: 2 ratio (ref 0.0–4.4)
Cholesterol, Total: 130 mg/dL (ref 100–199)
HDL: 64 mg/dL (ref >39)
LDL Chol Calc (NIH): 56 mg/dL (ref 0–99)
Triglycerides: 38 mg/dL (ref 0–149)
VLDL Cholesterol Cal: 10 mg/dL (ref 5–40)

## 2019-03-05 LAB — TSH: TSH: 0.214 u[IU]/mL — ABNORMAL LOW (ref 0.450–4.500)

## 2019-03-05 MED ORDER — LEVOTHYROXINE SODIUM 75 MCG PO TABS: 75.0000 ug | ORAL_TABLET | Freq: Every day | ORAL | 1 refills | Status: DC

## 2019-03-05 NOTE — Telephone Encounter (Signed)
Pt insurance is requesting 90 days on levothryoxine

## 2019-03-05 NOTE — Addendum Note (Signed)
Addended by: Rita Ohara on: 03/05/2019 08:55 AM   Modules accepted: Orders

## 2019-03-25 DIAGNOSIS — Z1159 Encounter for screening for other viral diseases: Secondary | ICD-10-CM | POA: Diagnosis not present

## 2019-03-28 DIAGNOSIS — Z86010 Personal history of colonic polyps: Secondary | ICD-10-CM | POA: Diagnosis not present

## 2019-03-28 DIAGNOSIS — D128 Benign neoplasm of rectum: Secondary | ICD-10-CM | POA: Diagnosis not present

## 2019-03-28 DIAGNOSIS — K5730 Diverticulosis of large intestine without perforation or abscess without bleeding: Secondary | ICD-10-CM | POA: Diagnosis not present

## 2019-03-28 LAB — HM COLONOSCOPY

## 2019-04-02 DIAGNOSIS — D128 Benign neoplasm of rectum: Secondary | ICD-10-CM | POA: Diagnosis not present

## 2019-04-16 ENCOUNTER — Other Ambulatory Visit: Payer: Medicare Other

## 2019-04-16 ENCOUNTER — Other Ambulatory Visit: Payer: Self-pay

## 2019-04-16 DIAGNOSIS — E039 Hypothyroidism, unspecified: Secondary | ICD-10-CM | POA: Diagnosis not present

## 2019-04-16 DIAGNOSIS — Z5181 Encounter for therapeutic drug level monitoring: Secondary | ICD-10-CM

## 2019-04-17 ENCOUNTER — Other Ambulatory Visit: Payer: Self-pay | Admitting: *Deleted

## 2019-04-17 DIAGNOSIS — E039 Hypothyroidism, unspecified: Secondary | ICD-10-CM

## 2019-04-17 LAB — TSH: TSH: 1.24 u[IU]/mL (ref 0.450–4.500)

## 2019-04-17 MED ORDER — LEVOTHYROXINE SODIUM 75 MCG PO TABS: 75.0000 ug | ORAL_TABLET | Freq: Every day | ORAL | 1 refills | Status: DC

## 2019-04-17 NOTE — Progress Notes (Signed)
Advise pt--current thyroid dose (41mcg) is correct.  Hopefully she is feeling good (heart rate lower, not continuing to lose weight).  Any concerns, schedule visit, otherwise we will see her end of May and check thyroid again at that time.  She will need a refill by the end of this month.  See if she wants 30 vs 90, and can refill through next appt.  Thanks

## 2019-05-06 ENCOUNTER — Encounter: Payer: Self-pay | Admitting: *Deleted

## 2019-05-10 ENCOUNTER — Telehealth: Payer: Self-pay | Admitting: Family Medicine

## 2019-05-10 NOTE — Telephone Encounter (Signed)
Received requested records from [facility]

## 2019-05-13 ENCOUNTER — Encounter: Payer: Self-pay | Admitting: Family Medicine

## 2019-05-27 ENCOUNTER — Other Ambulatory Visit: Payer: Self-pay | Admitting: Family Medicine

## 2019-05-27 DIAGNOSIS — E039 Hypothyroidism, unspecified: Secondary | ICD-10-CM

## 2019-05-27 DIAGNOSIS — E7800 Pure hypercholesterolemia, unspecified: Secondary | ICD-10-CM

## 2019-06-07 ENCOUNTER — Ambulatory Visit: Payer: PRIVATE HEALTH INSURANCE | Attending: Internal Medicine

## 2019-06-07 DIAGNOSIS — Z23 Encounter for immunization: Secondary | ICD-10-CM | POA: Insufficient documentation

## 2019-06-07 NOTE — Progress Notes (Signed)
   Covid-19 Vaccination Clinic  Name:  Sierra Combs    MRN: [id number] DOB: 12-Jul-1950  06/07/2019  Sierra Combs was observed post Covid-19 immunization for 15 minutes without incidence. She was provided with Vaccine Information Sheet and instruction to access the V-Safe system.   Sierra Combs was instructed to call 911 with any severe reactions post vaccine: Marland Kitchen Difficulty breathing  . Swelling of your face and throat  . A fast heartbeat  . A bad rash all over your body  . Dizziness and weakness    Immunizations Administered    Name Date Dose VIS Date Route   Pfizer COVID-19 Vaccine 06/07/2019 12:51 PM 0.3 mL 03/22/2019 Intramuscular   Manufacturer: [place]   Lot: [id number]   [place]: [id number]

## 2019-07-02 ENCOUNTER — Ambulatory Visit: Payer: PRIVATE HEALTH INSURANCE | Attending: Internal Medicine

## 2019-07-02 DIAGNOSIS — Z23 Encounter for immunization: Secondary | ICD-10-CM

## 2019-07-02 NOTE — Progress Notes (Signed)
   Covid-19 Vaccination Clinic  Name:  HEMEN BOARDLEY    MRN: [id number] DOB: [birth year]  07/02/2019  Ms. Venezia was observed post Covid-19 immunization for 15 minutes without incident. She was provided with Vaccine Information Sheet and instruction to access the V-Safe system.   Ms. Waechter was instructed to call 911 with any severe reactions post vaccine: Marland Kitchen Difficulty breathing  . Swelling of face and throat  . A fast heartbeat  . A bad rash all over body  . Dizziness and weakness   Immunizations Administered    Name Date Dose VIS Date Route   Pfizer COVID-19 Vaccine 07/02/2019  3:54 PM 0.3 mL 03/22/2019 Intramuscular   Manufacturer: [place]   Lot: [id number]   [place]: [id number]

## 2019-07-23 DIAGNOSIS — H40033 Anatomical narrow angle, bilateral: Secondary | ICD-10-CM | POA: Diagnosis not present

## 2019-07-23 DIAGNOSIS — H40013 Open angle with borderline findings, low risk, bilateral: Secondary | ICD-10-CM | POA: Diagnosis not present

## 2019-07-23 DIAGNOSIS — H35033 Hypertensive retinopathy, bilateral: Secondary | ICD-10-CM | POA: Diagnosis not present

## 2019-07-23 DIAGNOSIS — H35013 Changes in retinal vascular appearance, bilateral: Secondary | ICD-10-CM | POA: Diagnosis not present

## 2019-07-23 LAB — HM DIABETES EYE EXAM

## 2019-08-07 ENCOUNTER — Telehealth: Payer: Self-pay | Admitting: Family Medicine

## 2019-08-07 NOTE — Telephone Encounter (Signed)
Lab Results  Component Value Date   TSH 1.240 04/16/2019   Per result note after labs in 04/2019: Advise pt--current thyroid dose (67mcg) is correct. Hopefully she is feeling good (heart rate lower, not continuing to lose weight). Any concerns, schedule visit, otherwise we will see her end of May and check thyroid again at that time.   She was feeling good at that time. Up to her if she wants to wait until her visit next month, vs coming sooner for recheck. Ensure she is eating enough, and that she isn't having palpitations, changes to hair/skin/bowels or other thyroid symptoms (tachycardia, frequent bowel movement, greasy hair/skin, fatigue, etc).

## 2019-08-07 NOTE — Telephone Encounter (Signed)
Pt called and said she feels like she has lost some weight since she has been taking this dose of synthroid. She said she was told to call you if she noticed this change

## 2019-08-07 NOTE — Telephone Encounter (Signed)
Spoke with patient and she states she has NOT lost any weight, just feels like the bones in her chest are sticking out-thinks maybe because she is getting older. She is not having any thyroid symptoms at all. She said she actually likes the 67mcg dosage, says her heart feels calm. She wants to wait until her 5/27 visit. I told her is she changes her mind to let me know.

## 2019-08-07 NOTE — Telephone Encounter (Signed)
Left message for patient to please call me back. 

## 2019-08-24 ENCOUNTER — Other Ambulatory Visit: Payer: Self-pay | Admitting: Family Medicine

## 2019-08-24 DIAGNOSIS — E7800 Pure hypercholesterolemia, unspecified: Secondary | ICD-10-CM

## 2019-08-28 ENCOUNTER — Other Ambulatory Visit: Payer: Self-pay | Admitting: Family Medicine

## 2019-08-28 DIAGNOSIS — E119 Type 2 diabetes mellitus without complications: Secondary | ICD-10-CM

## 2019-08-28 DIAGNOSIS — E7800 Pure hypercholesterolemia, unspecified: Secondary | ICD-10-CM

## 2019-08-28 DIAGNOSIS — I10 Essential (primary) hypertension: Secondary | ICD-10-CM

## 2019-09-04 ENCOUNTER — Encounter: Payer: Medicare Other | Admitting: Family Medicine

## 2019-09-04 NOTE — Progress Notes (Signed)
  Patient presents for 6 month med check.  Hypertension.She continues on 20mg  oflisinopril,tolerating this without side effects. BP's have been running  She continues to limit fast food, and buys no added salt canned foods. She denies dizziness, chest pain, cough, edema, headaches.  Hypothyroidism:Her thyroid dose was decreased from 88 to 75 mcg in 02/2019, due to low TSH.  Recheck was normal. She reports compliance with taking 49mcg levothyroxine daily, on empty stomach, separate from food and other medications. While she has noted slight weight loss, she denies changes in energy, hair/skin/nails/bowels. She is due for recheck of TSH today Lab Results  Component Value Date   TSH 1.240 04/16/2019   Diabetes follow-up: She is compliant with metformin and denies side effects. Blood sugars at home are running <100 inthe mornings, not checking other times of the day. Denies hypoglycemia,polydipsia and polyuria(urinary frequency improved after she cut sugar out of her coffee).  Eye exams aredoneyearly, last 07/2019, no retinopathy. Patient follows a low sugar diet and checks feet regularly without concerns.Denies numbness, tingling or burningpain in the feet.  H/o microalbuminuria, noted when she hadUTI;rechecks since have been normal. Lab Results  Component Value Date   HGBA1C 5.7 (A) 03/04/2019    Hyperlipidemia follow-up: Patient is reportedly following a low-fat, low cholesterol diet. Compliant with medications(atorvastatin, niaspan and fish oil)and denies medication side effects. No flushing or myalgias.Lipids were atgoal on last checkon this regimen. Lab Results  Component Value Date   CHOL 130 03/04/2019   HDL 64 03/04/2019   LDLCALC 56 03/04/2019   TRIG 38 03/04/2019   CHOLHDL 2.0 03/04/2019    PMH, PSH, SH reviewed    ROS: no fever, chills, URI symptoms, headaches, dizziness, chest pain, shortness of breath, edema. She denies any GI or GU complaints. No  bleeding, bruising, rashes.  Moods are good.  No changes to energy, hair/skin.  Weight loss?   PHYSICAL EXAM:  Wt Readings from Last 3 Encounters:  03/04/19 103 lb 12.8 oz (47.1 kg)  09/05/18 114 lb (51.7 kg)  03/01/18 111 lb 9.6 oz (50.6 kg)   Well developed, pleasant female in no distress, in good spirits HEENT: PERRL ,EOMI, conjunctiva and sclera are clear, wearing mask Neck: no lymphadenopathy, thyromegaly or mass, no carotid bruit Heart: regular rate and rhythm without murmur  Lungs: clear bilaterally  Back: no CVA or spinal tenderness  Abdomen: soft, nontender, no organomegaly or mass  Extremities: no edema, 2+ pulse.  Skin: no rash, normal sensation Psych: normal mood, affect, hygiene and grooming.  Neuro: alert and oriented, normal gait.   ASSESSMENT/PLAN:  A1c, TSH, urine microalb   RF lisinopril, metformin, niaspan lipitor only RF #30 10d ago--give 90? Thyroid okay for now  F/u as scheduled in December

## 2019-09-05 ENCOUNTER — Other Ambulatory Visit: Payer: Self-pay

## 2019-09-05 ENCOUNTER — Ambulatory Visit (INDEPENDENT_AMBULATORY_CARE_PROVIDER_SITE_OTHER): Payer: Medicare Other | Admitting: Family Medicine

## 2019-09-05 ENCOUNTER — Encounter: Payer: Self-pay | Admitting: Family Medicine

## 2019-09-05 VITALS — BP 150/80 | HR 100 | Ht 62.0 in | Wt 95.6 lb

## 2019-09-05 DIAGNOSIS — E039 Hypothyroidism, unspecified: Secondary | ICD-10-CM

## 2019-09-05 DIAGNOSIS — E1169 Type 2 diabetes mellitus with other specified complication: Secondary | ICD-10-CM

## 2019-09-05 DIAGNOSIS — E1159 Type 2 diabetes mellitus with other circulatory complications: Secondary | ICD-10-CM

## 2019-09-05 DIAGNOSIS — R636 Underweight: Secondary | ICD-10-CM

## 2019-09-05 DIAGNOSIS — R634 Abnormal weight loss: Secondary | ICD-10-CM | POA: Diagnosis not present

## 2019-09-05 DIAGNOSIS — I10 Essential (primary) hypertension: Secondary | ICD-10-CM

## 2019-09-05 DIAGNOSIS — E441 Mild protein-calorie malnutrition: Secondary | ICD-10-CM | POA: Diagnosis not present

## 2019-09-05 DIAGNOSIS — E7800 Pure hypercholesterolemia, unspecified: Secondary | ICD-10-CM

## 2019-09-05 LAB — POCT GLYCOSYLATED HEMOGLOBIN (HGB A1C): Hemoglobin A1C: 5.9 % — AB (ref 4.0–5.6)

## 2019-09-05 MED ORDER — LISINOPRIL 20 MG PO TABS: ORAL_TABLET | ORAL | 0 refills | Status: DC

## 2019-09-05 MED ORDER — METFORMIN HCL 1000 MG PO TABS: 1000.0000 mg | ORAL_TABLET | Freq: Two times a day (BID) | ORAL | 1 refills | Status: DC

## 2019-09-05 MED ORDER — ATORVASTATIN CALCIUM 80 MG PO TABS: ORAL_TABLET | ORAL | 1 refills | Status: DC

## 2019-09-05 MED ORDER — NIACIN ER (ANTIHYPERLIPIDEMIC) 1000 MG PO TBCR: 1000.0000 mg | EXTENDED_RELEASE_TABLET | Freq: Every day | ORAL | 1 refills | Status: DC

## 2019-09-05 NOTE — Progress Notes (Signed)
Chief Complaint  Patient presents with  . Diabetes    nonfasting med check. Patient is concerned about her weight loss. She feels like the bones in her chest are sticking out. Her family has mentioned this to her as well.     Patient presents for 6 month med check.  Weight loss--noted by family.  She thinks sometimes she doesn't eat enough. She wanted to try protein bars, but she looked at some, but they had dairy in it. She reports she cannot tolerate milk products or dairy, yet she reports she can tolerate eating cheese. She is very particular with other foods she can/cannot eat, as they bother her stomach. She likes peanut butter, has some on crackers.  Hypertension.She continues on 20mg  oflisinopril,tolerating this without side effects. BP's have been running 115-150/80. 130/72 last night, P 85.  Sees it higher if she eats more salt. She continues to limit fast food, and buys no added salt canned foods. She denies dizziness, chest pain, cough, edema, headaches.  Hypothyroidism:Her thyroid dose was decreased from 88 to 75 mcg in 02/2019, due to low TSH.  Recheck was normal. She reports compliance with taking 32mcg levothyroxine daily, on empty stomach, separate from food and other medications. While she has noted weight loss, she denies changes in energy, hair/skin/nails/bowels. She does report occasional feeling more depressed/withdrawn. She is due for recheck of TSH today Lab Results  Component Value Date   TSH 1.240 04/16/2019   Diabetes follow-up: She is compliant with metformin and denies side effects. Blood sugars at home are running around 100 in the mornings, not checking other times of the day. Denies hypoglycemia,polydipsia and polyuria (except after drinking coffee). Eye exams aredoneyearly, last 07/2019, no retinopathy. Patient follows a low sugar diet and checks feet regularly without concerns.Denies numbness, tingling or burningpain in the feet.  H/o  microalbuminuria, noted when she hadUTI;rechecks since have been normal. Due for recheck today. Lab Results  Component Value Date   HGBA1C 5.7 (A) 03/04/2019   Hyperlipidemia follow-up: Patient is reportedly following a low-fat, low cholesterol diet. Compliant with medications(atorvastatin, niaspan and fish oil)and denies medication side effects. No flushing or myalgias.Lipids were atgoal on last checkon this regimen. Lab Results  Component Value Date   CHOL 130 03/04/2019   HDL 64 03/04/2019   LDLCALC 56 03/04/2019   TRIG 38 03/04/2019   CHOLHDL 2.0 03/04/2019    PMH, PSH, SH reviewed  Outpatient Encounter Medications as of 09/05/2019  Medication Sig  . aspirin 81 MG tablet Take 81 mg by mouth daily.    Marland Kitchen atorvastatin (LIPITOR) 80 MG tablet TAKE 1 TABLET(80 MG) BY MOUTH DAILY  . Calcium Carbonate-Vitamin D (CALCIUM 600 + D PO) Take 1 tablet by mouth 2 (two) times daily.    . fish oil-omega-3 fatty acids 1000 MG capsule Take 1 g by mouth daily.   Marland Kitchen levothyroxine (SYNTHROID) 75 MCG tablet Take 1 tablet (75 mcg total) by mouth daily.  Marland Kitchen lisinopril (ZESTRIL) 20 MG tablet TAKE 1 TABLET(20 MG) BY MOUTH DAILY  . metFORMIN (GLUCOPHAGE) 1000 MG tablet Take 1 tablet (1,000 mg total) by mouth 2 (two) times daily with a meal.  . niacin (NIASPAN) 1000 MG CR tablet Take 1 tablet (1,000 mg total) by mouth at bedtime.  . [DISCONTINUED] atorvastatin (LIPITOR) 80 MG tablet TAKE 1 TABLET(80 MG) BY MOUTH DAILY  . [DISCONTINUED] lisinopril (ZESTRIL) 20 MG tablet TAKE 1 TABLET(20 MG) BY MOUTH DAILY  . [DISCONTINUED] metFORMIN (GLUCOPHAGE) 1000 MG tablet Take 1  tablet (1,000 mg total) by mouth 2 (two) times daily with a meal.  . [DISCONTINUED] niacin (NIASPAN) 1000 MG CR tablet Take 1 tablet (1,000 mg total) by mouth at bedtime.   No facility-administered encounter medications on file as of 09/05/2019.   No Known Allergies   ROS: no fever, chills, URI symptoms, headaches, dizziness, chest  pain, shortness of breath, edema. She denies any GI or GU complaints. No bleeding, bruising, rashes.  No changes to energy, hair/skin. Sometimes stressed, "get in my own shell" Weight loss per HPI   PHYSICAL EXAM:  BP (!) 150/90   Pulse 100   Ht 5\' 2"  (1.575 m)   Wt 95 lb 9.6 oz (43.4 kg)   BMI 17.49 kg/m   Recheck by MD 150/80  Wt Readings from Last 3 Encounters:  09/05/19 95 lb 9.6 oz (43.4 kg)  03/04/19 103 lb 12.8 oz (47.1 kg)  09/05/18 114 lb (51.7 kg)   Well developed, very thin female in no distress HEENT: PERRL ,EOMI, conjunctiva and sclera are clear, wearing mask Neck: no lymphadenopathy, thyromegaly or mass, no carotid bruit Heart: regular rate and rhythm without murmur (not tachycardic at time of cardiac exam) Lungs: clear bilaterally  Back: no CVA or spinal tenderness  Abdomen: soft, nontender, no organomegaly or mass  Extremities: no edema, 2+ pulse.  Arthritic changes noted in her fingers bilaterally Skin: no rash, normal sensation Psych: normal mood, affect, hygiene and grooming.  Neuro: alert and oriented, normal gait.  Lab Results  Component Value Date   HGBA1C 5.9 (A) 09/05/2019    ASSESSMENT/PLAN:  Hypothyroidism, unspecified type - Plan: TSH  Controlled type 2 diabetes mellitus with other specified complication, without long-term current use of insulin ([place]) - Plan: HgB A1c, Microalbumin / creatinine urine ratio, metFORMIN (GLUCOPHAGE) 1000 MG tablet  Hypertension associated with diabetes ([place])  Weight loss - Ddx reviewed--await TSH results to know if med needs to be adjusted. If adequate intake (doubt), consider further w/u, ie cancer. Refuses dietician, educated - Plan: TSH  Underweight - counseled re: risks, need for proper intake, and how nutritionist could help. She refuses   Mild protein-calorie malnutrition ([place]) - Plan: TSH  Pure hypercholesterolemia - continue statin; prev at goal, due for labs today - Plan: atorvastatin (LIPITOR)  80 MG tablet, niacin (NIASPAN) 1000 MG CR tablet  Essential hypertension, benign - Elevated today. Reviewed low sodium diet, exercise, and regular monitoring. Given log, to send in 3-4 weeks - Plan: lisinopril (ZESTRIL) 20 MG tablet   A1c, TSH, urine microalb  F/u as scheduled in December Likely will need sooner f/u to recheck weight, and/or if thyroid med changed. Will schedule that f/u after labs back.  Spent significant time counseling pt on diet, adequate intake, how nutritionist could be helpful (she adamantly refused referral). Discussed Ddx including depression as causing decreased appetite and weight loss.  May need to investigate further if she isn't gaining weight with working harder on intake and verifying thyroid dose.  FTF time 35-40 min, plus additional time in chart review and documentation.   Monitor your blood pressure (as you have been), but also record it on the sheet provided. Drop off the list of your blood pressures for my review in 3-4 weeks.  If your blood pressure is consistently >135-140/85-90 then your lisinopril dose should be increased. Try and limit the sodium in your diet.  Occasionally check sugars other times of the day (2 hours after eating, or at bedtime), rather than only checking in  the morning.

## 2019-09-05 NOTE — Patient Instructions (Addendum)
Monitor your blood pressure (as you have been), but also record it on the sheet provided. Drop off the list of your blood pressures for my review in 3-4 weeks.  If your blood pressure is consistently >135-140/85-90 then your lisinopril dose should be increased. Try and limit the sodium in your diet.  Occasionally check sugars other times of the day (2 hours after eating, or at bedtime), rather than only checking in the morning.   DASH Eating Plan DASH stands for "Dietary Approaches to Stop Hypertension." The DASH eating plan is a healthy eating plan that has been shown to reduce high blood pressure (hypertension). It may also reduce your risk for type 2 diabetes, heart disease, and stroke. The DASH eating plan may also help with weight loss. What are tips for following this plan?  General guidelines  Avoid eating more than 2,300 mg (milligrams) of salt (sodium) a day. If you have hypertension, you may need to reduce your sodium intake to 1,500 mg a day.  Limit alcohol intake to no more than 1 drink a day for nonpregnant women and 2 drinks a day for men. One drink equals 12 oz of beer, 5 oz of wine, or 1 oz of hard liquor.  Work with your health care provider to maintain a healthy body weight or to lose weight. Ask what an ideal weight is for you.  Get at least 30 minutes of exercise that causes your heart to beat faster (aerobic exercise) most days of the week. Activities may include walking, swimming, or biking.  Work with your health care provider or diet and nutrition specialist (dietitian) to adjust your eating plan to your individual calorie needs. Reading food labels   Check food labels for the amount of sodium per serving. Choose foods with less than 5 percent of the Daily Value of sodium. Generally, foods with less than 300 mg of sodium per serving fit into this eating plan.  To find whole grains, look for the word "whole" as the first word in the ingredient  list. Shopping  Buy products labeled as "low-sodium" or "no salt added."  Buy fresh foods. Avoid canned foods and premade or frozen meals. Cooking  Avoid adding salt when cooking. Use salt-free seasonings or herbs instead of table salt or sea salt. Check with your health care provider or pharmacist before using salt substitutes.  Do not fry foods. Cook foods using healthy methods such as baking, boiling, grilling, and broiling instead.  Cook with heart-healthy oils, such as olive, canola, soybean, or sunflower oil. Meal planning  Eat a balanced diet that includes: ? 5 or more servings of fruits and vegetables each day. At each meal, try to fill half of your plate with fruits and vegetables. ? Up to 6-8 servings of whole grains each day. ? Less than 6 oz of lean meat, poultry, or fish each day. A 3-oz serving of meat is about the same size as a deck of cards. One egg equals 1 oz. ? 2 servings of low-fat dairy each day. ? A serving of nuts, seeds, or beans 5 times each week. ? Heart-healthy fats. Healthy fats called Omega-3 fatty acids are found in foods such as flaxseeds and coldwater fish, like sardines, salmon, and mackerel.  Limit how much you eat of the following: ? Canned or prepackaged foods. ? Food that is high in trans fat, such as fried foods. ? Food that is high in saturated fat, such as fatty meat. ? Sweets, desserts, sugary drinks,  and other foods with added sugar. ? Full-fat dairy products.  Do not salt foods before eating.  Try to eat at least 2 vegetarian meals each week.  Eat more home-cooked food and less restaurant, buffet, and fast food.  When eating at a restaurant, ask that your food be prepared with less salt or no salt, if possible. What foods are recommended? The items listed may not be a complete list. Talk with your dietitian about what dietary choices are best for you. Grains Whole-grain or whole-wheat bread. Whole-grain or whole-wheat pasta. Brown  rice. [place]. Bulgur. Whole-grain and low-sodium cereals. Pita bread. Low-fat, low-sodium crackers. Whole-wheat flour tortillas. Vegetables Fresh or frozen vegetables (raw, steamed, roasted, or grilled). Low-sodium or reduced-sodium tomato and vegetable juice. Low-sodium or reduced-sodium tomato sauce and tomato paste. Low-sodium or reduced-sodium canned vegetables. Fruits All fresh, dried, or frozen fruit. Canned fruit in natural juice (without added sugar). Meat and other protein foods Skinless chicken or [place]. Ground chicken or [place]. Pork with fat trimmed off. Fish and seafood. Egg whites. Dried beans, peas, or lentils. Unsalted nuts, nut butters, and seeds. Unsalted canned beans. Lean cuts of beef with fat trimmed off. Low-sodium, lean deli meat. Dairy Low-fat (1%) or fat-free (skim) milk. Fat-free, low-fat, or reduced-fat cheeses. Nonfat, low-sodium ricotta or cottage cheese. Low-fat or nonfat yogurt. Low-fat, low-sodium cheese. Fats and oils Soft margarine without trans fats. Vegetable oil. Low-fat, reduced-fat, or light mayonnaise and salad dressings (reduced-sodium). Canola, safflower, olive, soybean, and sunflower oils. Avocado. Seasoning and other foods Herbs. Spices. Seasoning mixes without salt. Unsalted popcorn and pretzels. Fat-free sweets. What foods are not recommended? The items listed may not be a complete list. Talk with your dietitian about what dietary choices are best for you. Grains Baked goods made with fat, such as croissants, muffins, or some breads. Dry pasta or rice meal packs. Vegetables Creamed or fried vegetables. Vegetables in a cheese sauce. Regular canned vegetables (not low-sodium or reduced-sodium). Regular canned tomato sauce and paste (not low-sodium or reduced-sodium). Regular tomato and vegetable juice (not low-sodium or reduced-sodium). Angie Fava. Olives. Fruits Canned fruit in a light or heavy syrup. Fried fruit. Fruit in cream or butter  sauce. Meat and other protein foods Fatty cuts of meat. Ribs. Fried meat. Berniece Salines. Sausage. Bologna and other processed lunch meats. Salami. Fatback. Hotdogs. Bratwurst. Salted nuts and seeds. Canned beans with added salt. Canned or smoked fish. Whole eggs or egg yolks. Chicken or [place] with skin. Dairy Whole or 2% milk, cream, and half-and-half. Whole or full-fat cream cheese. Whole-fat or sweetened yogurt. Full-fat cheese. Nondairy creamers. Whipped toppings. Processed cheese and cheese spreads. Fats and oils Butter. Stick margarine. Lard. Shortening. Ghee. Bacon fat. Tropical oils, such as coconut, palm kernel, or palm oil. Seasoning and other foods Salted popcorn and pretzels. Onion salt, garlic salt, seasoned salt, table salt, and sea salt. Worcestershire sauce. Tartar sauce. Barbecue sauce. Teriyaki sauce. Soy sauce, including reduced-sodium. Steak sauce. Canned and packaged gravies. Fish sauce. Oyster sauce. Cocktail sauce. Horseradish that you find on the shelf. Ketchup. Mustard. Meat flavorings and tenderizers. Bouillon cubes. Hot sauce and Tabasco sauce. Premade or packaged marinades. Premade or packaged taco seasonings. Relishes. Regular salad dressings. Where to find more information:  National Heart, Lung, and [place]: https://wilson-eaton.com/  American Heart Association: www.heart.org Summary  The DASH eating plan is a healthy eating plan that has been shown to reduce high blood pressure (hypertension). It may also reduce your risk for type 2 diabetes, heart disease, and stroke.  With the DASH eating plan, you should limit salt (sodium) intake to 2,300 mg a day. If you have hypertension, you may need to reduce your sodium intake to 1,500 mg a day.  When on the DASH eating plan, aim to eat more fresh fruits and vegetables, whole grains, lean proteins, low-fat dairy, and heart-healthy fats.  Work with your health care provider or diet and nutrition specialist (dietitian) to adjust  your eating plan to your individual calorie needs. This information is not intended to replace advice given to you by your health care provider. Make sure you discuss any questions you have with your health care provider. Document Revised: 03/10/2017 Document Reviewed: 03/21/2016 Elsevier Patient Education  2020 [employer].

## 2019-09-06 LAB — MICROALBUMIN / CREATININE URINE RATIO
Creatinine, Urine: 37.6 mg/dL
Microalb/Creat Ratio: <8 mg/g creat (ref 0–29)
Microalbumin, Urine: <3 ug/mL

## 2019-09-06 LAB — TSH: TSH: 1.76 u[IU]/mL (ref 0.450–4.500)

## 2019-10-20 ENCOUNTER — Other Ambulatory Visit: Payer: Self-pay | Admitting: Family Medicine

## 2019-10-20 DIAGNOSIS — E039 Hypothyroidism, unspecified: Secondary | ICD-10-CM

## 2019-10-21 ENCOUNTER — Other Ambulatory Visit: Payer: Self-pay | Admitting: Family Medicine

## 2019-10-21 DIAGNOSIS — E039 Hypothyroidism, unspecified: Secondary | ICD-10-CM

## 2019-10-21 NOTE — Progress Notes (Signed)
Chief Complaint  Patient presents with   other    follow up on bp/ weight    Patient presents for 1 month follow-up. This was recommended to follow up on her weight and blood pressure.  Hypertension:  She continues on lisinopril 20mg  daily. BP had been elevated at her last visit. She was encouraged to monitor regularly at home, keep a log, and bring to the visit. See results below. BP Readings from Last 3 Encounters:  09/06/19 (!) 150/80  03/04/19 124/80  09/05/18 117/77   She continues to limit fast food, and buys no added salt canned foods. She denies dizziness, chest pain, cough, edema, headaches. She felt "her nerves kick in" as soon as she pulled into our parking lot today. BP log reviewed--BP's 117-131/71-82 in the mornings. 119-135/68-82 in the evening.  BP's were a little higher later in the day (more 125-135, mornings mostly 706-237 systolic).  F/u weight: She had lost a significant amount of weight in the last year (19#).  She has hypothyroidism, and TSH was normal on last recheck.  She was encouraged to see nutritionist to help with getting her nutritional requirements and help with maintaining/gaining weight, but she declined at last visit. She thought sometimes she doesn't eat enough. She had wanted to try protein bars, the ones she saw had dairy in it and she cannot tolerate milk products or dairy (yet she reported tolerating eating cheese). She is very particular with other foods she can/cannot eat, as they bother her stomach, yet didn't want to meet with nutritionist to help guide/assist her in achieving nutritional needs despite these difficulties. She likes peanut butter, has some on crackers.  Today she brings in lists of what she has been eating.   Typical breakfasts include liverpudding, with an egg or grits, or bowl of oatmeal, or bacon, eggs, grits, sometimes a biscuit rather than gritz.  Has also had "lunchmeat like sandwich" and grilled cheese. Lunches include PBJ  sandiwches, [place], tomato, lettuce and fruit cup, other sandwiche (whicken with cheese, chicken salad, bologna and cheese), hot dog and bake beans. Dinners:  Spaghetti and garlic bread, potroast and vegetable, steak and mashed potatoes, chicken bake and beans, boiled chicken, stuffing with brocolli, hamburger and fries, [place] neck with stuffing and vegetable. Once listed soup and crackers for dinner.  She has gained some weight since her last visit.  She noted that she eats a lot less than what her husband and daughter eat, eats 1/3-1/2 of the portions they eat.  She realized this, and has been trying to eat larger portions. She used to get hungry again at 2 am. Since eating larger portions at dinner, she is less hungry in the middle of the night.  Hot dog, bologna, lunchmeat and bacon are the salt-containing foods she has eaten (not a lot) per diet above. Wt Readings from Last 3 Encounters:  09/05/19 95 lb 9.6 oz (43.4 kg)  03/04/19 103 lb 12.8 oz (47.1 kg)  09/05/18 114 lb (51.7 kg)   Lab Results  Component Value Date   TSH 1.760 09/05/2019   She is a diabetic, well controlled per last check, and compliant with her medication. Lab Results  Component Value Date   HGBA1C 5.9 (A) 09/05/2019  Her sugar this morning was 98.   PMH, PSH, SH reviewed  Outpatient Encounter Medications as of 10/23/2019  Medication Sig   aspirin 81 MG tablet Take 81 mg by mouth daily.     atorvastatin (LIPITOR) 80 MG tablet TAKE  1 TABLET(80 MG) BY MOUTH DAILY   Calcium Carbonate-Vitamin D (CALCIUM 600 + D PO) Take 1 tablet by mouth 2 (two) times daily.     fish oil-omega-3 fatty acids 1000 MG capsule Take 1 g by mouth daily.    levothyroxine (SYNTHROID) 75 MCG tablet TAKE 1 TABLET(75 MCG) BY MOUTH DAILY   lisinopril (ZESTRIL) 20 MG tablet TAKE 1 TABLET(20 MG) BY MOUTH DAILY   metFORMIN (GLUCOPHAGE) 1000 MG tablet Take 1 tablet (1,000 mg total) by mouth 2 (two) times daily with a meal.   niacin  (NIASPAN) 1000 MG CR tablet Take 1 tablet (1,000 mg total) by mouth at bedtime.   No facility-administered encounter medications on file as of 10/23/2019.   No Known Allergies  ROS: no headaches, dizziness, URI symptoms, chest pain, shortness of breath, GI complaints, urinary complaints, edema, bleeding, bruising or other concerns.  Moods are good.  She will be going back to work at night part-time, and is looking forward to this.   PHYSICAL EXAM:  BP (!) 148/72    Pulse 95    Temp 98.1 F (36.7 C)    Wt 99 lb 9.6 oz (45.2 kg) Comment: with shoes   SpO2 98%    BMI 18.22 kg/m   138/74 on repeat by MD  Wt Readings from Last 3 Encounters:  10/23/19 99 lb 9.6 oz (45.2 kg)  09/05/19 95 lb 9.6 oz (43.4 kg)  03/04/19 103 lb 12.8 oz (47.1 kg)   Well-appearing, thin female, slightly nervous, in no distress HEENT: conjunctiva and sclera are clear, EOMI, wearing mask Neck: no lymphadenopathy, thyromegaly or bruit Heart: regular rate and rhythm Lungs: clear bilaterally Back: no spinal or CVA tenderness Abdomen: soft, nontender, no mass Extremities: no edema Neuro: alert and oriented, normal gait Psych: normal mood, slightly anxious, full range of affect. Normal hygiene and grooming   ASSESSMENT/PLAN:  Essential hypertension, benign - BP is well controlled at home, somewhat higher here.  Has white coat component. Continue current regimen.  Reviewed low sodium diet  Underweight - This has improved since increasing portions.  Encouraged her to eat snacks between meals. Prev would forget to eat when a work--reminded to plan ahead  Mild protein-calorie malnutrition ([place])  Controlled type 2 diabetes mellitus with other specified complication, without long-term current use of insulin ([place]) - remains well controlled  Hypothyroidism, unspecified type - adequately replaced on current dose per last check, continue - Plan: levothyroxine (SYNTHROID) 75 MCG tablet  F/u in December as  scheduled   Your blood pressure is well controlled at home. There is a white-coat component which makes it higher at the doctor. Continue to periodically monitor it at home to make sure that it remains at goal (under 130-135/80-85).  Let us know if they become consistently higher. Continue to limit the salt.  Out of the foods that you wrote down, keep portions small and frequency down of eating bologna, hot dogs, lunch meats, bacon (which are high in sodium, look at the labels).  Try and have some snacks between meal, or a snack at bedtime (apple with peanut butter, or toast with peanut butter or unsalted crackers). Increasing your portions at meals has helped you regain some weight--keep it up, and add snacks.

## 2019-10-23 ENCOUNTER — Encounter: Payer: Self-pay | Admitting: Family Medicine

## 2019-10-23 ENCOUNTER — Ambulatory Visit (INDEPENDENT_AMBULATORY_CARE_PROVIDER_SITE_OTHER): Payer: Medicare Other | Admitting: Family Medicine

## 2019-10-23 ENCOUNTER — Other Ambulatory Visit: Payer: Self-pay

## 2019-10-23 VITALS — BP 138/74 | HR 95 | Temp 98.1°F | Wt 99.6 lb

## 2019-10-23 DIAGNOSIS — E441 Mild protein-calorie malnutrition: Secondary | ICD-10-CM

## 2019-10-23 DIAGNOSIS — R636 Underweight: Secondary | ICD-10-CM

## 2019-10-23 DIAGNOSIS — E039 Hypothyroidism, unspecified: Secondary | ICD-10-CM | POA: Diagnosis not present

## 2019-10-23 DIAGNOSIS — I10 Essential (primary) hypertension: Secondary | ICD-10-CM | POA: Diagnosis not present

## 2019-10-23 DIAGNOSIS — E1169 Type 2 diabetes mellitus with other specified complication: Secondary | ICD-10-CM | POA: Diagnosis not present

## 2019-10-23 MED ORDER — LEVOTHYROXINE SODIUM 75 MCG PO TABS: ORAL_TABLET | ORAL | 1 refills | Status: DC

## 2019-10-23 NOTE — Patient Instructions (Addendum)
Your blood pressure is well controlled at home. There is a white-coat component which makes it higher at the doctor. Continue to periodically monitor it at home to make sure that it remains at goal (under 130-135/80-85).  Let us know if they become consistently higher. Continue to limit the salt.  Out of the foods that you wrote down, keep portions small and frequency down of eating bologna, hot dogs, lunch meats, bacon (which are high in sodium, look at the labels).  Try and have some snacks between meal, or a snack at bedtime (apple with peanut butter, or toast with peanut butter or unsalted crackers). Increasing your portions at meals has helped you regain some weight--keep it up, and add snacks.

## 2019-11-21 ENCOUNTER — Other Ambulatory Visit: Payer: Self-pay | Admitting: Family Medicine

## 2019-11-21 DIAGNOSIS — E039 Hypothyroidism, unspecified: Secondary | ICD-10-CM

## 2019-12-03 ENCOUNTER — Other Ambulatory Visit: Payer: Self-pay | Admitting: Family Medicine

## 2019-12-03 DIAGNOSIS — I10 Essential (primary) hypertension: Secondary | ICD-10-CM

## 2020-02-08 DIAGNOSIS — Z1231 Encounter for screening mammogram for malignant neoplasm of breast: Secondary | ICD-10-CM | POA: Diagnosis not present

## 2020-02-08 LAB — HM MAMMOGRAPHY

## 2020-02-21 ENCOUNTER — Other Ambulatory Visit: Payer: Self-pay | Admitting: Family Medicine

## 2020-02-21 DIAGNOSIS — I10 Essential (primary) hypertension: Secondary | ICD-10-CM

## 2020-02-21 NOTE — Telephone Encounter (Signed)
Has upcoming appt in december

## 2020-02-25 ENCOUNTER — Encounter: Payer: Self-pay | Admitting: Family Medicine

## 2020-03-02 ENCOUNTER — Other Ambulatory Visit: Payer: Self-pay | Admitting: Family Medicine

## 2020-03-02 DIAGNOSIS — E1169 Type 2 diabetes mellitus with other specified complication: Secondary | ICD-10-CM

## 2020-03-02 DIAGNOSIS — E7800 Pure hypercholesterolemia, unspecified: Secondary | ICD-10-CM

## 2020-03-09 ENCOUNTER — Ambulatory Visit (INDEPENDENT_AMBULATORY_CARE_PROVIDER_SITE_OTHER): Payer: Medicare Other | Admitting: Family Medicine

## 2020-03-09 ENCOUNTER — Encounter: Payer: Self-pay | Admitting: Family Medicine

## 2020-03-09 ENCOUNTER — Other Ambulatory Visit: Payer: Self-pay

## 2020-03-09 VITALS — BP 140/88 | HR 92 | Temp 97.8°F | Ht 62.0 in | Wt 97.4 lb

## 2020-03-09 DIAGNOSIS — Z23 Encounter for immunization: Secondary | ICD-10-CM

## 2020-03-09 DIAGNOSIS — J302 Other seasonal allergic rhinitis: Secondary | ICD-10-CM | POA: Diagnosis not present

## 2020-03-09 DIAGNOSIS — H6122 Impacted cerumen, left ear: Secondary | ICD-10-CM | POA: Diagnosis not present

## 2020-03-09 DIAGNOSIS — H9312 Tinnitus, left ear: Secondary | ICD-10-CM | POA: Diagnosis not present

## 2020-03-09 NOTE — Progress Notes (Signed)
Chief Complaint  Patient presents with  . Tinnitus    left ear has ringing in it-started last week. No pain, just ringing.    Patient presents with complaint of ringing in the left ear which started last week.   Denies any ear pain, trauma or hearing loss. She is having some left-sided sinus issues. She is having some runny nose, congestion, postnasal drainage.  Nasal drainage is clear. She took some Tylenol Sinus, which helped with some symptoms, not the ear ringing.  She hasn't taken any allergy medications recently (felt like the sinus med worked better).  PMH, PSH, SH reviewed  Outpatient Encounter Medications as of 03/09/2020  Medication Sig  . aspirin 81 MG tablet Take 81 mg by mouth daily.    Marland Kitchen atorvastatin (LIPITOR) 80 MG tablet TAKE 1 TABLET(80 MG) BY MOUTH DAILY  . Calcium Carbonate-Vitamin D (CALCIUM 600 + D PO) Take 1 tablet by mouth 2 (two) times daily.    . fish oil-omega-3 fatty acids 1000 MG capsule Take 1 g by mouth daily.   Marland Kitchen levothyroxine (SYNTHROID) 75 MCG tablet TAKE 1 TABLET(75 MCG) BY MOUTH DAILY  . lisinopril (ZESTRIL) 20 MG tablet TAKE 1 TABLET(20 MG) BY MOUTH DAILY  . metFORMIN (GLUCOPHAGE) 1000 MG tablet TAKE 1 TABLET(1000 MG) BY MOUTH TWICE DAILY WITH A MEAL  . niacin (NIASPAN) 1000 MG CR tablet TAKE 1 TABLET(1000 MG) BY MOUTH AT BEDTIME   No facility-administered encounter medications on file as of 03/09/2020.   No Known Allergies   ROS:  No fever, chills.  +allergies per HPI.  No ear pain, shortness of breath, chest pain, GI complaints.  No ear pain or hearing loss.  +tinnitus on left per HPI   PHYSICAL EXAM:  BP 140/88   Pulse 92   Temp 97.8 F (36.6 C) (Tympanic)   Ht 5\' 2"  (1.575 m)   Wt 97 lb 6.4 oz (44.2 kg)   BMI 17.81 kg/m   Well-appearing, thin female in no distress HEENT: conjunctiva and sclera are clear, EOMI.  L TM is obscured by cerumen.  After lavage, TM is visualized and normal.  R TM and EAC is normal. Nasal mucosa is  mild-mod edematous, no mucus Sinuses nontender Neck: no lymphadenopathy or mass Heart: regular rate and rhythm Lungs: clear bilaterally   ASSESSMENT/PLAN:  Tinnitus of left ear - resolved with removal of cerumen  Impacted cerumen of left ear - treated successfully with ear lavage, with normal exam and resolution of tinnitus upon removal  Need for influenza vaccination - Plan: Flu Vaccine QUAD High Dose(Fluad)  Seasonal allergies - advised to resume antihistamines, and to avoid decongestants due to HTN

## 2020-03-09 NOTE — Patient Instructions (Signed)
I recommend that you restart medications for allergies (claritin or allegra or zyrtec).  Do not get the "D" versions, which will raise blood pressure. Do not use Tylenol Cold, which has a decongestant in it. You can also use a nasal steroid spray such as Flonase or Rhinocort or Nasacort--this might work even better, but takes a little longer for you to see the full effect.

## 2020-03-20 NOTE — Progress Notes (Signed)
Chief Complaint  Patient presents with  . cpe    Fasting awv/cpe, no concerns, if she doesn't have to pap she doesn't want it    Sierra Combs is a 69 y.o. female who presents for annual physical, Medicare Wellness Visit, and follow-up on chronic medical conditions.    She is having some left ankle pain and swelling, plans to follow up with the podiatrist.   Hypertension.She continues on 20mg  oflisinopril,tolerating this without side effects. BP's have been running"good", systolic 161-096'E, only occasionally higher, up to 135 if eating more salt, with diastolic in 45-40'J. She continues to limit fast food, and buys no added salt canned foods. She denies dizziness, chest pain, palpitations, cough, edema, headaches.  Hypothyroidism:Her thyroid dose was decreased from 88 to 75 mcg in 02/2019, due to low TSH.  Recheck was normal. She reports compliance with taking 64mcg levothyroxine daily, on empty stomach, separate from food and other medications. She denies changes in energy, hair/skin/nails/bowels. Weight has been stable/improved. She had reported occasionally feeling more depressed/withdrawn after she retired, but feeling better since she went back to work part-time. Lab Results  Component Value Date   TSH 1.760 09/05/2019   Diabetes follow-up: She is compliant with metformin and denies side effects. Blood sugars at home are running80's-102 in the mornings, not checking other times of the day. Denies hypoglycemia,polydipsia and polyuria (except after drinking coffee). Eye exams aredoneyearly, last 07/2019, no retinopathy. Patient follows a low sugar diet and checks feet regularly without concerns.Denies numbness, tingling or burningpain in the feet.  H/o microalbuminuria, noted when she hadUTI;rechecks since have been normal.  Lab Results  Component Value Date   HGBA1C 5.9 (A) 09/05/2019   Hyperlipidemia follow-up: Patient is reportedly following a low-fat, low  cholesterol diet. Compliant with medications(atorvastatin, niaspan and fish oil)and denies medication side effects. No flushing or myalgias.Lipids were atgoal on last checkon this regimen.Due for recheck today. Lab Results  Component Value Date   CHOL 130 03/04/2019   HDL 64 03/04/2019   LDLCALC 56 03/04/2019   TRIG 38 03/04/2019   CHOLHDL 2.0 03/04/2019   Osteopenia: She had DEXA in 05/2012, which showed T-1.3. She had been on Fosamax for many years (>5), so it was stopped after that DEXA.  She last had DEXA 01/2019, stable at hips, T-1.7 at R fem neck.Vitamin D level was normal at 55 in 05/2012. She reports she already scheduled appointment for next October (discussed today--can defer for 3 year f/u)   Immunization History  Administered Date(s) Administered  . Fluad Quad(high Dose 65+) 03/04/2019, 03/09/2020  . Influenza Split 02/10/2012  . Influenza, High Dose Seasonal PF 02/15/2017, 02/02/2018  . Influenza-Unspecified 01/13/2015, 02/01/2016  . PFIZER SARS-COV-2 Vaccination 06/07/2019, 07/02/2019, 02/08/2020  . Pneumococcal Conjugate-13 02/25/2016  . Pneumococcal Polysaccharide-23 05/04/2007, 03/01/2017  . Td 06/17/1997  . Tdap 05/04/2007, 12/17/2012  . Zoster 12/30/2013   Last Pap smear:01/2015; no high risk HPV detected Last mammogram:01/2020 Last colonoscopy: 03/2019, diverticulosis and tubular adenoma.  5 year f/u recommended Last DEXA:01/2019, T-1.7 R fem neck Dentist: up to date (was told she has gum disease and needs deep cleanings) Ophtho: yearly  Exercise: Does some seated cardio and arm exercises with weights.  Not much walking (other than parking far in the parking lot, and around her apartment).   Other doctors caring for patient: Ophtho: Dr. Kathlen Mody GI: Dr. Penelope Coop Ortho: can't recall who she saw, plans to see another in future, when needed Dentist:Harvey Smiles  Podiatrist: Dr. Paulla Dolly   She  does not having a Living Will or Healthcare power of  attorney.Given paperwork again last year. She can't recall if she still has it, given again.  Fall screen negative Depression screen negative Functional status  Unremarkable Mini-Cog screen: 4/5 (missed 1 word on recall) See full questionnaires in Epic   PMH, PSH, [place] and FH were reviewed and updated  Outpatient Encounter Medications as of 03/23/2020  Medication Sig  . aspirin 81 MG tablet Take 81 mg by mouth daily.  . Calcium Carbonate-Vitamin D (CALCIUM 600 + D PO) Take 1 tablet by mouth 2 (two) times daily.  . fish oil-omega-3 fatty acids 1000 MG capsule Take 1 g by mouth daily.  Marland Kitchen lisinopril (ZESTRIL) 20 MG tablet TAKE 1 TABLET(20 MG) BY MOUTH DAILY  . metFORMIN (GLUCOPHAGE) 1000 MG tablet TAKE 1 TABLET(1000 MG) BY MOUTH TWICE DAILY WITH A MEAL  . niacin (NIASPAN) 1000 MG CR tablet TAKE 1 TABLET(1000 MG) BY MOUTH AT BEDTIME  . [DISCONTINUED] atorvastatin (LIPITOR) 80 MG tablet TAKE 1 TABLET(80 MG) BY MOUTH DAILY  . [DISCONTINUED] levothyroxine (SYNTHROID) 75 MCG tablet TAKE 1 TABLET(75 MCG) BY MOUTH DAILY  . atorvastatin (LIPITOR) 80 MG tablet TAKE 1 TABLET(80 MG) BY MOUTH DAILY  . levothyroxine (SYNTHROID) 75 MCG tablet Take 1 tablet (75 mcg total) by mouth daily before breakfast.   No facility-administered encounter medications on file as of 03/23/2020.   No Known Allergies  ROS: The patient denies anorexia, fever, headaches, vision changes, decreased hearing, ear pain, sore throat, breast concerns, chest pain, palpitations, dizziness, syncope, dyspnea on exertion, cough, swelling, nausea, vomiting, diarrhea, constipation, abdominal pain, melena, hematochezia, indigestion/heartburn, hematuria,incontinence, dysuria, vaginal bleeding, discharge, odor or itch, genital lesions, numbness, tingling, weakness, tremor, suspicious skin lesions, depression, anxiety, abnormal bleeding/bruising or enlarged lymph nodes.  Some vaginal dryness (very mild). Mild hot flashes, improved. Right  kneearthritis, intermittentpain, not bothering her much recently (only if a lot of stairs or walking) L ankle pain recently, since restarted work   PHYSICAL EXAM:  BP 120/70   Pulse 84   Ht 5\' 4"  (1.626 m)   Wt 99 lb 3.2 oz (45 kg)   BMI 17.03 kg/m   Wt Readings from Last 3 Encounters:  03/23/20 99 lb 3.2 oz (45 kg)  03/09/20 97 lb 6.4 oz (44.2 kg)  10/23/19 99 lb 9.6 oz (45.2 kg)    General Appearance:  Alert, cooperative, no distress, appears stated age   Head:  Normocephalic, without obvious abnormality, atraumatic   Eyes:  PERRL, conjunctiva/corneas clear, EOM's intact, fundi benign   Ears:  Normal TM's and external ear canals  Nose:  Not examined, wearing mask due to COVID-19 pandemic   Throat:  Not examined, wearing mask due to COVID-19 pandemic   Neck:  Supple, no lymphadenopathy; thyroid: no enlargement/tenderness/ nodules; no carotid bruit or JVD   Back:  Spine nontender, no curvature, ROM normal, no CVA tenderness   Lungs:  Clear to auscultation bilaterally without wheezes, rales or ronchi; respirations unlabored   Chest Wall:  No tenderness or deformity   Heart:  Regular rate and rhythm, S1 and S2 normal, no murmur, rub or gallop   Breast Exam:  No tenderness, masses, or nipple discharge or inversion. No axillary lymphadenopathy. Nontender, no masses..   Abdomen:  Soft, non-tender, nondistended, normoactive bowel sounds, no masses, no hepatosplenomegaly  Genitalia:  Normal external genitalia without lesions. Mild atrophic changes and vaginal dryness. BUS and vagina normal; no cervical motion tenderness, no cervical lesions. Stenotic os. No  abnormal vaginal discharge. Uterus and adnexa not enlarged, nontender, no masses. Papperformed   Rectal:  Normal tone, no masses or tenderness;heme negative stool  Extremities:  No clubbing, cyanosis or edema. there is trace pitting edema and mild tenderness at left medial ankle only.  Toenails are  somewhat long. Normal monofilament sensation.  Pulses:  2+ and symmetric all extremities   Skin:  Skin color, texture, turgor normal, no rashes or lesions.  Lymph nodes:  Cervical, supraclavicular, and axillary nodes normal   Neurologic:  CNII-XII intact, normal strength, sensation and gait; reflexes 2+ and symmetric throughout   Psych: Normal mood, affect, hygiene and grooming  Normal diabetic foot exam.  Lab Results  Component Value Date   HGBA1C 5.7 (A) 03/23/2020    ASSESSMENT/PLAN:  Annual physical exam  Medicare annual wellness visit, initial  Essential hypertension, benign - well controlled - Plan: Comprehensive metabolic panel  Controlled type 2 diabetes mellitus with other specified complication, without long-term current use of insulin ([place]) - well controlled; discussed periodically checking evening sugars. Diet reviewed. Complications include HTN, HLD - Plan: HgB A1c, Comprehensive metabolic panel  Hypothyroidism, unspecified type - euthyroid by history, due for recheck - Plan: TSH, levothyroxine (SYNTHROID) 75 MCG tablet  Hypertension associated with diabetes ([place])  Pure hypercholesterolemia - Plan: Lipid panel, atorvastatin (LIPITOR) 80 MG tablet  Osteopenia, unspecified location - Discussed, Ca, D and weight-bearing exercise.  Recommend 3 yr f/u DEXA rather than 2, and she should cancel 01/2021 DEXA  Medication monitoring encounter - Plan: Comprehensive metabolic panel, CBC with Differential/Platelet, TSH, Lipid panel  Screening for cervical cancer - reassured that if this is normal, no further pap smears will be needed - Plan: Cytology - PAP([place])  Hyperlipidemia associated with type 2 diabetes mellitus ([place])   Discussed monthly self breast exams and yearly mammograms; at least 30 minutes of aerobic activity at least 5 days/week and weight bearing exercise at least 2x/wk; proper sunscreen use reviewed; healthy diet, including  goals of calcium and vitamin D intake and alcohol recommendations (less than or equal to 1 drink/day) reviewed; regular seatbelt use; changing batteries in smoke detectors. Immunization recommendations discussed--high dose flu shot given.Shingrix recommended, to get at pharmacy, risks/SE reviewed.Colonoscopy recommendations reviewed--UTD, due again 03/2024. Pap smear today (last one, if normal).  DEXA can wait 3 years to repeat, since has been stable (01/2022). Pt advised to cancel 01/2021 scheduled DEXA.  MOST form completed, Full Code, Full Care. Encouraged to get Living Will and Healthcare power of attorneys filled out.  Discussed these in detail, new forms given, requested a copy after notarized.  F/u 6 mos for med check, 1 year for CPE/AWV   Medicare Attestation I have personally reviewed: The patient's medical and social history Their use of alcohol, tobacco or illicit drugs Their current medications and supplements The patient's functional ability including ADLs,fall risks, home safety risks, cognitive, and hearing and visual impairment Diet and physical activities Evidence for depression or mood disorders  The patient's weight, height, BMI have been recorded in the chart.  I have made referrals, counseling, and provided education to the patient based on review of the above and I have provided the patient with a written personalized care plan for preventive services.

## 2020-03-20 NOTE — Patient Instructions (Addendum)
HEALTH MAINTENANCE RECOMMENDATIONS:  It is recommended that you get at least 30 minutes of aerobic exercise at least 5 days/week (for weight loss, you may need as much as 60-90 minutes). This can be any activity that gets your heart rate up. This can be divided in 10-15 minute intervals if needed, but try and build up your endurance at least once a week.  Weight bearing exercise is also recommended twice weekly.  Eat a healthy diet with lots of vegetables, fruits and fiber.  "Colorful" foods have a lot of vitamins (ie green vegetables, tomatoes, red peppers, etc).  Limit sweet tea, regular sodas and alcoholic beverages, all of which has a lot of calories and sugar.  Up to 1 alcoholic drink daily may be beneficial for women (unless trying to lose weight, watch sugars).  Drink a lot of water.  Calcium recommendations are 1200-1500 mg daily (1500 mg for postmenopausal women or women without ovaries), and vitamin D 1000 IU daily.  This should be obtained from diet and/or supplements (vitamins), and calcium should not be taken all at once, but in divided doses.  Monthly self breast exams and yearly mammograms for women over the age of 49 is recommended.  Sunscreen of at least SPF 30 should be used on all sun-exposed parts of the skin when outside between the hours of 10 am and 4 pm (not just when at beach or pool, but even with exercise, golf, tennis, and yard work!)  Use a sunscreen that says "broad spectrum" so it covers both UVA and UVB rays, and make sure to reapply every 1-2 hours.  Remember to change the batteries in your smoke detectors when changing your clock times in the spring and fall. Carbon monoxide detectors are recommended for your home.  Use your seat belt every time you are in a car, and please drive safely and not be distracted with cell phones and texting while driving.   Sierra Combs , Thank you for taking time to come for your Medicare Wellness Visit. I appreciate your ongoing  commitment to your health goals. Please review the following plan we discussed and let me know if I can assist you in the future.   This is a list of the screening recommended for you and due dates:  Health Maintenance  Topic Date Due   Complete foot exam   03/03/2020   Hemoglobin A1C  03/07/2020   Eye exam for diabetics  07/22/2020   Mammogram  02/07/2022   Tetanus Vaccine  12/18/2022   Colon Cancer Screening  03/27/2024   Flu Shot  Completed   DEXA scan (bone density measurement)  Completed   COVID-19 Vaccine  Completed    Hepatitis C: One time screening is recommended by Center for Disease Control  (CDC) for  adults born from [birth year] through 1965.   Completed   Pneumonia vaccines  Completed   A1c and foot exam were done today. Next bone density recommended 01/2022.  Periodically check your sugars in the evening (2 hours after a meal or at bedtime). These may run up to 140 as normal.  If running >150, or morning sugars are >125, please let us know.  We discussed your bone density results.  While insurance pays for the study every 2 years, your bones have been quite stable, and I don't think you need another one until 01/2022 (3 year follow-up rather than 2). If you already schedule this with [facility], you may want to cancel (but keep your mammogram scheduled!)  I recommend getting the new shingles vaccine (Shingrix). If you have Medicare, you will need to get this from the pharmacy, as it is covered by Part D. This is a series of 2 injections, spaced 2 months apart.

## 2020-03-23 ENCOUNTER — Other Ambulatory Visit: Payer: Self-pay

## 2020-03-23 ENCOUNTER — Encounter: Payer: Self-pay | Admitting: Family Medicine

## 2020-03-23 ENCOUNTER — Other Ambulatory Visit (HOSPITAL_COMMUNITY)
Admission: RE | Admit: 2020-03-23 | Discharge: 2020-03-23 | Disposition: A | Discharge status: Home / Self Care | Payer: Medicare Other | Source: Ambulatory Visit | Attending: Family Medicine | Admitting: Family Medicine

## 2020-03-23 ENCOUNTER — Ambulatory Visit (INDEPENDENT_AMBULATORY_CARE_PROVIDER_SITE_OTHER): Payer: Medicare Other | Admitting: Family Medicine

## 2020-03-23 VITALS — BP 120/70 | HR 84 | Ht 64.0 in | Wt 99.2 lb

## 2020-03-23 DIAGNOSIS — E039 Hypothyroidism, unspecified: Secondary | ICD-10-CM | POA: Diagnosis not present

## 2020-03-23 DIAGNOSIS — Z5181 Encounter for therapeutic drug level monitoring: Secondary | ICD-10-CM | POA: Diagnosis not present

## 2020-03-23 DIAGNOSIS — E7800 Pure hypercholesterolemia, unspecified: Secondary | ICD-10-CM

## 2020-03-23 DIAGNOSIS — Z0000 Encounter for general adult medical examination without abnormal findings: Secondary | ICD-10-CM | POA: Diagnosis not present

## 2020-03-23 DIAGNOSIS — Z124 Encounter for screening for malignant neoplasm of cervix: Secondary | ICD-10-CM

## 2020-03-23 DIAGNOSIS — I10 Essential (primary) hypertension: Secondary | ICD-10-CM | POA: Diagnosis not present

## 2020-03-23 DIAGNOSIS — M8580 Other specified disorders of bone density and structure, unspecified site: Secondary | ICD-10-CM

## 2020-03-23 DIAGNOSIS — E785 Hyperlipidemia, unspecified: Secondary | ICD-10-CM

## 2020-03-23 DIAGNOSIS — Z1151 Encounter for screening for human papillomavirus (HPV): Secondary | ICD-10-CM | POA: Diagnosis not present

## 2020-03-23 DIAGNOSIS — I152 Hypertension secondary to endocrine disorders: Secondary | ICD-10-CM

## 2020-03-23 DIAGNOSIS — E1159 Type 2 diabetes mellitus with other circulatory complications: Secondary | ICD-10-CM | POA: Diagnosis not present

## 2020-03-23 DIAGNOSIS — E1169 Type 2 diabetes mellitus with other specified complication: Secondary | ICD-10-CM | POA: Diagnosis not present

## 2020-03-23 LAB — POCT GLYCOSYLATED HEMOGLOBIN (HGB A1C): Hemoglobin A1C: 5.7 % — AB (ref 4.0–5.6)

## 2020-03-24 LAB — CBC WITH DIFFERENTIAL/PLATELET
Basophils Absolute: 0 10*3/uL (ref 0.0–0.2)
Basos: 0 %
EOS (ABSOLUTE): 0 10*3/uL (ref 0.0–0.4)
Eos: 1 %
Hematocrit: 35.7 % (ref 34.0–46.6)
Hemoglobin: 12.3 g/dL (ref 11.1–15.9)
Immature Grans (Abs): 0 10*3/uL (ref 0.0–0.1)
Immature Granulocytes: 0 %
Lymphocytes Absolute: 1.7 10*3/uL (ref 0.7–3.1)
Lymphs: 29 %
MCH: 30.7 pg (ref 26.6–33.0)
MCHC: 34.5 g/dL (ref 31.5–35.7)
MCV: 89 fL (ref 79–97)
Monocytes Absolute: 0.3 10*3/uL (ref 0.1–0.9)
Monocytes: 5 %
Neutrophils Absolute: 4 10*3/uL (ref 1.4–7.0)
Neutrophils: 65 %
Platelets: 309 10*3/uL (ref 150–450)
RBC: 4.01 x10E6/uL (ref 3.77–5.28)
RDW: 12.2 % (ref 11.7–15.4)
WBC: 6 10*3/uL (ref 3.4–10.8)

## 2020-03-24 LAB — TSH: TSH: 3.97 u[IU]/mL (ref 0.450–4.500)

## 2020-03-24 LAB — COMPREHENSIVE METABOLIC PANEL
ALT: 15 IU/L (ref 0–32)
AST: 28 IU/L (ref 0–40)
Albumin/Globulin Ratio: 1.7 (ref 1.2–2.2)
Albumin: 4.3 g/dL (ref 3.8–4.8)
Alkaline Phosphatase: 61 IU/L (ref 44–121)
BUN/Creatinine Ratio: 17 (ref 12–28)
BUN: 17 mg/dL (ref 8–27)
Bilirubin Total: 0.4 mg/dL (ref 0.0–1.2)
CO2: 24 mmol/L (ref 20–29)
Calcium: 9.8 mg/dL (ref 8.7–10.3)
Chloride: 102 mmol/L (ref 96–106)
Creatinine, Ser: 1.01 mg/dL — ABNORMAL HIGH (ref 0.57–1.00)
GFR calc Af Amer: 66 mL/min/{1.73_m2} (ref >59)
GFR calc non Af Amer: 57 mL/min/{1.73_m2} — ABNORMAL LOW (ref >59)
Globulin, Total: 2.6 g/dL (ref 1.5–4.5)
Glucose: 95 mg/dL (ref 65–99)
Potassium: 3.9 mmol/L (ref 3.5–5.2)
Sodium: 143 mmol/L (ref 134–144)
Total Protein: 6.9 g/dL (ref 6.0–8.5)

## 2020-03-24 LAB — LIPID PANEL
Chol/HDL Ratio: 2.2 ratio (ref 0.0–4.4)
Cholesterol, Total: 155 mg/dL (ref 100–199)
HDL: 72 mg/dL (ref >39)
LDL Chol Calc (NIH): 74 mg/dL (ref 0–99)
Triglycerides: 40 mg/dL (ref 0–149)
VLDL Cholesterol Cal: 9 mg/dL (ref 5–40)

## 2020-03-24 MED ORDER — ATORVASTATIN CALCIUM 80 MG PO TABS: ORAL_TABLET | ORAL | 1 refills | Status: DC

## 2020-03-24 MED ORDER — LEVOTHYROXINE SODIUM 75 MCG PO TABS: 75.0000 ug | ORAL_TABLET | Freq: Every day | ORAL | 1 refills | Status: DC

## 2020-03-25 ENCOUNTER — Encounter: Payer: Self-pay | Admitting: Family Medicine

## 2020-03-27 LAB — CYTOLOGY - PAP
Comment: NEGATIVE
Diagnosis: NEGATIVE
High risk HPV: NEGATIVE

## 2020-04-11 DIAGNOSIS — Z9289 Personal history of other medical treatment: Secondary | ICD-10-CM

## 2020-04-11 HISTORY — DX: Personal history of other medical treatment: Z92.89

## 2020-05-25 ENCOUNTER — Other Ambulatory Visit: Payer: Self-pay | Admitting: Family Medicine

## 2020-05-25 DIAGNOSIS — I10 Essential (primary) hypertension: Secondary | ICD-10-CM

## 2020-06-01 ENCOUNTER — Other Ambulatory Visit: Payer: Self-pay | Admitting: Family Medicine

## 2020-06-01 DIAGNOSIS — E7800 Pure hypercholesterolemia, unspecified: Secondary | ICD-10-CM

## 2020-06-01 DIAGNOSIS — E1169 Type 2 diabetes mellitus with other specified complication: Secondary | ICD-10-CM

## 2020-07-23 DIAGNOSIS — H47021 Hemorrhage in optic nerve sheath, right eye: Secondary | ICD-10-CM | POA: Diagnosis not present

## 2020-07-23 DIAGNOSIS — H40013 Open angle with borderline findings, low risk, bilateral: Secondary | ICD-10-CM | POA: Diagnosis not present

## 2020-07-23 DIAGNOSIS — E119 Type 2 diabetes mellitus without complications: Secondary | ICD-10-CM | POA: Diagnosis not present

## 2020-07-23 DIAGNOSIS — H40033 Anatomical narrow angle, bilateral: Secondary | ICD-10-CM | POA: Diagnosis not present

## 2020-07-23 LAB — HM DIABETES EYE EXAM

## 2020-07-30 ENCOUNTER — Encounter: Payer: Self-pay | Admitting: Family Medicine

## 2020-08-20 ENCOUNTER — Other Ambulatory Visit: Payer: Self-pay | Admitting: Family Medicine

## 2020-08-20 DIAGNOSIS — I10 Essential (primary) hypertension: Secondary | ICD-10-CM

## 2020-08-28 ENCOUNTER — Other Ambulatory Visit: Payer: Self-pay | Admitting: Family Medicine

## 2020-08-28 DIAGNOSIS — E1169 Type 2 diabetes mellitus with other specified complication: Secondary | ICD-10-CM

## 2020-09-07 ENCOUNTER — Other Ambulatory Visit: Payer: Self-pay | Admitting: Family Medicine

## 2020-09-07 DIAGNOSIS — E7800 Pure hypercholesterolemia, unspecified: Secondary | ICD-10-CM

## 2020-09-12 ENCOUNTER — Other Ambulatory Visit: Payer: Self-pay | Admitting: Family Medicine

## 2020-09-12 DIAGNOSIS — E039 Hypothyroidism, unspecified: Secondary | ICD-10-CM

## 2020-09-20 NOTE — Progress Notes (Signed)
Chief Complaint  Patient presents with   other    Med check no other issues pt. Has not had 2nd booster yet wants to get it with her husband did not want to get it today.    Underweight/weight loss:  Review of chart documenting her weights:  Wt Readings from Last 3 Encounters:  09/21/20 93 lb 3.2 oz (42.3 kg)  03/23/20 99 lb 3.2 oz (45 kg)  03/09/20 97 lb 6.4 oz (44.2 kg)  03/2020 99# 3.2 oz 10/23/19 99# 9.6 oz 08/30/19 95# 9.6 oz 02/2019 weight 103# 12.8 oz  "I think it is my thyroid"--she has noticed hair loss, complains of dry hair and dry skin.  She feels dehydrated, has to get up and drink water.  +hot flashes and night sweats, but not every night. No palpitations.  She is never cold, often hot. Bowels are normal (diarrhea only from milk or rich foods). She can't even tolerate soy milk and almond milk, caused diarrhea. No problems tolerating bread.  She thinks she is eating better than she used to.  She eats small portions (1/3 of the portion of her husband and daughter).  She reports eating like this for years.  She forgets to snack sometimes.  Snacks on peanut butter crackers, cheese puffs.   Reports she can't tolerate protein bars. She doesn't like nuts. She is picky in what she likes and what her stomach can tolerate. She takes niacin, and reports some limitations in what she can eat with that at night. She does find herself hungry at night.  Hypertension.  She reports compliance with 20mg  of lisinopril, tolerating this without side effects.  BP's have been running 117-134/75-84 (mostly 120's/70's). She continues to limit fast food, and buys no added salt canned foods. She denies dizziness, chest pain, palpitations, cough, edema, headaches.   Hypothyroidism: She reports compliance with taking 14mcg levothyroxine daily, on empty stomach, separate from food and other medications. No change in appearance of medication. She denies changes in energy, +feeling hot, hair loss, dry  skin/hair, weight loss.  Normal bowels, normal moods. Lab Results  Component Value Date   TSH 3.970 03/23/2020    Diabetes follow-up: She is compliant with metformin and denies side effects. She reports the loose stools she has is related to what she eats, not the medication (only certain foods).  Blood sugars at home are running 82-99 in the mornings (doesn't check other times of day). Denies hypoglycemia, polydipsia and polyuria. Eye exams are done yearly, last 07/2020, no retinopathy. Patient follows a low sugar diet and checks feet regularly without concerns. Denies numbness, tingling or burning pain in the feet. H/o microalbuminuria, noted when she had UTI; rechecks since have been normal, due today. Lab Results  Component Value Date   HGBA1C 5.7 (A) 03/23/2020    Hyperlipidemia follow-up: Patient is reportedly following a low-fat, low cholesterol diet. Compliant with medications (atorvastatin, niaspan and fish oil) and denies medication side effects.  No flushing (but does get hot flashes, not related to timing of meds) or myalgias.  Lipids were at goal on last check on this regimen.  Lab Results  Component Value Date   CHOL 155 03/23/2020   HDL 72 03/23/2020   LDLCALC 74 03/23/2020   TRIG 40 03/23/2020   CHOLHDL 2.2 03/23/2020    PMH, PSH, SH reviewed  Outpatient Encounter Medications as of 09/21/2020  Medication Sig Note   atorvastatin (LIPITOR) 80 MG tablet TAKE 1 TABLET(80 MG) BY MOUTH DAILY  Calcium Carbonate-Vitamin D (CALCIUM 600 + D PO) Take 1 tablet by mouth 2 (two) times daily.    fish oil-omega-3 fatty acids 1000 MG capsule Take 1 g by mouth daily.    levothyroxine (SYNTHROID) 75 MCG tablet Take 1 tablet (75 mcg total) by mouth daily before breakfast.    lisinopril (ZESTRIL) 20 MG tablet TAKE 1 TABLET(20 MG) BY MOUTH DAILY    metFORMIN (GLUCOPHAGE) 1000 MG tablet TAKE 1 TABLET(1000 MG) BY MOUTH TWICE DAILY WITH A MEAL    [DISCONTINUED] aspirin 81 MG tablet Take 81  mg by mouth daily. 09/21/2020: stopping niacin; guidelines changed, not recommended   [DISCONTINUED] niacin (NIASPAN) 1000 MG CR tablet TAKE 1 TABLET(1000 MG) BY MOUTH AT BEDTIME 09/21/2020: trial off med to see if still needed   No facility-administered encounter medications on file as of 09/21/2020.   TAKING aspirin and niaspan up until today's visit  No Known Allergies   ROS: no fever, chills, URI symptoms, headaches, dizziness, chest pain, shortness of breath, edema. She denies any GU complaints. No bleeding, bruising, rashes.  No changes to energy.  Weight loss per HPI. Diarrhea related to certain foods, no black or bloody stools. No nausea, vomiting. See HPI   PHYSICAL EXAM:  BP 124/86   Pulse 90   Temp 97.8 F (36.6 C)   Wt 93 lb 3.2 oz (42.3 kg)   BMI 16.00 kg/m   Wt Readings from Last 3 Encounters:  09/21/20 93 lb 3.2 oz (42.3 kg)  03/23/20 99 lb 3.2 oz (45 kg)  03/09/20 97 lb 6.4 oz (44.2 kg)   Well developed, very thin, frail-appearing female in no distress HEENT: PERRL ,EOMI, conjunctiva and sclera are clear, wearing mask Neck: no lymphadenopathy, thyromegaly or mass, no carotid bruit. No supraclavicular adenopathy. Heart: regular rate and rhythm without murmur  Lungs: clear bilaterally   Back: no CVA or spinal tenderness   Abdomen: soft, nontender, no organomegaly or mass   Extremities: no edema, 2+ pulse.  Arthritic changes noted in her fingers bilaterally Skin: no rash, normal sensation Psych: normal mood, affect, hygiene and grooming.   Neuro: alert and oriented, normal gait.  Lab Results  Component Value Date   HGBA1C 6.2 (A) 09/21/2020   HGBA1C 6.2 09/21/2020   HGBA1C 6.2 09/21/2020   HGBA1C 6.2 09/21/2020    ASSESSMENT/PLAN:  Controlled type 2 diabetes mellitus with other specified complication, without long-term current use of insulin (HCC) - complications--HTN, HLD, hypothyroid. Controlled, cont metformin - Plan: HgB A1c, Microalbumin /  creatinine urine ratio, metFORMIN (GLUCOPHAGE) 1000 MG tablet  Hyperlipidemia associated with type 2 diabetes mellitus ([place]) - cont statin  Hypertension associated with diabetes ([place])  Hypothyroidism, unspecified type - recheck given further wt loss, though dry/skin hair so doubt over-replaced. Adjust per results - Plan: TSH  Pure hypercholesterolemia - lipids at goal on last check; trial stopping niaspan, cont lipitor--due to risks of niacin, poss interfering with her eating, contrib to wt loss. May not need. - Plan: Lipid panel, atorvastatin (LIPITOR) 80 MG tablet  Moderate protein-calorie malnutrition ([place]) - counseled in detail re: adequate caloric intake, proper diet; suspect not eating enough. Also discussed Ddx for wt loss. Declined nutrition referral.  Body mass index (BMI) less than 16.5 - counseled re: risks, disc Ddx of wt loss. Counseled re: increased caloric and protein intake. Pt declined referral to nutritionist  Declined COVID booster #2 today, recommended.  She will get with her husband.  She is very picky with respect to her  diet (what she likes, and what bothers her stomach).  Her portions sound much too small, and she gets hungry at night, but restricts what she eats related to the niacin.  Given increased risks of niacin and statins used together, along with lipids being at goal, will try stopping the niacin (and therefore the aspirin, given new guidelines) and monitor lipids. Discussed increased protein intake, more frequent meals (and larger amounts, if tolerated).  Strongly encouraged dietician with journaling her food and portions, to bring to visit, but she would like to hold off on this for now.  Can discuss again at her f/u visit, and she knows she can call anytime for referral. We briefly reviewed Ddx--we reviewed her passive tobacco exposure, and that we did CXR in 2018 for wt loss for same reason. Labs have been fine.  No other symptoms to suggest an underlying  malignancy or problem. Mammo and colonoscopy are UTD. She does have a lot of stomach issues, so must consider absorption issues (rather than just inadequate intake).  Thought of celiac, though she has no problems with gluten-containing foods, so will hold off on testing for this. She wants to work on eating more until her recheck of lipids, and may consider other evaluation/referral if not gaining weight by then.   Per chart, should b for lipitor, thyroid, metformin RF --states she just got atorvastatin and thyroid. Didn't send thyroid RF today (pt states she has, and checking TSH today). Will contact us when needed. Lisinopril and niacin recently filled. Stopping Niacin and aspirin today.   I spent 60 minutes dedicated to the care of this patient, including pre-visit review of records, face to face time, post-visit ordering of testing and documentation.    Stop taking the Niaspan--I don't know that you really need it anymore, the atorvastatin might keep your cholesterol to a good level without it.  It seems that you snack differently than you would otherwise at night, because of this medication. I highly recommend seeing a nutritionist, but if you want to put it off for a few months, that's okay, as long as you really work on increasing your daily intake. Eat more peanut butter (ie peanut butter and banana sandwich) as snacks, or nuts (unsalted).  If you are not gaining weight back, then you definitely need to see the nutritionist.  Please try and record your dietary intake for a week, and bring this to your appointment with the dietician.  I'm holding off on the referral right now, due to your request.  But you can call us at anytime for the referral.  Stop the aspirin (guidelines have changed, it isn't recommended any longer, and you no longer need it to prevent side effects from Niaspan, since we are stopping that medication.)  Return in 3 months for cholesterol check, followed by an  office visit (to discuss the results and recheck your weight).

## 2020-09-21 ENCOUNTER — Ambulatory Visit (INDEPENDENT_AMBULATORY_CARE_PROVIDER_SITE_OTHER): Payer: Medicare Other | Admitting: Family Medicine

## 2020-09-21 ENCOUNTER — Encounter: Payer: Self-pay | Admitting: Family Medicine

## 2020-09-21 VITALS — BP 124/86 | HR 90 | Temp 97.8°F | Wt 93.2 lb

## 2020-09-21 DIAGNOSIS — Z681 Body mass index (BMI) 19 or less, adult: Secondary | ICD-10-CM | POA: Diagnosis not present

## 2020-09-21 DIAGNOSIS — E1159 Type 2 diabetes mellitus with other circulatory complications: Secondary | ICD-10-CM

## 2020-09-21 DIAGNOSIS — I152 Hypertension secondary to endocrine disorders: Secondary | ICD-10-CM

## 2020-09-21 DIAGNOSIS — E039 Hypothyroidism, unspecified: Secondary | ICD-10-CM | POA: Diagnosis not present

## 2020-09-21 DIAGNOSIS — E785 Hyperlipidemia, unspecified: Secondary | ICD-10-CM | POA: Diagnosis not present

## 2020-09-21 DIAGNOSIS — E440 Moderate protein-calorie malnutrition: Secondary | ICD-10-CM

## 2020-09-21 DIAGNOSIS — E7800 Pure hypercholesterolemia, unspecified: Secondary | ICD-10-CM

## 2020-09-21 DIAGNOSIS — E1169 Type 2 diabetes mellitus with other specified complication: Secondary | ICD-10-CM | POA: Diagnosis not present

## 2020-09-21 LAB — POCT GLYCOSYLATED HEMOGLOBIN (HGB A1C)
HbA1c POC (<> result, manual entry): 6.2 % (ref 4.0–5.6)
HbA1c, POC (controlled diabetic range): 6.2 % (ref 0.0–7.0)
HbA1c, POC (prediabetic range): 6.2 % (ref 5.7–6.4)
Hemoglobin A1C: 6.2 % — AB (ref 4.0–5.6)

## 2020-09-21 MED ORDER — METFORMIN HCL 1000 MG PO TABS: ORAL_TABLET | ORAL | 1 refills | Status: DC

## 2020-09-21 MED ORDER — ATORVASTATIN CALCIUM 80 MG PO TABS: ORAL_TABLET | ORAL | 1 refills | Status: DC

## 2020-09-21 NOTE — Patient Instructions (Addendum)
  Stop taking the Niaspan--I don't know that you really need it anymore, the atorvastatin might keep your cholesterol to a good level without it.  It seems that you snack differently than you would otherwise at night, because of this medication. I highly recommend seeing a nutritionist, but if you want to put it off for a few months, that's okay, as long as you really work on increasing your daily intake. Eat more peanut butter (ie peanut butter and banana sandwich) as snacks, or nuts (unsalted).  Stop the aspirin (guidelines have changed, it isn't recommended any longer, and you no longer need it to prevent side effects from Niaspan, since we are stopping that medication.)  If you are not gaining weight back, then you definitely need to see the nutritionist.  Please try and record your dietary intake for a week, and bring this to your appointment with the dietician.  I'm holding off on the referral right now, due to your request.  But you can call us at anytime for the referral.  Return in 3 months for cholesterol check, followed by an office visit (to discuss the results and recheck your weight).  COVID booster is recommended.  Schedule a nurse visit whenever you are ready.

## 2020-09-22 LAB — TSH: TSH: 1.62 u[IU]/mL (ref 0.450–4.500)

## 2020-09-22 LAB — MICROALBUMIN / CREATININE URINE RATIO
Creatinine, Urine: 45.4 mg/dL
Microalb/Creat Ratio: 9 mg/g creat (ref 0–29)
Microalbumin, Urine: 4.2 ug/mL

## 2020-10-23 DIAGNOSIS — H40013 Open angle with borderline findings, low risk, bilateral: Secondary | ICD-10-CM | POA: Diagnosis not present

## 2020-10-23 LAB — HM DIABETES EYE EXAM

## 2020-11-10 ENCOUNTER — Other Ambulatory Visit: Payer: Self-pay | Admitting: Family Medicine

## 2020-11-10 DIAGNOSIS — E7800 Pure hypercholesterolemia, unspecified: Secondary | ICD-10-CM

## 2020-11-17 ENCOUNTER — Other Ambulatory Visit: Payer: Self-pay | Admitting: Family Medicine

## 2020-11-17 DIAGNOSIS — I10 Essential (primary) hypertension: Secondary | ICD-10-CM

## 2020-12-21 ENCOUNTER — Other Ambulatory Visit: Payer: Self-pay

## 2020-12-21 ENCOUNTER — Other Ambulatory Visit: Payer: Medicare Other

## 2020-12-21 DIAGNOSIS — E7800 Pure hypercholesterolemia, unspecified: Secondary | ICD-10-CM | POA: Diagnosis not present

## 2020-12-22 LAB — LIPID PANEL
Chol/HDL Ratio: 2.5 ratio (ref 0.0–4.4)
Cholesterol, Total: 152 mg/dL (ref 100–199)
HDL: 60 mg/dL (ref >39)
LDL Chol Calc (NIH): 81 mg/dL (ref 0–99)
Triglycerides: 52 mg/dL (ref 0–149)
VLDL Cholesterol Cal: 11 mg/dL (ref 5–40)

## 2020-12-22 NOTE — Progress Notes (Signed)
Chief Complaint  Patient presents with   Hypertension    Nonfasting med check. No new concerns. FBS was 81 this am. She did stop taking niacin in June.     Patient presents for 3 month follow-up. This is to f/u on her weight, and also lipids, which she had drawn prior to visit.    Hyperlipidemia: Her lipids had been at goal in 03/2020, on atorvastatin, fish oil and Niaspan.  We decided at June visit to try stopping Niaspan (due to risks, possibly affecting her eating--being very particular with what she could eat in the evening, and contributing to weight loss)--wasn't sure it was truly needed. She is no longer worried about what she could eat after work.  She states she is also eating more in the evenings, no longer feels as hungry in the evenings. Only has some hot flashes with weather changes.  Lab Results  Component Value Date    CHOL 155 03/23/2020    HDL 72 03/23/2020    LDLCALC 74 03/23/2020    TRIG 40 03/23/2020    CHOLHDL 2.2 03/23/2020   Lab Results  Component Value Date   CHOL 152 12/21/2020   HDL 60 12/21/2020   LDLCALC 81 12/21/2020   TRIG 52 12/21/2020   CHOLHDL 2.5 12/21/2020     Hypothyroidism: She reports compliance with taking 31mg levothyroxine daily, on empty stomach, separate from food and other medications.  At her last visit she reported feeling hot, hair loss, dry skin/hair, weight loss.  Normal bowels, normal moods, energy.  Her TSH at that time was normal. Lab Results  Component Value Date   TSH 1.620 09/21/2020    Hypertension.  She reports compliance with '20mg'$  of lisinopril, tolerating this without side effects. She denies dizziness, chest pain, palpitations, cough, edema, headaches. BP was high at home this morning (150/100, pulse in the 90's), recheck was lower (143/90). Usually BP's aren't running high.  She had 2 slices of bacon yesterday.  She had some Spaghetti-O's recently. Blood pressures are usually good.  She reports that she recently went  to the dentist because she needed a tooth pulled, but dentist wouldn't do it due to BP being 180. BP Readings from Last 3 Encounters:  12/23/20 (!) 144/90  09/21/20 124/86  03/23/20 120/70    Reports being "stretched thin", a little depressed.  Knows it is time to make time for herself, but hasn't yet.   PMH, PSH, SH reviewed  Outpatient Encounter Medications as of 12/23/2020  Medication Sig   atorvastatin (LIPITOR) 80 MG tablet TAKE 1 TABLET(80 MG) BY MOUTH DAILY   Calcium Carbonate-Vitamin D (CALCIUM 600 + D PO) Take 1 tablet by mouth 2 (two) times daily.   fish oil-omega-3 fatty acids 1000 MG capsule Take 1 g by mouth daily.   levothyroxine (SYNTHROID) 75 MCG tablet Take 1 tablet (75 mcg total) by mouth daily before breakfast.   lisinopril (ZESTRIL) 20 MG tablet TAKE 1 TABLET(20 MG) BY MOUTH DAILY   metFORMIN (GLUCOPHAGE) 1000 MG tablet TAKE 1 TABLET(1000 MG) BY MOUTH TWICE DAILY WITH A MEAL   Multiple Vitamins-Minerals (MULTIVITAMIN WITH MINERALS) tablet Take 1 tablet by mouth daily.   No facility-administered encounter medications on file as of 12/23/2020.   No Known Allergies  ROS:  no fever, chills, URI symptoms, headaches, dizziness, chest pain, shortness of breath, GI complaints, GU complaints.  Weight stable.  Good appetite.  +stress, slightly depressed.   PHYSICAL EXAM:  BP (!) 144/90 (BP Location: Right Arm,  Cuff Size: Normal)   Pulse 84   Ht '5\' 4"'$  (1.626 m)   Wt 93 lb 6.4 oz (42.4 kg)   BMI 16.03 kg/m   Recheck much higher (123XX123 systolic).  Wt Readings from Last 3 Encounters:  12/23/20 93 lb 6.4 oz (42.4 kg)  09/21/20 93 lb 3.2 oz (42.3 kg)  03/23/20 99 lb 3.2 oz (45 kg)   Well developed, very thin, frail-appearing female in no distress HEENT: PERRL ,EOMI, conjunctiva and sclera are clear, wearing mask Neck: no lymphadenopathy, thyromegaly or mass, no carotid bruit.  Heart: regular rate and rhythm without murmur  Lungs: clear bilaterally   Back: no CVA or  spinal tenderness   Abdomen: soft, nontender, no organomegaly or mass   Extremities: no edema, 2+ pulses Skin: no rash, normal sensation Psych: slightly anxious, full range of affect, normal hygiene and grooming.   Neuro: alert and oriented, normal gait.   ASSESSMENT/PLAN:  Hyperlipidemia associated with type 2 diabetes mellitus (HCC) - lipids remain at goal even after d/c niaspan.  Cont statin and fish oil  Need for influenza vaccination - Plan: Flu Vaccine QUAD High Dose(Fluad)  Essential hypertension, benign - BP high today and at dentist, prev normal. Reviewed low Na diet, stress reduction, regular exercise. Monitor regularly and f/u if remains high  Body mass index (BMI) less than 16.5 - Risks reviewed.  Encouraged weight-bearing exercise to protect her bones - Plan: Amb ref to Medical Nutrition Therapy-MNT  Moderate protein-calorie malnutrition ([place]) - counseled re: diet--increase portions, eat every 2 hours, at least 3 snacks/day. Now agrees to referral to nutritionist for assistance - Plan: Amb ref to Medical Nutrition Therapy-MNT  HTN--low Na diet, regular exercise. Cont to monitor. Stress may be a factor--went much higher when discussing it at visit.  Anxiety may contribute to higher BP at dentist. If high on repeat at dentist, when normal at home, can contact us for some xanax, and understands she needs someone to drive her.  We discussed the affect of stress and her moods on her blood pressure.  Counseled re: taking time for herself, keeping herself busy during retirement. Consider counseling if continues to have decreased mood, or f/u here to discuss potential treatments.  Pt to bring list of BP's (page provided) to her next visit.   F/u in December, sooner prn.

## 2020-12-23 ENCOUNTER — Other Ambulatory Visit: Payer: Self-pay

## 2020-12-23 ENCOUNTER — Ambulatory Visit (INDEPENDENT_AMBULATORY_CARE_PROVIDER_SITE_OTHER): Payer: Medicare Other | Admitting: Family Medicine

## 2020-12-23 ENCOUNTER — Encounter: Payer: Self-pay | Admitting: Family Medicine

## 2020-12-23 VITALS — BP 144/90 | HR 84 | Ht 64.0 in | Wt 93.4 lb

## 2020-12-23 DIAGNOSIS — E440 Moderate protein-calorie malnutrition: Secondary | ICD-10-CM | POA: Diagnosis not present

## 2020-12-23 DIAGNOSIS — Z681 Body mass index (BMI) 19 or less, adult: Secondary | ICD-10-CM | POA: Diagnosis not present

## 2020-12-23 DIAGNOSIS — Z23 Encounter for immunization: Secondary | ICD-10-CM

## 2020-12-23 DIAGNOSIS — I10 Essential (primary) hypertension: Secondary | ICD-10-CM

## 2020-12-23 DIAGNOSIS — E785 Hyperlipidemia, unspecified: Secondary | ICD-10-CM

## 2020-12-23 DIAGNOSIS — E1169 Type 2 diabetes mellitus with other specified complication: Secondary | ICD-10-CM

## 2020-12-23 NOTE — Patient Instructions (Addendum)
Please try and eat something every 2-3 hours. Your meals are very small--if you can't eat more at one time, then we need to eat more frequently, to ensure that you get adequate nutrition from your diet.  We are going to refer you to a nutritionist/dietician to help you evaluate your current diet and hopefully help get you to a healthier weight. Please keep a food journal for about a week prior to your visit with the nutritionist, and bring it with you to your visit. Try and be detailed in quantities and how food was prepared (ie if chicken was baked or fried, portion size, etc).  Continue to try and limit sodium in your diet (goal is under '2000mg'$  daily).  High salt intake can affect blood pressure. Your blood pressure was high today. It was fine at your last visit.  Please continue to monitor your blood pressure at home. If it stays over 135-140/85-90, then we need to adjust your medication dose.  Be sure to get regular exercise which also helps lower blood pressure.  Bring the list of blood pressures (on the paper provided) to your next visit in December.  Come sooner if your blood pressures are consistently high.  If your blood pressure is normal at home, but is still very high when you go to the dentist, you might need a medication to help with your nerves (anxiety about the dentist and potential pain can cause very high blood pressure). If you took a medication like that, it could make you sleepy, and you'd need somebody else to drive you to the visit.  Work on getting the blood pressure down, and try at the dentist again.  Contact us for prescription if your blood pressure is only high at the dentist.   Be sure to take time for yourself--we discussed regular exercise, an hour a day of something you really enjoy (including possibly new hobbies). If your moods are not improving, please return to discuss.

## 2021-01-07 ENCOUNTER — Ambulatory Visit (INDEPENDENT_AMBULATORY_CARE_PROVIDER_SITE_OTHER): Payer: Medicare Other

## 2021-01-07 ENCOUNTER — Other Ambulatory Visit: Payer: Self-pay

## 2021-01-07 DIAGNOSIS — Z23 Encounter for immunization: Secondary | ICD-10-CM

## 2021-02-08 DIAGNOSIS — Z1231 Encounter for screening mammogram for malignant neoplasm of breast: Secondary | ICD-10-CM | POA: Diagnosis not present

## 2021-02-08 LAB — HM MAMMOGRAPHY

## 2021-02-13 ENCOUNTER — Other Ambulatory Visit: Payer: Self-pay | Admitting: Family Medicine

## 2021-02-13 DIAGNOSIS — I10 Essential (primary) hypertension: Secondary | ICD-10-CM

## 2021-02-16 ENCOUNTER — Encounter: Payer: Self-pay | Admitting: Internal Medicine

## 2021-02-21 ENCOUNTER — Other Ambulatory Visit: Payer: Self-pay | Admitting: Family Medicine

## 2021-02-21 DIAGNOSIS — E7800 Pure hypercholesterolemia, unspecified: Secondary | ICD-10-CM

## 2021-03-10 ENCOUNTER — Other Ambulatory Visit: Payer: Self-pay | Admitting: Family Medicine

## 2021-03-10 DIAGNOSIS — E039 Hypothyroidism, unspecified: Secondary | ICD-10-CM

## 2021-03-10 DIAGNOSIS — E1169 Type 2 diabetes mellitus with other specified complication: Secondary | ICD-10-CM

## 2021-03-22 ENCOUNTER — Other Ambulatory Visit: Payer: Self-pay

## 2021-03-22 ENCOUNTER — Ambulatory Visit (INDEPENDENT_AMBULATORY_CARE_PROVIDER_SITE_OTHER): Payer: Medicare Other | Admitting: Family

## 2021-03-22 ENCOUNTER — Encounter: Payer: Self-pay | Admitting: Family

## 2021-03-22 VITALS — BP 140/80 | HR 87 | Temp 97.1°F | Resp 18 | Ht 64.0 in | Wt 95.2 lb

## 2021-03-22 DIAGNOSIS — Z7689 Persons encountering health services in other specified circumstances: Secondary | ICD-10-CM | POA: Diagnosis not present

## 2021-03-22 DIAGNOSIS — E039 Hypothyroidism, unspecified: Secondary | ICD-10-CM

## 2021-03-22 DIAGNOSIS — M85852 Other specified disorders of bone density and structure, left thigh: Secondary | ICD-10-CM

## 2021-03-22 DIAGNOSIS — M159 Polyosteoarthritis, unspecified: Secondary | ICD-10-CM | POA: Diagnosis not present

## 2021-03-22 DIAGNOSIS — I10 Essential (primary) hypertension: Secondary | ICD-10-CM

## 2021-03-22 DIAGNOSIS — E782 Mixed hyperlipidemia: Secondary | ICD-10-CM

## 2021-03-22 DIAGNOSIS — E1169 Type 2 diabetes mellitus with other specified complication: Secondary | ICD-10-CM

## 2021-03-22 DIAGNOSIS — R197 Diarrhea, unspecified: Secondary | ICD-10-CM | POA: Diagnosis not present

## 2021-03-22 DIAGNOSIS — M85851 Other specified disorders of bone density and structure, right thigh: Secondary | ICD-10-CM

## 2021-03-22 DIAGNOSIS — E785 Hyperlipidemia, unspecified: Secondary | ICD-10-CM | POA: Diagnosis not present

## 2021-03-22 NOTE — Progress Notes (Signed)
Provider: Marlowe Sax FNP-C   Skye Plamondon, Nelda Bucks, NP  Patient Care Team: Norene Oliveri, Nelda Bucks, NP as PCP - General (Family Medicine)  Extended Emergency Contact Information Primary Emergency Contact: Feinberg,Melvin Address: [place], [place] Phone: [phone] Relation: Spouse Secondary Emergency Contact: Kreuser,[name]  [place] Phone: [phone] Relation: Daughter  Code Status:  Full Code  Goals of care: Advanced Directive information Advanced Directives 03/22/2021  Does Patient Have a Medical Advance Directive? Yes  Type of Advance Directive Out of facility DNR (pink MOST or yellow form)  Does patient want to make changes to medical advance directive? No - Patient declined  Would patient like information on creating a medical advance directive? -  Pre-existing out of facility DNR order (yellow form or pink MOST form) Pink MOST form placed in chart (order not valid for inpatient use)     Chief Complaint  Patient presents with   Establish Care    New patient to establish care.Patient has concerns about weight loss. Patient thinks she eats enough food. Patient has been having diarrhea for a few months. Patient states that when she eats she has diarrhea. Patient has been dealing with weight loss for about a year    HPI:  Pt is a 70 y.o. female seen today to establish care here at [facility] for medical management of chronic diseases.Has a medical history of Controlled Type 2 Diabetes Mellitus,Hypertension,Hypothyroidism,Osteoarthritis,Osteopenia,Hot flushes among others. 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  She eats three meals but small portion.Has had for many years. Weight  previous visit 93 lbs but now up 95 lbs. She threw away her weighing scale at home since it was inconsistent with her PCP's.  Does have intermittent diarrhea after she eats.No abdominal pain or cramping.she denies any nausea,vomiting or blood in the stool.   Hypothyroidism - takes medication on empty stomach.latest TSH level on chart was within normal range 1.620 ( 09/21/2020).   Hyperlipidemia - likes to eat cheese gets a bag.on Atorvastatin 80 mg tablet daily and Fish oil.   Type 2 DM - well controlled.watches what she eats.has cut down on drinking sodas. Readings range in the 80 - 100's.checks CBG every 3 days. Had stopped taking ASA 81  Exercise by lifting small weights and also dances once a day.   Osteoarthritis - worst on finger joints and right knee.Has not required any pain medication   Osteopenia  - on calcium- Vit D supplement. Last bone density reviewed on chart done 02/06/2019 right femoral neck T-score was -1.7 which was stable compared to previous bone density done 01/31/2017  RFN T-score was -1.7   Hot Flushes - not as bad as it used to be.   Immunization are up to date except due to shingles vaccine.made aware to get vaccine at her pharmacy.   Past Medical History:  Diagnosis Date   Arthritis    Breast cyst 3cm L breast   Colonic polyp    Diabetes mellitus type 2    Diverticulosis    Fatty liver    H/O bone density study 2020   H/O mammogram 2022   Hyperlipidemia small particles 6/06   Hypertension    Menopause  04/11/2000   Osteopenia L hip   Unspecified hypothyroidism 09/09/2010   Past Surgical History:  Procedure Laterality Date   BREAST BIOPSY  7/06 L breast benign   CESAREAN SECTION     COLONOSCOPY  6/05, 09/2008, 11/2013   Dr. Penelope Coop    No Known Allergies  Allergies as of 03/22/2021   No Known Allergies      Medication List        Accurate as of March 22, 2021 10:55 AM. If you have any questions, ask your nurse or doctor.           atorvastatin 80 MG tablet Commonly known as: LIPITOR TAKE 1 TABLET(80 MG) BY MOUTH DAILY   CALCIUM 600 + D PO Take 1 tablet by mouth 2 (two) times daily.   fish oil-omega-3 fatty acids 1000 MG capsule Take 1 g by mouth daily.   levothyroxine 75 MCG tablet Commonly known as: SYNTHROID TAKE 1 TABLET(75 MCG) BY MOUTH DAILY BEFORE AND BREAKFAST   lisinopril 20 MG tablet Commonly known as: ZESTRIL TAKE 1 TABLET(20 MG) BY MOUTH DAILY   metFORMIN 1000 MG tablet Commonly known as: GLUCOPHAGE TAKE 1 TABLET(1000 MG) BY MOUTH TWICE DAILY WITH A MEAL   multivitamin with minerals tablet Take 1 tablet by mouth daily.        Review of Systems  Constitutional:  Negative for appetite change, chills, fatigue, fever and unexpected weight change.  HENT:  Negative for congestion, dental problem, ear discharge, ear pain, facial swelling, hearing loss, nosebleeds, postnasal drip, rhinorrhea, sinus pressure, sinus pain, sneezing, sore throat, tinnitus and trouble swallowing.   Eyes:  Positive for visual disturbance. Negative for pain, discharge, redness and itching.       Wears eye glasses follows up with Ophthalmology has appt 07/2021  Glaucoma and cataract   Respiratory:  Negative for cough, chest tightness, shortness of breath and wheezing.   Cardiovascular:  Negative for chest pain, palpitations and leg swelling.  Gastrointestinal:  Positive for diarrhea. Negative for abdominal distention, abdominal pain, blood in stool, constipation, nausea and vomiting.       Diarrhea occasional after eating  Endocrine: Negative for polydipsia, polyphagia and polyuria.       Hot flushes has improved   Genitourinary:  Negative for difficulty urinating, dysuria, flank pain, frequency and urgency.  Musculoskeletal:  Positive for arthralgias and joint swelling. Negative for back pain, gait problem, myalgias, neck pain and neck stiffness.       Arthritis on fingers and right knee   Skin:  Negative for color  change, pallor, rash and wound.  Neurological:  Negative for dizziness, syncope, speech difficulty, weakness, light-headedness, numbness and headaches.  Hematological:  Does not bruise/bleed easily.  Psychiatric/Behavioral:  Negative for agitation, behavioral problems, confusion, hallucinations, self-injury, sleep disturbance and suicidal ideas. The patient is not nervous/anxious.        Sometimes feels depressed but tries to stay busy.works part time in the evening to stay busy.    Immunization History  Administered Date(s) Administered   Fluad Quad(high Dose 65+) 03/04/2019, 03/09/2020, 12/23/2020   Influenza Split 02/10/2012   Influenza, High Dose Seasonal PF 02/15/2017, 02/02/2018   Influenza-Unspecified 01/13/2015, 02/01/2016   PFIZER(Purple Top)SARS-COV-2 Vaccination 06/07/2019, 07/02/2019, 02/08/2020   Pfizer Covid-19 Vaccine Bivalent Booster 18yr & up 01/07/2021   Pneumococcal Conjugate-13 02/25/2016   Pneumococcal Polysaccharide-23 05/04/2007, 03/01/2017   Td 06/17/1997   Tdap 05/04/2007, 12/17/2012   Zoster, Live 12/30/2013   Pertinent  Health Maintenance Due  Topic Date Due  FOOT EXAM  03/23/2021   HEMOGLOBIN A1C  03/23/2021   OPHTHALMOLOGY EXAM  10/23/2021   MAMMOGRAM  02/09/2023   COLONOSCOPY (Pts 45-72yr Insurance coverage will need to be confirmed)  03/27/2024   INFLUENZA VACCINE  Completed   DEXA SCAN  Completed   Fall Risk 03/04/2019 10/23/2019 03/23/2020 12/23/2020 03/22/2021  Falls in the past year? 0 0 0 0 0  Was there an injury with Fall? - - 0 0 0  Fall Risk Category Calculator - - 0 0 0  Fall Risk Category - - Low Low Low  Patient Fall Risk Level - Low fall risk - Low fall risk Low fall risk  Patient at Risk for Falls Due to - - - No Fall Risks No Fall Risks  Fall risk Follow up - - - Falls evaluation completed Falls evaluation completed   Functional Status Survey:    Vitals:   03/22/21 1042  BP: 140/80  Pulse: 87  Resp: 18  Temp: (!) 97.1 F  (36.2 C)  TempSrc: Temporal  SpO2: 98%  Weight: 95 lb 3.2 oz (43.2 kg)  Height: _0  (1.626 m)   Body mass index is 16.34 kg/m. Physical Exam Vitals reviewed.  Constitutional:      General: She is not in acute distress.    Appearance: Normal appearance. She is underweight. She is not ill-appearing or diaphoretic.  HENT:     Head: Normocephalic.     Right Ear: Tympanic membrane, ear canal and external ear normal. There is no impacted cerumen.     Left Ear: Tympanic membrane, ear canal and external ear normal. There is no impacted cerumen.     Nose: Nose normal. No congestion or rhinorrhea.     Mouth/Throat:     Mouth: Mucous membranes are moist.     Pharynx: Oropharynx is clear. No oropharyngeal exudate or posterior oropharyngeal erythema.  Eyes:     General: No scleral icterus.       Right eye: No discharge.        Left eye: No discharge.     Extraocular Movements: Extraocular movements intact.     Conjunctiva/sclera: Conjunctivae normal.     Pupils: Pupils are equal, round, and reactive to light.  Neck:     Vascular: No carotid bruit.  Cardiovascular:     Rate and Rhythm: Normal rate and regular rhythm.     Pulses: Normal pulses.     Heart sounds: Normal heart sounds. No murmur heard.   No friction rub. No gallop.  Pulmonary:     Effort: Pulmonary effort is normal. No respiratory distress.     Breath sounds: Normal breath sounds. No wheezing, rhonchi or rales.  Chest:     Chest wall: No tenderness.  Abdominal:     General: Bowel sounds are normal. There is no distension.     Palpations: Abdomen is soft. There is no mass.     Tenderness: There is no abdominal tenderness. There is no right CVA tenderness, left CVA tenderness, guarding or rebound.  Musculoskeletal:        General: No swelling or tenderness. Normal range of motion.     Cervical back: Normal range of motion. No rigidity or tenderness.     Right lower leg: No edema.     Left lower leg: No edema.   Lymphadenopathy:     Cervical: No cervical adenopathy.  Skin:    General: Skin is warm and dry.     Coloration: Skin is not pale.  Findings: No bruising, erythema, lesion or rash.  Neurological:     Mental Status: She is alert and oriented to person, place, and time.     Cranial Nerves: No cranial nerve deficit.     Sensory: No sensory deficit.     Motor: No weakness.     Coordination: Coordination normal.     Gait: Gait normal.  Psychiatric:        Mood and Affect: Mood normal.        Speech: Speech normal.        Behavior: Behavior normal.        Thought Content: Thought content normal.        Judgment: Judgment normal.    Labs reviewed: Recent Labs    03/23/20 0952  NA 143  K 3.9  CL 102  CO2 24  GLUCOSE 95  BUN 17  CREATININE 1.01*  CALCIUM 9.8   Recent Labs    03/23/20 0952  AST 28  ALT 15  ALKPHOS 61  BILITOT 0.4  PROT 6.9  ALBUMIN 4.3   Recent Labs    03/23/20 0952  WBC 6.0  NEUTROABS 4.0  HGB 12.3  HCT 35.7  MCV 89  PLT 309   Lab Results  Component Value Date   TSH 1.620 09/21/2020   Lab Results  Component Value Date   HGBA1C 6.2 (A) 09/21/2020   HGBA1C 6.2 09/21/2020   HGBA1C 6.2 09/21/2020   HGBA1C 6.2 09/21/2020   Lab Results  Component Value Date   CHOL 152 12/21/2020   HDL 60 12/21/2020   LDLCALC 81 12/21/2020   TRIG 52 12/21/2020   CHOLHDL 2.5 12/21/2020    Significant Diagnostic Results in last 30 days:  No results found.  Assessment/Plan 1. Essential hypertension, benign B/p not at goal  - continue on lisinopril  - Advised to check Blood pressure at home and record on log and notify provider if B/p > 140/90 consistently for three days.  - CBC with Differential/Platelet; Future - CMP with eGFR([facility]); Future  2. Acquired hypothyroidism Lab Results  Component Value Date   TSH 1.620 09/21/2020  Continue on levothyroxine 75 mcg tablet on any empty stomach  - TSH; Future  3. Hyperlipidemia LDL goal  <100 Latest LDL at goal  - Dietary modification and exercise at least 3 times per week for 30 minutes advised. - Lipid panel; Future  4. Encounter to establish care Available medical record reviewed.Immunization reviewed up to date except due for shingles vaccine.made aware to get vaccine at her pharmacy.   5. DM type 2 with diabetic mixed hyperlipidemia ([place]) Latest Hgb A1C 6.2 ( 09/2020 ) No reports of hypoglycemia Will recheck A1C then adjust medication if indicated.  - continue on Metformin 1000 mg tablet  twice daily  - continue on ACE inhibitor for renal protection  - on Statin for cardiac event prevention.  - foot exam completed this visit no abnormalities and sensation intact.  - Hemoglobin A1c; Future  6. Diarrhea, unspecified type Intermittent worst after eating.  Will send to GI if symptoms worsen.  - Aerobic Culture; Future  7. Osteopenia of both hips Bone density on chart reviewed done 02/05/2021 indicated T-score -1.7 on right Femoral neck no changes compared to previous bone density done 01/31/2017  - continue weight bearing exercise  - continue Calcium carbonate - Vit D twice daily.  8. Primary osteoarthritis involving multiple joints Continue OTC analgesics as needed  - continue exercise   Family/ staff Communication: Reviewed  plan of care with patient verbalized understanding   Labs/tests ordered:  - CBC with Differential/Platelet - CMP with eGFR([facility]) - TSH - Hgb A1C - Lipid panel  Next Appointment : 6 months for medical management of chronic issues.Fasting Labs in one week or sooner.     Sandrea Hughs, NP

## 2021-03-30 ENCOUNTER — Other Ambulatory Visit: Payer: Self-pay

## 2021-03-30 ENCOUNTER — Other Ambulatory Visit: Payer: Medicare Other

## 2021-03-30 DIAGNOSIS — E039 Hypothyroidism, unspecified: Secondary | ICD-10-CM

## 2021-03-30 DIAGNOSIS — E1169 Type 2 diabetes mellitus with other specified complication: Secondary | ICD-10-CM | POA: Diagnosis not present

## 2021-03-30 DIAGNOSIS — E782 Mixed hyperlipidemia: Secondary | ICD-10-CM | POA: Diagnosis not present

## 2021-03-30 DIAGNOSIS — E785 Hyperlipidemia, unspecified: Secondary | ICD-10-CM

## 2021-03-30 DIAGNOSIS — I10 Essential (primary) hypertension: Secondary | ICD-10-CM

## 2021-03-30 DIAGNOSIS — R197 Diarrhea, unspecified: Secondary | ICD-10-CM

## 2021-04-01 ENCOUNTER — Ambulatory Visit: Payer: Medicare Other | Admitting: Family Medicine

## 2021-04-01 LAB — CBC WITH DIFFERENTIAL/PLATELET
Absolute Monocytes: 390 cells/uL (ref 200–950)
Basophils Absolute: 31 cells/uL (ref 0–200)
Basophils Relative: 0.5 %
Eosinophils Absolute: 110 cells/uL (ref 15–500)
Eosinophils Relative: 1.8 %
HCT: 35.7 % (ref 35.0–45.0)
Hemoglobin: 12 g/dL (ref 11.7–15.5)
Lymphs Abs: 1806 cells/uL (ref 850–3900)
MCH: 30.7 pg (ref 27.0–33.0)
MCHC: 33.6 g/dL (ref 32.0–36.0)
MCV: 91.3 fL (ref 80.0–100.0)
MPV: 9.9 fL (ref 7.5–12.5)
Monocytes Relative: 6.4 %
Neutro Abs: 3764 cells/uL (ref 1500–7800)
Neutrophils Relative %: 61.7 %
Platelets: 379 10*3/uL (ref 140–400)
RBC: 3.91 10*6/uL (ref 3.80–5.10)
RDW: 13.1 % (ref 11.0–15.0)
Total Lymphocyte: 29.6 %
WBC: 6.1 10*3/uL (ref 3.8–10.8)

## 2021-04-01 LAB — SALMONELLA/SHIGELLA CULT, CAMPY EIA AND SHIGA TOXIN RFL ECOLI
MICRO NUMBER: 12781326
MICRO NUMBER:: 12781327
MICRO NUMBER:: 12781328
Result:: NOT DETECTED
SHIGA RESULT:: NOT DETECTED
SPECIMEN QUALITY: ADEQUATE
SPECIMEN QUALITY:: ADEQUATE
SPECIMEN QUALITY:: ADEQUATE

## 2021-04-01 LAB — COMPLETE METABOLIC PANEL WITH GFR
AG Ratio: 1.5 (calc) (ref 1.0–2.5)
ALT: 12 U/L (ref 6–29)
AST: 21 U/L (ref 10–35)
Albumin: 4 g/dL (ref 3.6–5.1)
Alkaline phosphatase (APISO): 50 U/L (ref 37–153)
BUN: 18 mg/dL (ref 7–25)
CO2: 30 mmol/L (ref 20–32)
Calcium: 9.4 mg/dL (ref 8.6–10.4)
Chloride: 104 mmol/L (ref 98–110)
Creat: 0.82 mg/dL (ref 0.60–1.00)
Globulin: 2.7 g/dL (calc) (ref 1.9–3.7)
Glucose, Bld: 80 mg/dL (ref 65–99)
Potassium: 4 mmol/L (ref 3.5–5.3)
Sodium: 142 mmol/L (ref 135–146)
Total Bilirubin: 0.4 mg/dL (ref 0.2–1.2)
Total Protein: 6.7 g/dL (ref 6.1–8.1)
eGFR: 77 mL/min/{1.73_m2} (ref >=60)

## 2021-04-01 LAB — HEMOGLOBIN A1C
Hgb A1c MFr Bld: 6 % of total Hgb — ABNORMAL HIGH (ref <5.7)
Mean Plasma Glucose: 126 mg/dL
eAG (mmol/L): 7 mmol/L

## 2021-04-01 LAB — LIPID PANEL
Cholesterol: 145 mg/dL (ref <200)
HDL: 54 mg/dL (ref >=50)
LDL Cholesterol (Calc): 78 mg/dL (calc)
Non-HDL Cholesterol (Calc): 91 mg/dL (calc) (ref <130)
Total CHOL/HDL Ratio: 2.7 (calc) (ref <5.0)
Triglycerides: 45 mg/dL (ref <150)

## 2021-04-01 LAB — TSH: TSH: 1.2 mIU/L (ref 0.40–4.50)

## 2021-04-02 ENCOUNTER — Ambulatory Visit: Payer: Medicare Other | Admitting: Family

## 2021-05-13 ENCOUNTER — Other Ambulatory Visit: Payer: Self-pay | Admitting: Family Medicine

## 2021-05-13 ENCOUNTER — Other Ambulatory Visit: Payer: Self-pay

## 2021-05-13 DIAGNOSIS — E039 Hypothyroidism, unspecified: Secondary | ICD-10-CM

## 2021-05-13 DIAGNOSIS — I10 Essential (primary) hypertension: Secondary | ICD-10-CM

## 2021-05-13 DIAGNOSIS — E1169 Type 2 diabetes mellitus with other specified complication: Secondary | ICD-10-CM

## 2021-05-13 DIAGNOSIS — E7800 Pure hypercholesterolemia, unspecified: Secondary | ICD-10-CM

## 2021-05-13 MED ORDER — LISINOPRIL 20 MG PO TABS: ORAL_TABLET | ORAL | 1 refills | Status: DC

## 2021-05-13 MED ORDER — LEVOTHYROXINE SODIUM 75 MCG PO TABS: ORAL_TABLET | ORAL | 1 refills | Status: DC

## 2021-05-13 MED ORDER — ATORVASTATIN CALCIUM 80 MG PO TABS: ORAL_TABLET | ORAL | 1 refills | Status: DC

## 2021-05-13 MED ORDER — METFORMIN HCL 1000 MG PO TABS: ORAL_TABLET | ORAL | 1 refills | Status: DC

## 2021-06-13 ENCOUNTER — Other Ambulatory Visit: Payer: Self-pay | Admitting: Family Medicine

## 2021-06-13 DIAGNOSIS — E039 Hypothyroidism, unspecified: Secondary | ICD-10-CM

## 2021-06-13 DIAGNOSIS — E1169 Type 2 diabetes mellitus with other specified complication: Secondary | ICD-10-CM

## 2021-09-22 ENCOUNTER — Encounter: Payer: Self-pay | Admitting: Family

## 2021-09-22 ENCOUNTER — Ambulatory Visit (INDEPENDENT_AMBULATORY_CARE_PROVIDER_SITE_OTHER): Payer: Medicare Other | Admitting: Family

## 2021-09-22 VITALS — BP 130/84 | HR 95 | Temp 97.4°F | Ht 63.0 in | Wt 90.8 lb

## 2021-09-22 DIAGNOSIS — E039 Hypothyroidism, unspecified: Secondary | ICD-10-CM | POA: Diagnosis not present

## 2021-09-22 DIAGNOSIS — I10 Essential (primary) hypertension: Secondary | ICD-10-CM

## 2021-09-22 DIAGNOSIS — Z681 Body mass index (BMI) 19 or less, adult: Secondary | ICD-10-CM | POA: Insufficient documentation

## 2021-09-22 DIAGNOSIS — E782 Mixed hyperlipidemia: Secondary | ICD-10-CM | POA: Diagnosis not present

## 2021-09-22 DIAGNOSIS — M159 Polyosteoarthritis, unspecified: Secondary | ICD-10-CM

## 2021-09-22 DIAGNOSIS — E441 Mild protein-calorie malnutrition: Secondary | ICD-10-CM | POA: Diagnosis not present

## 2021-09-22 DIAGNOSIS — E1169 Type 2 diabetes mellitus with other specified complication: Secondary | ICD-10-CM

## 2021-09-22 DIAGNOSIS — E785 Hyperlipidemia, unspecified: Secondary | ICD-10-CM | POA: Diagnosis not present

## 2021-09-22 DIAGNOSIS — R634 Abnormal weight loss: Secondary | ICD-10-CM | POA: Diagnosis not present

## 2021-09-22 NOTE — Assessment & Plan Note (Signed)
Lab Results  Component Value Date   TSH 1.20 03/30/2021  -Continue on levothyroxine 75 mcg daily on empty stomach 2 hours away from the medication -We will recheck TSH today

## 2021-09-22 NOTE — Assessment & Plan Note (Signed)
-   Blood pressure at goal -Previous creatinine within normal range  -Continue on lisinopril

## 2021-09-22 NOTE — Patient Instructions (Signed)
-   Please get your shingles and COVID-19 booster vaccine at the pharmacy 

## 2021-09-22 NOTE — Assessment & Plan Note (Signed)
Recalls CBGs at home ranges in the 80s to 100 -We will recheck hemoglobin A1c if within normal range reduce metformin due to ongoing weight loss.

## 2021-09-22 NOTE — Assessment & Plan Note (Signed)
Checking CMP today

## 2021-09-22 NOTE — Progress Notes (Addendum)
 Provider: Dinah Ngetich FNP-C   Ngetich, Dinah C, NP  Patient Care Team: Ngetich, Dinah C, NP as PCP - General (Family Medicine)  Extended Emergency Contact Information Primary Emergency Contact: Walrath,Melvin Address: [address]          [place], [place] Home Phone: [phone] Relation: Spouse Secondary Emergency Contact: Kelsay,[name]  [place] Home Phone: [phone] Relation: Daughter  Code Status:  Full Code  Goals of care: Advanced Directive information    09/22/2021    9:36 AM  Advanced Directives  Does Patient Have a Medical Advance Directive? Yes  Type of Advance Directive Out of facility DNR (pink MOST or yellow form)  Does patient want to make changes to medical advance directive? No - Patient declined  Pre-existing out of facility DNR order (yellow form or pink MOST form) Pink MOST form placed in chart (order not valid for inpatient use)     Chief Complaint  Patient presents with   Medical Management of Chronic Issues    6 month follow-up and foot exam. Discuss need for shingrix and additional covid booster or post pone if patient refuses. [facility]IR verified. Patient denies receiving any vaccines since last visit.      HPI:  Pt is a 71 y.o. female seen today for 6 months follow up for  medical management of chronic diseases.She denies any acute issues.  Has 5 lbs weight loss since last visit.   Type 2 DM - CBG ranging in the 80's -100's tries to watch the foods she eats.  Has upcoming appointment with ophthalmology next month.  Reports no numbness or tingling on the feet or hands Osteoarthritis -pain was on right knee.  Has had cortisone injection in the past but states does not want any more injection.  She uses over the counter topical analgesics which have been effective.  Hypothyroidism - take levothyroxine on empty stomach and waits for one hour before eating.   Health maintenance: Due for Shingrix  and COVID booster vaccine.  Advised to get vaccine at her pharmacy   Past Medical History:  Diagnosis Date   Arthritis    Breast cyst 3cm L breast   Colonic polyp    Diabetes mellitus type 2    Diverticulosis    Fatty liver    H/O bone density study 2020   H/O mammogram 2022   Hyperlipidemia small particles 6/06   Hypertension    Menopause 04/11/2000   Osteopenia L hip   Unspecified hypothyroidism 09/09/2010   Past Surgical History:  Procedure Laterality Date   BREAST BIOPSY  7/06 L breast benign   CESAREAN SECTION     COLONOSCOPY  6/05, 09/2008, 11/2013   Dr. Ganem    No Known Allergies  Allergies as of 09/22/2021   No Known Allergies      Medication List        Accurate as of September 22, 2021  9:42 AM. If you have any questions, ask your nurse or doctor.          atorvastatin 80 MG tablet Commonly known as: LIPITOR TAKE 1 TABLET(80 MG) BY MOUTH DAILY   CALCIUM 600 + D PO Take 1 tablet by mouth 2 (two) times daily.   fish oil-omega-3 fatty acids 1000 MG capsule Take 1 g by mouth daily.   levothyroxine 75 MCG tablet Commonly known as: SYNTHROID TAKE 1 TABLET(75 MCG) BY MOUTH DAILY BEFORE AND BREAKFAST   lisinopril 20 MG   tablet Commonly known as: ZESTRIL TAKE 1 TABLET(20 MG) BY MOUTH DAILY   metFORMIN 1000 MG tablet Commonly known as: GLUCOPHAGE TAKE 1 TABLET(1000 MG) BY MOUTH TWICE DAILY WITH A MEAL   multivitamin with minerals tablet Take 1 tablet by mouth daily.        Review of Systems  Constitutional:  Negative for appetite change, chills, fatigue, fever and unexpected weight change.  HENT:  Negative for congestion, dental problem, ear discharge, ear pain, facial swelling, hearing loss, nosebleeds, postnasal drip, rhinorrhea, sinus pressure, sinus pain, sneezing, sore throat, tinnitus and trouble swallowing.   Eyes:  Negative for pain, discharge, redness, itching and visual disturbance.  Respiratory:  Negative for cough, chest tightness,  shortness of breath and wheezing.   Cardiovascular:  Negative for chest pain, palpitations and leg swelling.  Gastrointestinal:  Negative for abdominal distention, abdominal pain, blood in stool, constipation, diarrhea, nausea and vomiting.  Endocrine: Negative for cold intolerance, heat intolerance, polydipsia, polyphagia and polyuria.  Genitourinary:  Negative for difficulty urinating, dysuria, flank pain, frequency and urgency.  Musculoskeletal:  Negative for arthralgias, back pain, gait problem, joint swelling, myalgias, neck pain and neck stiffness.  Skin:  Negative for color change, pallor, rash and wound.  Neurological:  Negative for dizziness, syncope, speech difficulty, weakness, light-headedness, numbness and headaches.  Hematological:  Does not bruise/bleed easily.  Psychiatric/Behavioral:  Negative for agitation, behavioral problems, confusion, hallucinations, self-injury, sleep disturbance and suicidal ideas. The patient is nervous/anxious.        Keeps her mind motivated and stays busy.     Immunization History  Administered Date(s) Administered   Fluad Quad(high Dose 65+) 03/04/2019, 03/09/2020, 12/23/2020   Influenza Split 02/10/2012   Influenza, High Dose Seasonal PF 02/15/2017, 02/02/2018   Influenza-Unspecified 01/13/2015, 02/01/2016   PFIZER(Purple Top)SARS-COV-2 Vaccination 06/07/2019, 07/02/2019, 02/08/2020   Pfizer Covid-19 Vaccine Bivalent Booster 12yrs & up 01/07/2021   Pneumococcal Conjugate-13 02/25/2016   Pneumococcal Polysaccharide-23 05/04/2007, 03/01/2017   Td 06/17/1997   Tdap 05/04/2007, 12/17/2012   Zoster, Live 12/30/2013   Pertinent  Health Maintenance Due  Topic Date Due   FOOT EXAM  03/23/2021   HEMOGLOBIN A1C  09/28/2021   OPHTHALMOLOGY EXAM  10/23/2021   INFLUENZA VACCINE  11/09/2021   MAMMOGRAM  02/09/2023   COLONOSCOPY (Pts 45-49yrs Insurance coverage will need to be confirmed)  03/27/2024   DEXA SCAN  Completed      10/23/2019    9:57  AM 03/23/2020    8:47 AM 12/23/2020   11:10 AM 03/22/2021   10:42 AM 09/22/2021    9:36 AM  Fall Risk  Falls in the past year? 0 0 0 0 0  Was there an injury with Fall?  0 0 0 0  Fall Risk Category Calculator  0 0 0 0  Fall Risk Category  Low Low Low Low  Patient Fall Risk Level Low fall risk  Low fall risk Low fall risk Low fall risk  Patient at Risk for Falls Due to   No Fall Risks No Fall Risks No Fall Risks  Fall risk Follow up   Falls evaluation completed Falls evaluation completed Falls evaluation completed   Functional Status Survey:    Vitals:   09/22/21 0937  BP: 130/84  Pulse: 95  Temp: (!) 97.4 F (36.3 C)  TempSrc: Temporal  SpO2: 99%  Weight: 90 lb 12.8 oz (41.2 kg)  Height: 5' 3" (1.6 m)   Body mass index is 16.08 kg/m. Physical Exam Vitals reviewed.  Constitutional:        General: She is not in acute distress.    Appearance: Normal appearance. She is underweight. She is not ill-appearing or diaphoretic.  HENT:     Head: Normocephalic.     Right Ear: Tympanic membrane, ear canal and external ear normal. There is no impacted cerumen.     Left Ear: Tympanic membrane, ear canal and external ear normal. There is no impacted cerumen.     Nose: Nose normal. No congestion or rhinorrhea.     Mouth/Throat:     Mouth: Mucous membranes are moist.     Pharynx: Oropharynx is clear. No oropharyngeal exudate or posterior oropharyngeal erythema.  Eyes:     General: No scleral icterus.       Right eye: No discharge.        Left eye: No discharge.     Extraocular Movements: Extraocular movements intact.     Conjunctiva/sclera: Conjunctivae normal.     Pupils: Pupils are equal, round, and reactive to light.  Neck:     Vascular: No carotid bruit.  Cardiovascular:     Rate and Rhythm: Normal rate and regular rhythm.     Pulses: Normal pulses.     Heart sounds: Normal heart sounds. No murmur heard.    No friction rub. No gallop.  Pulmonary:     Effort: Pulmonary effort  is normal. No respiratory distress.     Breath sounds: Normal breath sounds. No wheezing, rhonchi or rales.  Chest:     Chest wall: No tenderness.  Abdominal:     General: Bowel sounds are normal. There is no distension.     Palpations: Abdomen is soft. There is no mass.     Tenderness: There is no abdominal tenderness. There is no right CVA tenderness, left CVA tenderness, guarding or rebound.  Musculoskeletal:        General: No swelling or tenderness.     Cervical back: Normal range of motion. No rigidity or tenderness.     Right knee: Crepitus present. No swelling, effusion or erythema. Decreased range of motion. No tenderness.     Left knee: Normal.     Right lower leg: No edema.     Left lower leg: No edema.     Comments: Osteoarthritic changes on fingers  Lymphadenopathy:     Cervical: No cervical adenopathy.  Skin:    General: Skin is warm and dry.     Coloration: Skin is not pale.     Findings: No bruising, erythema, lesion or rash.  Neurological:     Mental Status: She is alert and oriented to person, place, and time.     Cranial Nerves: No cranial nerve deficit.     Sensory: No sensory deficit.     Motor: No weakness.     Coordination: Coordination normal.     Gait: Gait normal.  Psychiatric:        Mood and Affect: Mood normal.        Speech: Speech normal.        Behavior: Behavior normal.        Thought Content: Thought content normal.        Judgment: Judgment normal.     Labs reviewed: Recent Labs    03/30/21 1131  NA 142  K 4.0  CL 104  CO2 30  GLUCOSE 80  BUN 18  CREATININE 0.82  CALCIUM 9.4   Recent Labs    03/30/21 1131  AST 21  ALT 12  BILITOT 0.4  PROT 6.7     Recent Labs    03/30/21 1131  WBC 6.1  NEUTROABS 3,764  HGB 12.0  HCT 35.7  MCV 91.3  PLT 379   Lab Results  Component Value Date   TSH 1.20 03/30/2021   Lab Results  Component Value Date   HGBA1C 6.0 (H) 03/30/2021   Lab Results  Component Value Date   CHOL 145  03/30/2021   HDL 54 03/30/2021   LDLCALC 78 03/30/2021   TRIG 45 03/30/2021   CHOLHDL 2.7 03/30/2021    Significant Diagnostic Results in last 30 days:  No results found.  Assessment/Plan Problem List Items Addressed This Visit       Cardiovascular and Mediastinum   Essential hypertension, benign    - Blood pressure at goal -Previous creatinine within normal range  -Continue on lisinopril      Relevant Medications   aspirin EC 81 MG tablet   Other Relevant Orders   CBC with Differential/Platelet   CMP with eGFR([facility])     Endocrine   Hypothyroidism    Lab Results  Component Value Date   TSH 1.20 03/30/2021  -Continue on levothyroxine 75 mcg daily on empty stomach 2 hours away from the medication -We will recheck TSH today      Relevant Orders   TSH   DM type 2 with diabetic mixed hyperlipidemia (HCC) - Primary    Recalls CBGs at home ranges in the 80s to 100 -We will recheck hemoglobin A1c if within normal range reduce metformin due to ongoing weight loss.      Relevant Medications   aspirin EC 81 MG tablet   Other Relevant Orders   Hemoglobin A1c     Musculoskeletal and Integument   Primary osteoarthritis involving multiple joints    - Worst on right knee -Has had cortisone injection in the past but declines any more injections -Continue on over-the-counter topical analgesic      Relevant Medications   aspirin EC 81 MG tablet     Other   Mild protein-calorie malnutrition (HCC)    Checking CMP today.      Hyperlipidemia LDL goal <100    Previous LDL at goal -Continue on atorvastatin and fish oil -Reports no muscle aches or weakness      Relevant Medications   aspirin EC 81 MG tablet   Other Relevant Orders   Lipid Panel   Body mass index (BMI) of 19 or less in adult    BMI 16.08 -Reports appetite is good 3 meals per day -We will recheck hemoglobin A1c in reduce metformin levels are within normal range.  Suspect metformin contributing to her  weight loss.  We will also recheck her TSH and adjust levothyroxine if indicated.      Other Visit Diagnoses     Weight loss           6. Weight loss Has had 5 lbs weight loss since last visit. Suspect Metformin.will obtain A1C then adjust metformin if indicated.appetite described as good.   Family/ staff Communication: Reviewed plan of care with patient verbalized understanding  Labs/tests ordered:  - CBC with Differential/Platelet - CMP with eGFR([facility]) - TSH - Hgb A1C - Lipid panel   Next Appointment : Return in about 6 months (around 03/24/2022).   Dinah C Ngetich, NP   

## 2021-09-22 NOTE — Assessment & Plan Note (Signed)
BMI 16.08 -Reports appetite is good 3 meals per day -We will recheck hemoglobin A1c in reduce metformin levels are within normal range.  Suspect metformin contributing to her weight loss.  We will also recheck her TSH and adjust levothyroxine if indicated.

## 2021-09-22 NOTE — Assessment & Plan Note (Signed)
-   Worst on right knee -Has had cortisone injection in the past but declines any more injections -Continue on over-the-counter topical analgesic

## 2021-09-22 NOTE — Assessment & Plan Note (Signed)
Previous LDL at goal -Continue on atorvastatin and fish oil -Reports no muscle aches or weakness

## 2021-09-23 ENCOUNTER — Other Ambulatory Visit: Payer: Self-pay

## 2021-09-23 DIAGNOSIS — E1169 Type 2 diabetes mellitus with other specified complication: Secondary | ICD-10-CM

## 2021-09-23 LAB — COMPLETE METABOLIC PANEL WITH GFR
AG Ratio: 1.5 (calc) (ref 1.0–2.5)
ALT: 13 U/L (ref 6–29)
AST: 22 U/L (ref 10–35)
Albumin: 4.1 g/dL (ref 3.6–5.1)
Alkaline phosphatase (APISO): 49 U/L (ref 37–153)
BUN: 25 mg/dL (ref 7–25)
CO2: 30 mmol/L (ref 20–32)
Calcium: 10.4 mg/dL (ref 8.6–10.4)
Chloride: 101 mmol/L (ref 98–110)
Creat: 0.95 mg/dL (ref 0.60–1.00)
Globulin: 2.8 g/dL (calc) (ref 1.9–3.7)
Glucose, Bld: 85 mg/dL (ref 65–99)
Potassium: 4.3 mmol/L (ref 3.5–5.3)
Sodium: 141 mmol/L (ref 135–146)
Total Bilirubin: 0.4 mg/dL (ref 0.2–1.2)
Total Protein: 6.9 g/dL (ref 6.1–8.1)
eGFR: 64 mL/min/{1.73_m2} (ref >=60)

## 2021-09-23 LAB — CBC WITH DIFFERENTIAL/PLATELET
Absolute Monocytes: 407 cells/uL (ref 200–950)
Basophils Absolute: 41 cells/uL (ref 0–200)
Basophils Relative: 0.6 %
Eosinophils Absolute: 69 cells/uL (ref 15–500)
Eosinophils Relative: 1 %
HCT: 36.9 % (ref 35.0–45.0)
Hemoglobin: 12.7 g/dL (ref 11.7–15.5)
Lymphs Abs: 2029 cells/uL (ref 850–3900)
MCH: 30.6 pg (ref 27.0–33.0)
MCHC: 34.4 g/dL (ref 32.0–36.0)
MCV: 88.9 fL (ref 80.0–100.0)
MPV: 9.5 fL (ref 7.5–12.5)
Monocytes Relative: 5.9 %
Neutro Abs: 4354 cells/uL (ref 1500–7800)
Neutrophils Relative %: 63.1 %
Platelets: 326 10*3/uL (ref 140–400)
RBC: 4.15 10*6/uL (ref 3.80–5.10)
RDW: 12.9 % (ref 11.0–15.0)
Total Lymphocyte: 29.4 %
WBC: 6.9 10*3/uL (ref 3.8–10.8)

## 2021-09-23 LAB — LIPID PANEL
Cholesterol: 165 mg/dL (ref <200)
HDL: 58 mg/dL (ref >=50)
LDL Cholesterol (Calc): 93 mg/dL (calc)
Non-HDL Cholesterol (Calc): 107 mg/dL (calc) (ref <130)
Total CHOL/HDL Ratio: 2.8 (calc) (ref <5.0)
Triglycerides: 48 mg/dL (ref <150)

## 2021-09-23 LAB — HEMOGLOBIN A1C
Hgb A1c MFr Bld: 5.7 % of total Hgb — ABNORMAL HIGH (ref <5.7)
Mean Plasma Glucose: 117 mg/dL
eAG (mmol/L): 6.5 mmol/L

## 2021-09-23 LAB — TSH: TSH: 0.4 mIU/L (ref 0.40–4.50)

## 2021-09-23 MED ORDER — METFORMIN HCL 500 MG PO TABS: 500.0000 mg | ORAL_TABLET | Freq: Two times a day (BID) | ORAL | 3 refills | Status: DC

## 2021-10-11 DIAGNOSIS — H5203 Hypermetropia, bilateral: Secondary | ICD-10-CM | POA: Diagnosis not present

## 2021-10-11 DIAGNOSIS — H40033 Anatomical narrow angle, bilateral: Secondary | ICD-10-CM | POA: Diagnosis not present

## 2021-10-11 DIAGNOSIS — E119 Type 2 diabetes mellitus without complications: Secondary | ICD-10-CM | POA: Diagnosis not present

## 2021-10-11 DIAGNOSIS — H25813 Combined forms of age-related cataract, bilateral: Secondary | ICD-10-CM | POA: Diagnosis not present

## 2021-10-11 DIAGNOSIS — H40013 Open angle with borderline findings, low risk, bilateral: Secondary | ICD-10-CM | POA: Diagnosis not present

## 2021-10-11 LAB — HM DIABETES EYE EXAM

## 2021-11-06 ENCOUNTER — Other Ambulatory Visit: Payer: Self-pay | Admitting: Family

## 2021-11-06 DIAGNOSIS — I10 Essential (primary) hypertension: Secondary | ICD-10-CM

## 2021-11-22 DIAGNOSIS — H40011 Open angle with borderline findings, low risk, right eye: Secondary | ICD-10-CM | POA: Diagnosis not present

## 2021-12-08 DIAGNOSIS — H40013 Open angle with borderline findings, low risk, bilateral: Secondary | ICD-10-CM | POA: Diagnosis not present

## 2021-12-13 ENCOUNTER — Other Ambulatory Visit: Payer: Self-pay | Admitting: Family

## 2021-12-13 DIAGNOSIS — E1169 Type 2 diabetes mellitus with other specified complication: Secondary | ICD-10-CM

## 2021-12-13 DIAGNOSIS — E039 Hypothyroidism, unspecified: Secondary | ICD-10-CM

## 2021-12-15 ENCOUNTER — Telehealth: Payer: Self-pay

## 2021-12-15 NOTE — Telephone Encounter (Signed)
Patient walked in stating she went to the pharmacy to pick up her rx for metformin and was told we have not approved it.  After checking patients record I concluded that rx was approved in June 2023 for a year supply.  I called patients pharmacy (while patient was present) and she was able to hear from Merrilee Seashore Chartered loss adjuster) that rx was on file with refills (received in June 2023) and he would get rx ready for pick-up for the patient.   Patient states she is not sure why they told her that we had not approved it. Patient was planning on going back to the pharmacy.

## 2022-02-01 ENCOUNTER — Other Ambulatory Visit: Payer: Self-pay | Admitting: Family Medicine

## 2022-02-01 DIAGNOSIS — E7800 Pure hypercholesterolemia, unspecified: Secondary | ICD-10-CM

## 2022-02-09 DIAGNOSIS — M85852 Other specified disorders of bone density and structure, left thigh: Secondary | ICD-10-CM | POA: Diagnosis not present

## 2022-02-09 DIAGNOSIS — M85851 Other specified disorders of bone density and structure, right thigh: Secondary | ICD-10-CM | POA: Diagnosis not present

## 2022-02-09 DIAGNOSIS — Z1231 Encounter for screening mammogram for malignant neoplasm of breast: Secondary | ICD-10-CM | POA: Diagnosis not present

## 2022-02-10 ENCOUNTER — Telehealth: Payer: Self-pay | Admitting: Family

## 2022-02-10 NOTE — Telephone Encounter (Signed)
Bone density results received via fax indicates osteopenia ( weak bones).  Continue on calcium and vitamin D supplement.  Also recommend weightbearing exercises such as walking.  We will repeat bone density in 2 years

## 2022-02-10 NOTE — Telephone Encounter (Signed)
For future references. Please route to [place], if patient wasn't seen by [place] in office the day of results. Patient called and notified. Message routed back to PCP Ngetich, Nelda Bucks, NP as Juluis Rainier. No further action is required.

## 2022-02-10 NOTE — Telephone Encounter (Signed)
Sorry this was send under [facility] media report not under imaging or results so encounter is the only appropriate way to send.

## 2022-02-16 ENCOUNTER — Other Ambulatory Visit: Payer: Self-pay | Admitting: *Deleted

## 2022-02-16 ENCOUNTER — Other Ambulatory Visit: Payer: Self-pay | Admitting: Family

## 2022-02-16 DIAGNOSIS — E7800 Pure hypercholesterolemia, unspecified: Secondary | ICD-10-CM

## 2022-02-16 DIAGNOSIS — E1169 Type 2 diabetes mellitus with other specified complication: Secondary | ICD-10-CM

## 2022-02-16 MED ORDER — ATORVASTATIN CALCIUM 80 MG PO TABS: ORAL_TABLET | ORAL | 0 refills | Status: DC

## 2022-03-24 ENCOUNTER — Ambulatory Visit (INDEPENDENT_AMBULATORY_CARE_PROVIDER_SITE_OTHER): Payer: Medicare Other | Admitting: Family

## 2022-03-24 ENCOUNTER — Encounter: Payer: Self-pay | Admitting: Family

## 2022-03-24 VITALS — BP 110/80 | HR 78 | Temp 97.5°F | Resp 16 | Ht 63.0 in | Wt 95.6 lb

## 2022-03-24 DIAGNOSIS — E785 Hyperlipidemia, unspecified: Secondary | ICD-10-CM

## 2022-03-24 DIAGNOSIS — E1169 Type 2 diabetes mellitus with other specified complication: Secondary | ICD-10-CM

## 2022-03-24 DIAGNOSIS — I10 Essential (primary) hypertension: Secondary | ICD-10-CM

## 2022-03-24 DIAGNOSIS — E782 Mixed hyperlipidemia: Secondary | ICD-10-CM

## 2022-03-24 DIAGNOSIS — E039 Hypothyroidism, unspecified: Secondary | ICD-10-CM | POA: Diagnosis not present

## 2022-03-24 NOTE — Progress Notes (Signed)
Provider: Marlowe Sax FNP-C   Doree Kuehne, Nelda Bucks, NP  Patient Care Team: Sigrid Schwebach, Nelda Bucks, NP as PCP - General (Family Medicine)  Extended Emergency Contact Information Primary Emergency Contact: Chesterfield,Melvin Address: [place], [place] Phone: [phone] Relation: Spouse Secondary Emergency Contact: Macomber,[name]  [place] Phone: [phone] Relation: Daughter  Code Status:  Full Code  Goals of care: Advanced Directive information    03/24/2022    8:54 AM  Advanced Directives  Does Patient Have a Medical Advance Directive? No  Would patient like information on creating a medical advance directive? No - Patient declined     Chief Complaint  Patient presents with   Medical Management of Chronic Issues    6 month follow up.   Health Maintenance    Discuss the need for Urine Microalbumin, and Hemoglobin A1C.   Immunizations    Discuss the need for Shingrix vaccine, and Covid Booster.    HPI:  Pt is a 71 y.o. female seen today for 6 months follow up for medical management of chronic diseases.    Type 2 DM - CBG at home running in the 80's -100's.she watches whatever she eats.she denies any symptoms of hypoglycemia. Does exercise by moving around in the house on TV for seniors.Also parks her car away from the buildings. Follows up with Ophthalmologist has appointment next week due to pressure in the eye. No numbness or tingling on extremities.     Hypertension - she denies any headache,dizziness,vision changes,fatigue,chest tightness,palpitation,chest pain or shortness of breath. On ASA denies any signs bleeding.    Hypothyroidism - takes levothyroxine on empty stomach.An hour before other medication.  Hyperlipidemia - continues to watch what she eats.on Fish oil and Atorvastatin.she denies any muscle weakness or mylagia.   Osteopenia - Bone density done 02/08/2019.Does weight bearing  exercise by walking.On MVI and calcium supplement.  Health maintenance :  Due for Shingrix vaccine.   Past Medical History:  Diagnosis Date   Arthritis    Breast cyst 3cm L breast   Colonic polyp    Diabetes mellitus type 2    Diverticulosis    Fatty liver    H/O bone density study 2020   H/O mammogram 2022   Hyperlipidemia small particles 6/06   Hypertension    Menopause 04/11/2000   Osteopenia L hip   Unspecified hypothyroidism 09/09/2010   Past Surgical History:  Procedure Laterality Date   BREAST BIOPSY  7/06 L breast benign   CESAREAN SECTION     COLONOSCOPY  6/05, 09/2008, 11/2013   Dr. Penelope Coop    No Known Allergies  Allergies as of 03/24/2022   No Known Allergies      Medication List        Accurate as of March 24, 2022  9:00 AM. If you have any questions, ask your nurse or doctor.          aspirin EC 81 MG tablet Take 81 mg by mouth daily. Swallow whole.   atorvastatin 80 MG tablet Commonly known as: LIPITOR TAKE 1 TABLET(80 MG) BY MOUTH DAILY   CALCIUM 600 + D PO Take 1 tablet by mouth 2 (two) times daily.   fish oil-omega-3 fatty acids 1000 MG capsule Take 1 g by mouth daily.   levothyroxine 75 MCG tablet Commonly known as: SYNTHROID TAKE 1 TABLET(75 MCG) BY MOUTH DAILY BEFORE BREAKFAST  lisinopril 20 MG tablet Commonly known as: ZESTRIL TAKE 1 TABLET(20 MG) BY MOUTH DAILY   metFORMIN 500 MG tablet Commonly known as: GLUCOPHAGE Take 1 tablet (500 mg total) by mouth 2 (two) times daily with a meal.   multivitamin with minerals tablet Take 1 tablet by mouth daily.        Review of Systems  Constitutional:  Negative for appetite change, chills, fatigue, fever and unexpected weight change.  HENT:  Negative for congestion, dental problem, ear discharge, ear pain, facial swelling, hearing loss, nosebleeds, postnasal drip, rhinorrhea, sinus pressure, sinus pain, sneezing, sore throat, tinnitus and trouble swallowing.   Eyes:   Negative for pain, discharge, redness, itching and visual disturbance.  Respiratory:  Negative for cough, chest tightness, shortness of breath and wheezing.   Cardiovascular:  Negative for chest pain, palpitations and leg swelling.  Gastrointestinal:  Negative for abdominal distention, abdominal pain, blood in stool, constipation, diarrhea, nausea and vomiting.  Endocrine: Negative for cold intolerance, heat intolerance, polydipsia, polyphagia and polyuria.  Genitourinary:  Negative for difficulty urinating, dysuria, flank pain, frequency and urgency.  Musculoskeletal:  Negative for arthralgias, back pain, gait problem, joint swelling, myalgias, neck pain and neck stiffness.  Skin:  Negative for color change, pallor, rash and wound.  Neurological:  Negative for dizziness, syncope, speech difficulty, weakness, light-headedness, numbness and headaches.  Hematological:  Does not bruise/bleed easily.  Psychiatric/Behavioral:  Negative for agitation, behavioral problems, confusion, hallucinations, self-injury, sleep disturbance and suicidal ideas. The patient is not nervous/anxious.     Immunization History  Administered Date(s) Administered   Fluad Quad(high Dose 65+) 03/04/2019, 03/09/2020, 12/23/2020   Influenza Split 02/10/2012   Influenza, High Dose Seasonal PF 02/15/2017, 02/02/2018, 01/31/2022   Influenza-Unspecified 01/13/2015, 02/01/2016   PFIZER(Purple Top)SARS-COV-2 Vaccination 06/07/2019, 07/02/2019, 02/08/2020   Pfizer Covid-19 Vaccine Bivalent Booster 91yr & up 01/07/2021   Pneumococcal Conjugate-13 02/25/2016   Pneumococcal Polysaccharide-23 05/04/2007, 03/01/2017   Td 06/17/1997   Tdap 05/04/2007, 12/17/2012   Unspecified SARS-COV-2 Vaccination 01/31/2022   Zoster, Live 12/30/2013   Pertinent  Health Maintenance Due  Topic Date Due   HEMOGLOBIN A1C  03/24/2022   FOOT EXAM  09/23/2022   OPHTHALMOLOGY EXAM  10/12/2022   MAMMOGRAM  02/09/2023   COLONOSCOPY (Pts 45-457yr Insurance coverage will need to be confirmed)  03/27/2024   INFLUENZA VACCINE  Completed   DEXA SCAN  Completed      03/23/2020    8:47 AM 12/23/2020   11:10 AM 03/22/2021   10:42 AM 09/22/2021    9:36 AM 03/24/2022    8:53 AM  Fall Risk  Falls in the past year? 0 0 0 0 0  Was there an injury with Fall? 0 0 0 0 0  Fall Risk Category Calculator 0 0 0 0 0  Fall Risk Category _0   Patient Fall Risk Level  Low fall risk Low fall risk Low fall risk Low fall risk  Patient at Risk for Falls Due to  No Fall Risks No Fall Risks No Fall Risks No Fall Risks  Fall risk Follow up  Falls evaluation completed Falls evaluation completed Falls evaluation completed Falls evaluation completed   Functional Status Survey:    Vitals:   03/24/22 0846  BP: 110/80  Pulse: 78  Resp: 16  Temp: (!) 97.5 F (36.4 C)  SpO2: 99%  Weight: 95 lb 9.6 oz (43.4 kg)  Height: _1  (1.6 m)   Body mass index is 16.93 kg/m. Physical Exam  Vitals reviewed.  Constitutional:      General: She is not in acute distress.    Appearance: Normal appearance. She is normal weight. She is not ill-appearing or diaphoretic.  HENT:     Head: Normocephalic.     Right Ear: Tympanic membrane, ear canal and external ear normal. There is no impacted cerumen.     Left Ear: Tympanic membrane, ear canal and external ear normal. There is no impacted cerumen.     Nose: Nose normal. No congestion or rhinorrhea.     Mouth/Throat:     Mouth: Mucous membranes are moist.     Pharynx: Oropharynx is clear. No oropharyngeal exudate or posterior oropharyngeal erythema.  Eyes:     General: No scleral icterus.       Right eye: No discharge.        Left eye: No discharge.     Extraocular Movements: Extraocular movements intact.     Conjunctiva/sclera: Conjunctivae normal.     Pupils: Pupils are equal, round, and reactive to light.  Neck:     Vascular: No carotid bruit.  Cardiovascular:     Rate and Rhythm: Normal rate  and regular rhythm.     Pulses: Normal pulses.     Heart sounds: Normal heart sounds. No murmur heard.    No friction rub. No gallop.  Pulmonary:     Effort: Pulmonary effort is normal. No respiratory distress.     Breath sounds: Normal breath sounds. No wheezing, rhonchi or rales.  Chest:     Chest wall: No tenderness.  Abdominal:     General: Bowel sounds are normal. There is no distension.     Palpations: Abdomen is soft. There is no mass.     Tenderness: There is no abdominal tenderness. There is no right CVA tenderness, left CVA tenderness, guarding or rebound.  Musculoskeletal:        General: No swelling or tenderness. Normal range of motion.     Cervical back: Normal range of motion. No rigidity or tenderness.     Right lower leg: No edema.     Left lower leg: No edema.  Lymphadenopathy:     Cervical: No cervical adenopathy.  Skin:    General: Skin is warm and dry.     Coloration: Skin is not pale.     Findings: No bruising, erythema, lesion or rash.  Neurological:     Mental Status: She is alert and oriented to person, place, and time.     Cranial Nerves: No cranial nerve deficit.     Sensory: No sensory deficit.     Motor: No weakness.     Coordination: Coordination normal.     Gait: Gait normal.  Psychiatric:        Mood and Affect: Mood normal.        Speech: Speech normal.        Behavior: Behavior normal.        Thought Content: Thought content normal.        Judgment: Judgment normal.     Labs reviewed: Recent Labs    03/30/21 1131 09/22/21 1014  NA 142 141  K 4.0 4.3  CL 104 101  CO2 30 30  GLUCOSE 80 85  BUN 18 25  CREATININE 0.82 0.95  CALCIUM 9.4 10.4   Recent Labs    03/30/21 1131 09/22/21 1014  AST 21 22  ALT 12 13  BILITOT 0.4 0.4  PROT 6.7 6.9   Recent Labs  03/30/21 1131 09/22/21 1014  WBC 6.1 6.9  NEUTROABS 3,764 4,354  HGB 12.0 12.7  HCT 35.7 36.9  MCV 91.3 88.9  PLT 379 326   Lab Results  Component Value Date    TSH 0.40 09/22/2021   Lab Results  Component Value Date   HGBA1C 5.7 (H) 09/22/2021   Lab Results  Component Value Date   CHOL 165 09/22/2021   HDL 58 09/22/2021   LDLCALC 93 09/22/2021   TRIG 48 09/22/2021   CHOLHDL 2.8 09/22/2021    Significant Diagnostic Results in last 30 days:  No results found.  Assessment/Plan  1. DM type 2 with diabetic mixed hyperlipidemia ([place]) Lab Results  Component Value Date   HGBA1C 5.7 (H) 09/22/2021  -Continue on metformin - Hemoglobin A1c - - on ASA and Statin for Cardiovascular protection - continue on ACE inhibitor - Has appointment with ophthalmologist for Annual eye exam next week - Annual foot exam up to date - monofilament exam negative  - Microalbumin/Creatinine Ratio, Urine  2. Essential hypertension, benign Blood pressure well-controlled Continue on lisinopril - COMPLETE METABOLIC PANEL WITH GFR - CBC with Differential/Platelet  3. Acquired hypothyroidism Lab Results  Component Value Date   TSH 0.40 09/22/2021  -Continue level thyroxine 75 mcg on empty stomach at least 2 hours away from other medication. - TSH  4. Hyperlipidemia LDL goal <100 LDL at goal -Continue on atorvastatin and fish oil Dietary modification and exercise advised - Lipid panel  Family/ staff Communication: Reviewed plan of care with patient verbalized understanding  Labs/tests ordered: - CBC with Differential/Platelet - CMP with eGFR([facility]) - TSH - Hgb A1C - Lipid panel  Next Appointment : Return in about 6 months (around 09/23/2022) for medical mangement of chronic issues.Sandrea Hughs, NP

## 2022-03-24 NOTE — Patient Instructions (Signed)
Please contact your local pharmacy, previous provider, or insurance carrier for vaccine/immunization records. Ensure that any procedures done outside of [facility] are faxed to us [phone] or you can sign release of records form at the front desk to keep your medical record updated.   ?

## 2022-03-25 LAB — CBC WITH DIFFERENTIAL/PLATELET
Absolute Monocytes: 330 cells/uL (ref 200–950)
Basophils Absolute: 33 cells/uL (ref 0–200)
Basophils Relative: 0.5 %
Eosinophils Absolute: 92 cells/uL (ref 15–500)
Eosinophils Relative: 1.4 %
HCT: 38.7 % (ref 35.0–45.0)
Hemoglobin: 13 g/dL (ref 11.7–15.5)
Lymphs Abs: 2244 cells/uL (ref 850–3900)
MCH: 30.7 pg (ref 27.0–33.0)
MCHC: 33.6 g/dL (ref 32.0–36.0)
MCV: 91.5 fL (ref 80.0–100.0)
MPV: 9.4 fL (ref 7.5–12.5)
Monocytes Relative: 5 %
Neutro Abs: 3901 cells/uL (ref 1500–7800)
Neutrophils Relative %: 59.1 %
Platelets: 301 10*3/uL (ref 140–400)
RBC: 4.23 10*6/uL (ref 3.80–5.10)
RDW: 12.6 % (ref 11.0–15.0)
Total Lymphocyte: 34 %
WBC: 6.6 10*3/uL (ref 3.8–10.8)

## 2022-03-25 LAB — COMPLETE METABOLIC PANEL WITH GFR
AG Ratio: 1.4 (calc) (ref 1.0–2.5)
ALT: 15 U/L (ref 6–29)
AST: 24 U/L (ref 10–35)
Albumin: 4.2 g/dL (ref 3.6–5.1)
Alkaline phosphatase (APISO): 49 U/L (ref 37–153)
BUN/Creatinine Ratio: 23 (calc) — ABNORMAL HIGH (ref 6–22)
BUN: 23 mg/dL (ref 7–25)
CO2: 29 mmol/L (ref 20–32)
Calcium: 9.9 mg/dL (ref 8.6–10.4)
Chloride: 103 mmol/L (ref 98–110)
Creat: 1.02 mg/dL — ABNORMAL HIGH (ref 0.60–1.00)
Globulin: 2.9 g/dL (calc) (ref 1.9–3.7)
Glucose, Bld: 89 mg/dL (ref 65–99)
Potassium: 4.3 mmol/L (ref 3.5–5.3)
Sodium: 141 mmol/L (ref 135–146)
Total Bilirubin: 0.4 mg/dL (ref 0.2–1.2)
Total Protein: 7.1 g/dL (ref 6.1–8.1)
eGFR: 59 mL/min/{1.73_m2} — ABNORMAL LOW (ref >=60)

## 2022-03-25 LAB — HEMOGLOBIN A1C
Hgb A1c MFr Bld: 6.1 % of total Hgb — ABNORMAL HIGH (ref <5.7)
Mean Plasma Glucose: 128 mg/dL
eAG (mmol/L): 7.1 mmol/L

## 2022-03-25 LAB — LIPID PANEL
Cholesterol: 165 mg/dL (ref <200)
HDL: 69 mg/dL (ref >=50)
LDL Cholesterol (Calc): 83 mg/dL (calc)
Non-HDL Cholesterol (Calc): 96 mg/dL (calc) (ref <130)
Total CHOL/HDL Ratio: 2.4 (calc) (ref <5.0)
Triglycerides: 47 mg/dL (ref <150)

## 2022-03-25 LAB — MICROALBUMIN / CREATININE URINE RATIO
Creatinine, Urine: 35 mg/dL (ref 20–275)
Microalb, Ur: <0.2 mg/dL

## 2022-03-25 LAB — TSH: TSH: 0.65 mIU/L (ref 0.40–4.50)

## 2022-03-28 ENCOUNTER — Telehealth: Payer: Self-pay

## 2022-03-28 NOTE — Telephone Encounter (Signed)
Type 2 Diabetes affects the kidney continue to keep blood sugar and blood pressure under control.

## 2022-03-28 NOTE — Telephone Encounter (Signed)
Patient was advised and verbalized understanding. 

## 2022-03-28 NOTE — Telephone Encounter (Signed)
Patient called office back to ask if anything else could be making her kidney function elevated. She reports not taking any NSAID'S drugs and is very concerned about her kidney function been elevated. She would like to know what she could do to have the levels lowered and if her blood pressure medication could affect her kidneys. Please advise.

## 2022-05-07 ENCOUNTER — Other Ambulatory Visit: Payer: Self-pay | Admitting: Family

## 2022-05-07 DIAGNOSIS — I10 Essential (primary) hypertension: Secondary | ICD-10-CM

## 2022-05-10 DIAGNOSIS — H40013 Open angle with borderline findings, low risk, bilateral: Secondary | ICD-10-CM | POA: Diagnosis not present

## 2022-08-03 ENCOUNTER — Encounter: Payer: Medicare Other | Admitting: Family

## 2022-09-09 ENCOUNTER — Other Ambulatory Visit: Payer: Self-pay

## 2022-09-09 ENCOUNTER — Other Ambulatory Visit: Payer: Self-pay | Admitting: Family

## 2022-09-09 MED ORDER — METFORMIN HCL 500 MG PO TABS: 500.0000 mg | ORAL_TABLET | Freq: Two times a day (BID) | ORAL | 0 refills | Status: DC

## 2022-09-18 LAB — HEMOGLOBIN A1C: Hemoglobin A1C: 5.5

## 2022-09-26 ENCOUNTER — Ambulatory Visit (INDEPENDENT_AMBULATORY_CARE_PROVIDER_SITE_OTHER): Payer: Medicare Other | Admitting: Family

## 2022-09-26 ENCOUNTER — Encounter: Payer: Self-pay | Admitting: Family

## 2022-09-26 VITALS — BP 138/88 | HR 70 | Temp 97.6°F | Resp 20 | Ht 63.0 in | Wt 100.0 lb

## 2022-09-26 DIAGNOSIS — I10 Essential (primary) hypertension: Secondary | ICD-10-CM

## 2022-09-26 DIAGNOSIS — E782 Mixed hyperlipidemia: Secondary | ICD-10-CM | POA: Diagnosis not present

## 2022-09-26 DIAGNOSIS — E039 Hypothyroidism, unspecified: Secondary | ICD-10-CM | POA: Diagnosis not present

## 2022-09-26 DIAGNOSIS — E785 Hyperlipidemia, unspecified: Secondary | ICD-10-CM | POA: Diagnosis not present

## 2022-09-26 DIAGNOSIS — E1169 Type 2 diabetes mellitus with other specified complication: Secondary | ICD-10-CM | POA: Diagnosis not present

## 2022-09-26 DIAGNOSIS — M159 Polyosteoarthritis, unspecified: Secondary | ICD-10-CM | POA: Diagnosis not present

## 2022-09-26 NOTE — Progress Notes (Signed)
Provider: Richarda Blade FNP-C   Jasn Xia, Donalee Citrin, NP  Patient Care Team: Ritisha Deitrick, Donalee Citrin, NP as PCP - General (Family Medicine)  Extended Emergency Contact Information Primary Emergency Contact: Bautch,Melvin Address: [address] [place], [place] Home Phone: [phone] Relation: Spouse Secondary Emergency Contact: Bayus,[name]  [place] Home Phone: [phone] Relation: Daughter  Code Status:  Full Code  Goals of care: Advanced Directive information    03/24/2022    8:54 AM  Advanced Directives  Does Patient Have a Medical Advance Directive? No  Would patient like information on creating a medical advance directive? No - Patient declined     Chief Complaint  Patient presents with   Medical Management of Chronic Issues    Patient presents today for a 6 month follow-up   Quality Metric Gaps    A1C, zoster, COVID#6    HPI:  Pt is a 72 y.o. female seen today for 6 months follow up for medical management of chronic diseases.   Type 2 DM  - states trying to et healthy.eats sugar free desert. previous A1C 6.1 Does exercise by walking with daughter daily for 30 minutes.  States CBG runs in the 80's -100's.denies any signs of hypoglycemia or hyperglycemia.  States no numbness or tingling of extremities. Also up to date with annual eye and foot exam.   CKD stage 3 a - previous 1.02 not on NSAID's   Hypothyroidism - levothyroxine 75 mcg tablet  Hyperlipidemia - on Lipitor and Fish oil states trying to eat healthy.  Osteoarthritis  - Takes tylenol as needed.walking has helped.walks with daughter 30 minutes daily.   States  appetite described as good. No fall episode. Has had no hospitalization.   Immunization up to date except shingles and COVID-19 vaccine.Aware to get at the pharmacy  Past Medical History:  Diagnosis Date   Arthritis    Breast cyst 3cm L breast   Colonic polyp    Diabetes mellitus  type 2    Diverticulosis    Fatty liver    H/O bone density study 2020   H/O mammogram 2022   Hyperlipidemia small particles 6/06   Hypertension    Menopause 04/11/2000   Osteopenia L hip   Unspecified hypothyroidism 09/09/2010   Past Surgical History:  Procedure Laterality Date   BREAST BIOPSY  7/06 L breast benign   CESAREAN SECTION     COLONOSCOPY  6/05, 09/2008, 11/2013   Dr. Evette Cristal    No Known Allergies  Allergies as of 09/26/2022   No Known Allergies      Medication List        Accurate as of September 26, 2022  9:02 AM. If you have any questions, ask your nurse or doctor.          aspirin EC 81 MG tablet Take 81 mg by mouth daily. Swallow whole.   atorvastatin 80 MG tablet Commonly known as: LIPITOR TAKE 1 TABLET(80 MG) BY MOUTH DAILY   CALCIUM 600 + D PO Take 1 tablet by mouth 2 (two) times daily.   fish oil-omega-3 fatty acids 1000 MG capsule Take 1 g by mouth daily.   levothyroxine 75 MCG tablet Commonly known as: SYNTHROID TAKE 1 TABLET(75 MCG) BY MOUTH DAILY BEFORE BREAKFAST   lisinopril 20 MG tablet Commonly known as: ZESTRIL TAKE 1 TABLET(20 MG) BY MOUTH DAILY   metFORMIN 500 MG tablet Commonly known  as: GLUCOPHAGE Take 1 tablet (500 mg total) by mouth 2 (two) times daily with a meal.   multivitamin with minerals tablet Take 1 tablet by mouth daily.        Review of Systems  Constitutional:  Negative for appetite change, chills, fatigue, fever and unexpected weight change.  HENT:  Negative for congestion, dental problem, ear discharge, ear pain, facial swelling, hearing loss, nosebleeds, postnasal drip, rhinorrhea, sinus pressure, sinus pain, sneezing, sore throat, tinnitus and trouble swallowing.   Eyes:  Negative for pain, discharge, redness, itching and visual disturbance.  Respiratory:  Negative for cough, chest tightness, shortness of breath and wheezing.   Cardiovascular:  Negative for chest pain, palpitations and leg swelling.   Gastrointestinal:  Negative for abdominal distention, abdominal pain, blood in stool, constipation, diarrhea, nausea and vomiting.  Endocrine: Negative for cold intolerance, heat intolerance, polydipsia, polyphagia and polyuria.  Genitourinary:  Negative for difficulty urinating, dysuria, flank pain, frequency and urgency.  Musculoskeletal:  Negative for arthralgias, back pain, gait problem, joint swelling, myalgias, neck pain and neck stiffness.  Skin:  Negative for color change, pallor, rash and wound.  Neurological:  Negative for dizziness, syncope, speech difficulty, weakness, light-headedness, numbness and headaches.  Hematological:  Does not bruise/bleed easily.  Psychiatric/Behavioral:  Negative for agitation, behavioral problems, confusion, hallucinations, self-injury, sleep disturbance and suicidal ideas. The patient is not nervous/anxious.     Immunization History  Administered Date(s) Administered   Fluad Quad(high Dose 65+) 03/04/2019, 03/09/2020, 12/23/2020   Influenza Split 02/10/2012   Influenza, High Dose Seasonal PF 02/15/2017, 02/02/2018, 01/31/2022   Influenza-Unspecified 01/13/2015, 02/01/2016   PFIZER(Purple Top)SARS-COV-2 Vaccination 06/07/2019, 07/02/2019, 02/08/2020   Pfizer Covid-19 Vaccine Bivalent Booster 2yrs & up 01/07/2021   Pneumococcal Conjugate-13 02/25/2016   Pneumococcal Polysaccharide-23 05/04/2007, 03/01/2017   Td 06/17/1997   Tdap 05/04/2007, 12/17/2012   Unspecified SARS-COV-2 Vaccination 01/31/2022   Zoster, Live 12/30/2013   Pertinent  Health Maintenance Due  Topic Date Due   HEMOGLOBIN A1C  09/23/2022   OPHTHALMOLOGY EXAM  10/12/2022   INFLUENZA VACCINE  11/10/2022   MAMMOGRAM  02/09/2023   FOOT EXAM  03/25/2023   Colonoscopy  03/27/2024   DEXA SCAN  Completed      12/23/2020   11:10 AM 03/22/2021   10:42 AM 09/22/2021    9:36 AM 03/24/2022    8:53 AM 09/26/2022    8:57 AM  Fall Risk  Falls in the past year? 0 0 0 0 0  Was there an  injury with Fall? 0 0 0 0 0  Fall Risk Category Calculator 0 0 0 0 0  Fall Risk Category (Retired) Low Low Low Low   (RETIRED) Patient Fall Risk Level Low fall risk Low fall risk Low fall risk Low fall risk   Patient at Risk for Falls Due to No Fall Risks No Fall Risks No Fall Risks No Fall Risks No Fall Risks  Fall risk Follow up Falls evaluation completed Falls evaluation completed Falls evaluation completed Falls evaluation completed Falls evaluation completed   Functional Status Survey:    Vitals:   09/26/22 0851  BP: 138/88  Pulse: 70  Resp: 20  Temp: 97.6 F (36.4 C)  SpO2: 99%  Weight: 100 lb (45.4 kg)  Height: 5\' 3"  (1.6 m)   Body mass index is 17.71 kg/m. Physical Exam Vitals reviewed.  Constitutional:      General: She is not in acute distress.    Appearance: Normal appearance. She is underweight. She is not ill-appearing  or diaphoretic.  HENT:     Head: Normocephalic.     Right Ear: Tympanic membrane, ear canal and external ear normal. There is no impacted cerumen.     Left Ear: Tympanic membrane, ear canal and external ear normal. There is no impacted cerumen.     Nose: Nose normal. No congestion or rhinorrhea.     Mouth/Throat:     Mouth: Mucous membranes are moist.     Pharynx: Oropharynx is clear. No oropharyngeal exudate or posterior oropharyngeal erythema.  Eyes:     General: No scleral icterus.       Right eye: No discharge.        Left eye: No discharge.     Extraocular Movements: Extraocular movements intact.     Conjunctiva/sclera: Conjunctivae normal.     Pupils: Pupils are equal, round, and reactive to light.  Neck:     Vascular: No carotid bruit.  Cardiovascular:     Rate and Rhythm: Normal rate and regular rhythm.     Pulses: Normal pulses.     Heart sounds: Normal heart sounds. No murmur heard.    No friction rub. No gallop.  Pulmonary:     Effort: Pulmonary effort is normal. No respiratory distress.     Breath sounds: Normal breath  sounds. No wheezing, rhonchi or rales.  Chest:     Chest wall: No tenderness.  Abdominal:     General: Bowel sounds are normal. There is no distension.     Palpations: Abdomen is soft. There is no mass.     Tenderness: There is no abdominal tenderness. There is no right CVA tenderness, left CVA tenderness, guarding or rebound.  Musculoskeletal:        General: No swelling or tenderness. Normal range of motion.     Cervical back: Normal range of motion. No rigidity or tenderness.     Right lower leg: No edema.     Left lower leg: No edema.  Lymphadenopathy:     Cervical: No cervical adenopathy.  Skin:    General: Skin is warm and dry.     Coloration: Skin is not pale.     Findings: No bruising, erythema, lesion or rash.  Neurological:     Mental Status: She is alert and oriented to person, place, and time.     Cranial Nerves: No cranial nerve deficit.     Sensory: No sensory deficit.     Motor: No weakness.     Coordination: Coordination normal.     Gait: Gait normal.  Psychiatric:        Mood and Affect: Mood normal.        Speech: Speech normal.        Behavior: Behavior normal.        Thought Content: Thought content normal.        Judgment: Judgment normal.     Labs reviewed: Recent Labs    03/24/22 0934  NA 141  K 4.3  CL 103  CO2 29  GLUCOSE 89  BUN 23  CREATININE 1.02*  CALCIUM 9.9   Recent Labs    03/24/22 0934  AST 24  ALT 15  BILITOT 0.4  PROT 7.1   Recent Labs    03/24/22 0934  WBC 6.6  NEUTROABS 3,901  HGB 13.0  HCT 38.7  MCV 91.5  PLT 301   Lab Results  Component Value Date   TSH 0.65 03/24/2022   Lab Results  Component Value Date   HGBA1C  6.1 (H) 03/24/2022   Lab Results  Component Value Date   CHOL 165 03/24/2022   HDL 69 03/24/2022   LDLCALC 83 03/24/2022   TRIG 47 03/24/2022   CHOLHDL 2.4 03/24/2022    Significant Diagnostic Results in last 30 days:  No results found.  Assessment/Plan  1. Essential hypertension,  benign Blood pressure well-controlled -Continue on lisinopril -Continue dietary modification and exercise - COMPLETE METABOLIC PANEL WITH GFR - CBC with Differential/Platelet  2. Acquired hypothyroidism Lab Results  Component Value Date   TSH 0.65 03/24/2022  -Continue on levothyroxine 75 mcg on empty stomach - TSH  3. Hyperlipidemia LDL goal <100 Previous LDL at goal -Continue on atorvastatin and fish oil -Dietary modification and exercise advised - Lipid panel  4. DM type 2 with diabetic mixed hyperlipidemia ([facility]) Lab Results  Component Value Date   HGBA1C 6.1 (H) 03/24/2022  -Continue on metformin -Continue dietary modification and exercise -Up-to-date on annual eye and foot exam -No neuropathy reported -Continue on ACE inhibitor -Continue on aspirin and atorvastatin for cardiovascular event prevention -Immunization up to date except shingles and COVID-19 vaccine.Aware to get at the pharmacy  - COMPLETE METABOLIC PANEL WITH GFR - CBC with Differential/Platelet  5. Primary osteoarthritis involving multiple joints Arthritis changes on fingers.  Has knee pain -Continue on over-the-counter extra strength Tylenol as needed for pain  Family/ staff Communication: Reviewed plan of care with patient verbalized understanding  Labs/tests ordered:  - CBC with Differential/Platelet - CMP with eGFR([facility]) - TSH - Lipid panel  Next Appointment : Return in about 6 months (around 03/28/2023) for medical mangement of chronic issues.Caesar Bookman, NP

## 2022-09-27 LAB — LIPID PANEL
Cholesterol: 165 mg/dL (ref <200)
HDL: 64 mg/dL (ref >=50)
LDL Cholesterol (Calc): 87 mg/dL (calc)
Non-HDL Cholesterol (Calc): 101 mg/dL (calc) (ref <130)
Total CHOL/HDL Ratio: 2.6 (calc) (ref <5.0)
Triglycerides: 57 mg/dL (ref <150)

## 2022-09-27 LAB — CBC WITH DIFFERENTIAL/PLATELET
Absolute Monocytes: 426 cells/uL (ref 200–950)
Basophils Absolute: 21 cells/uL (ref 0–200)
Basophils Relative: 0.3 %
Eosinophils Absolute: 78 cells/uL (ref 15–500)
Eosinophils Relative: 1.1 %
HCT: 39.8 % (ref 35.0–45.0)
Hemoglobin: 13 g/dL (ref 11.7–15.5)
Lymphs Abs: 1924 cells/uL (ref 850–3900)
MCH: 30.7 pg (ref 27.0–33.0)
MCHC: 32.7 g/dL (ref 32.0–36.0)
MCV: 94.1 fL (ref 80.0–100.0)
MPV: 9 fL (ref 7.5–12.5)
Monocytes Relative: 6 %
Neutro Abs: 4651 cells/uL (ref 1500–7800)
Neutrophils Relative %: 65.5 %
Platelets: 374 10*3/uL (ref 140–400)
RBC: 4.23 10*6/uL (ref 3.80–5.10)
RDW: 12.6 % (ref 11.0–15.0)
Total Lymphocyte: 27.1 %
WBC: 7.1 10*3/uL (ref 3.8–10.8)

## 2022-09-27 LAB — COMPLETE METABOLIC PANEL WITH GFR
AG Ratio: 1.4 (calc) (ref 1.0–2.5)
ALT: 15 U/L (ref 6–29)
AST: 22 U/L (ref 10–35)
Albumin: 4.2 g/dL (ref 3.6–5.1)
Alkaline phosphatase (APISO): 53 U/L (ref 37–153)
BUN: 21 mg/dL (ref 7–25)
CO2: 30 mmol/L (ref 20–32)
Calcium: 10.4 mg/dL (ref 8.6–10.4)
Chloride: 103 mmol/L (ref 98–110)
Creat: 1 mg/dL (ref 0.60–1.00)
Globulin: 3 g/dL (calc) (ref 1.9–3.7)
Glucose, Bld: 88 mg/dL (ref 65–99)
Potassium: 4.3 mmol/L (ref 3.5–5.3)
Sodium: 141 mmol/L (ref 135–146)
Total Bilirubin: 0.4 mg/dL (ref 0.2–1.2)
Total Protein: 7.2 g/dL (ref 6.1–8.1)
eGFR: 60 mL/min/{1.73_m2} (ref >=60)

## 2022-09-27 LAB — TSH: TSH: 2.94 mIU/L (ref 0.40–4.50)

## 2022-10-07 ENCOUNTER — Other Ambulatory Visit: Payer: Self-pay | Admitting: Family

## 2022-10-07 NOTE — Telephone Encounter (Signed)
Patient last given 30 day supply.

## 2022-10-14 ENCOUNTER — Other Ambulatory Visit: Payer: Self-pay

## 2022-10-14 DIAGNOSIS — E039 Hypothyroidism, unspecified: Secondary | ICD-10-CM

## 2022-10-14 DIAGNOSIS — E7800 Pure hypercholesterolemia, unspecified: Secondary | ICD-10-CM

## 2022-10-14 DIAGNOSIS — I10 Essential (primary) hypertension: Secondary | ICD-10-CM

## 2022-10-14 MED ORDER — ATORVASTATIN CALCIUM 80 MG PO TABS
ORAL_TABLET | ORAL | 1 refills | Status: DC
Start: 2022-10-14 — End: 2022-11-10

## 2022-10-14 MED ORDER — LEVOTHYROXINE SODIUM 75 MCG PO TABS
ORAL_TABLET | ORAL | 1 refills | Status: DC
Start: 2022-10-14 — End: 2023-05-16

## 2022-10-14 MED ORDER — LISINOPRIL 20 MG PO TABS: ORAL_TABLET | ORAL | 1 refills | Status: DC

## 2022-11-10 ENCOUNTER — Other Ambulatory Visit: Payer: Self-pay | Admitting: Family

## 2022-11-10 DIAGNOSIS — H524 Presbyopia: Secondary | ICD-10-CM | POA: Diagnosis not present

## 2022-11-10 DIAGNOSIS — H40013 Open angle with borderline findings, low risk, bilateral: Secondary | ICD-10-CM | POA: Diagnosis not present

## 2022-11-10 DIAGNOSIS — H40033 Anatomical narrow angle, bilateral: Secondary | ICD-10-CM | POA: Diagnosis not present

## 2022-11-10 DIAGNOSIS — E7800 Pure hypercholesterolemia, unspecified: Secondary | ICD-10-CM

## 2022-11-10 DIAGNOSIS — E119 Type 2 diabetes mellitus without complications: Secondary | ICD-10-CM | POA: Diagnosis not present

## 2022-11-10 DIAGNOSIS — H25813 Combined forms of age-related cataract, bilateral: Secondary | ICD-10-CM | POA: Diagnosis not present

## 2022-11-10 LAB — HM DIABETES EYE EXAM

## 2023-01-03 ENCOUNTER — Other Ambulatory Visit: Payer: Self-pay | Admitting: Nurse Practitioner

## 2023-02-13 DIAGNOSIS — Z1231 Encounter for screening mammogram for malignant neoplasm of breast: Secondary | ICD-10-CM | POA: Diagnosis not present

## 2023-02-13 LAB — HM MAMMOGRAPHY

## 2023-03-28 ENCOUNTER — Ambulatory Visit (INDEPENDENT_AMBULATORY_CARE_PROVIDER_SITE_OTHER): Payer: Medicare Other | Admitting: Family

## 2023-03-28 ENCOUNTER — Encounter: Payer: Self-pay | Admitting: Family

## 2023-03-28 VITALS — BP 130/80 | HR 85 | Temp 97.8°F | Resp 18 | Ht 63.0 in | Wt 99.2 lb

## 2023-03-28 DIAGNOSIS — L600 Ingrowing nail: Secondary | ICD-10-CM

## 2023-03-28 DIAGNOSIS — E441 Mild protein-calorie malnutrition: Secondary | ICD-10-CM

## 2023-03-28 DIAGNOSIS — L602 Onychogryphosis: Secondary | ICD-10-CM

## 2023-03-28 DIAGNOSIS — M150 Primary generalized (osteo)arthritis: Secondary | ICD-10-CM

## 2023-03-28 DIAGNOSIS — E782 Mixed hyperlipidemia: Secondary | ICD-10-CM | POA: Diagnosis not present

## 2023-03-28 DIAGNOSIS — E785 Hyperlipidemia, unspecified: Secondary | ICD-10-CM

## 2023-03-28 DIAGNOSIS — E1169 Type 2 diabetes mellitus with other specified complication: Secondary | ICD-10-CM | POA: Diagnosis not present

## 2023-03-28 DIAGNOSIS — E039 Hypothyroidism, unspecified: Secondary | ICD-10-CM | POA: Diagnosis not present

## 2023-03-28 DIAGNOSIS — I10 Essential (primary) hypertension: Secondary | ICD-10-CM | POA: Diagnosis not present

## 2023-03-28 NOTE — Progress Notes (Signed)
Provider: Richarda Blade FNP-C   Karysa Heft, Donalee Citrin, NP  Patient Care Team: Deeric Cruise, Donalee Citrin, NP as PCP - General (Family Medicine) Mateo Flow, MD as Consulting Physician (Ophthalmology)  Extended Emergency Contact Information Primary Emergency Contact: Erven Colla Address: [address] [place], [place] Home Phone: [phone] Relation: Spouse Secondary Emergency Contact: Fitzgibbons,[name]  [place] Home Phone: [phone] Relation: Daughter  Code Status:  Full Code  Goals of care: Advanced Directive information    03/28/2023    8:43 AM  Advanced Directives  Does Patient Have a Medical Advance Directive? Yes  Type of Advance Directive Living will  Does patient want to make changes to medical advance directive? No - Patient declined     Chief Complaint  Patient presents with   Medical Management of Chronic Issues    6 month follow up and discuss foot exam,hemoglobin A1C,medicare annual wellness visit ,shingles,tdap,covid,and flu vaccines.    HPI:  Pt is a 72 y.o. female seen today for 6 months follow up for medical management of chronic diseases. She denies any acute issues.  Has had one pound weight loss since last visit.appetite described as good. No fall episode or hospital visit since last seen.   Hypertension - checks B/p sometimes.Has been staying away from salt. She denies any headache,dizziness,vision changes,fatigue,chest tightness,palpitation,chest pain or shortness of breath.      Hypothyroidism - on levothyroxine takes on empty stomach.states stays cold all the time.  Type 2 DM - stopped sweets.on metformin.CBG runs in 90's -100's.Potatoes tends  to make it run in the 100's. Request another referral to podiatrist states did not go on previous visit. Annual eye exam up to date follows up with [facility].states retinopathy just has " Baby Cataract " has follow up appointment 05/16/2023    Osteoarthritis - worst on the hands.No difficulty opening tops. Has not required any analgesic.    Hyperlipidemia - on fish oil. Watches what she eats.exercise by walking.   Has not gone to the dentist but plans to go. States bill was high after tooth extraction.   Immunization - Due for Shingles, Dtap and COVID-19    Past Medical History:  Diagnosis Date   Arthritis    Breast cyst 3cm L breast   Colonic polyp    Diabetes mellitus type 2    Diverticulosis    Fatty liver    H/O bone density study 2020   H/O mammogram 2022   Hyperlipidemia small particles 6/06   Hypertension    Menopause 04/11/2000   Osteopenia L hip   Unspecified hypothyroidism 09/09/2010   Past Surgical History:  Procedure Laterality Date   BREAST BIOPSY  7/06 L breast benign   CESAREAN SECTION     COLONOSCOPY  6/05, 09/2008, 11/2013   Dr. Evette Cristal    No Known Allergies  Allergies as of 03/28/2023   No Known Allergies      Medication List        Accurate as of March 28, 2023  9:00 AM. If you have any questions, ask your nurse or doctor.          aspirin EC 81 MG tablet Take 81 mg by mouth daily. Swallow whole.   atorvastatin 80 MG tablet Commonly known as: LIPITOR TAKE 1 TABLET(80 MG) BY MOUTH DAILY   CALCIUM 600 + D PO Take 1 tablet by mouth 2 (two) times daily.  fish oil-omega-3 fatty acids 1000 MG capsule Take 1 g by mouth daily.   levothyroxine 75 MCG tablet Commonly known as: SYNTHROID TAKE 1 TABLET(75 MCG) BY MOUTH DAILY BEFORE BREAKFAST   lisinopril 20 MG tablet Commonly known as: ZESTRIL TAKE 1 TABLET(20 MG) BY MOUTH DAILY   metFORMIN 500 MG tablet Commonly known as: GLUCOPHAGE TAKE 1 TABLET(500 MG) BY MOUTH TWICE DAILY WITH A MEAL   multivitamin with minerals tablet Take 1 tablet by mouth daily.        Review of Systems  Constitutional:  Negative for appetite change, chills, fatigue, fever and unexpected weight change.  HENT:  Negative for congestion,  dental problem, ear discharge, ear pain, facial swelling, hearing loss, nosebleeds, postnasal drip, rhinorrhea, sinus pressure, sinus pain, sneezing, sore throat, tinnitus and trouble swallowing.   Eyes:  Negative for pain, discharge, redness, itching and visual disturbance.  Respiratory:  Negative for cough, chest tightness, shortness of breath and wheezing.   Cardiovascular:  Negative for chest pain, palpitations and leg swelling.  Gastrointestinal:  Negative for abdominal distention, abdominal pain, blood in stool, constipation, diarrhea, nausea and vomiting.  Endocrine: Negative for cold intolerance, heat intolerance, polydipsia, polyphagia and polyuria.  Genitourinary:  Negative for difficulty urinating, dysuria, flank pain, frequency and urgency.  Musculoskeletal:  Negative for arthralgias, back pain, gait problem, joint swelling, myalgias, neck pain and neck stiffness.  Skin:  Negative for color change, pallor, rash and wound.  Neurological:  Negative for dizziness, syncope, speech difficulty, weakness, light-headedness, numbness and headaches.  Hematological:  Does not bruise/bleed easily.  Psychiatric/Behavioral:  Negative for agitation, behavioral problems, confusion, hallucinations, self-injury, sleep disturbance and suicidal ideas. The patient is not nervous/anxious.        Forgetfull    Immunization History  Administered Date(s) Administered   Fluad Quad(high Dose 65+) 03/04/2019, 03/09/2020, 12/23/2020   Influenza Split 02/10/2012   Influenza, High Dose Seasonal PF 02/15/2017, 02/02/2018, 01/31/2022, 02/04/2023   Influenza-Unspecified 01/13/2015, 02/01/2016   Moderna Covid-19 Vaccine Bivalent Booster 48yrs & up 02/04/2023   PFIZER(Purple Top)SARS-COV-2 Vaccination 06/07/2019, 07/02/2019, 02/08/2020   Pfizer Covid-19 Vaccine Bivalent Booster 79yrs & up 01/07/2021   Pneumococcal Conjugate-13 02/25/2016   Pneumococcal Polysaccharide-23 05/04/2007, 03/01/2017   Td 06/17/1997    Tdap 05/04/2007, 12/17/2012   Unspecified SARS-COV-2 Vaccination 01/31/2022   Zoster, Live 12/30/2013   Pertinent  Health Maintenance Due  Topic Date Due   HEMOGLOBIN A1C  03/20/2023   FOOT EXAM  03/25/2023   OPHTHALMOLOGY EXAM  11/10/2023   Colonoscopy  03/27/2024   MAMMOGRAM  02/12/2025   INFLUENZA VACCINE  Completed   DEXA SCAN  Completed      03/22/2021   10:42 AM 09/22/2021    9:36 AM 03/24/2022    8:53 AM 09/26/2022    8:57 AM 03/28/2023    8:41 AM  Fall Risk  Falls in the past year? 0 0 0 0 0  Was there an injury with Fall? 0 0 0 0 0  Fall Risk Category Calculator 0 0 0 0 0  Fall Risk Category (Retired) Low Low Low    (RETIRED) Patient Fall Risk Level Low fall risk Low fall risk Low fall risk    Patient at Risk for Falls Due to No Fall Risks No Fall Risks No Fall Risks No Fall Risks   Fall risk Follow up Falls evaluation completed Falls evaluation completed Falls evaluation completed Falls evaluation completed    Functional Status Survey:    Vitals:   03/28/23 0845  BP:  130/80  Pulse: 85  Resp: 18  Temp: 97.8 F (36.6 C)  SpO2: 99%  Weight: 99 lb 3.2 oz (45 kg)  Height: 5\' 3"  (1.6 m)   Body mass index is 17.57 kg/m. Physical Exam Vitals reviewed.  Constitutional:      General: She is not in acute distress.    Appearance: Normal appearance. She is underweight. She is not ill-appearing or diaphoretic.  HENT:     Head: Normocephalic.     Right Ear: Tympanic membrane, ear canal and external ear normal. There is no impacted cerumen.     Left Ear: Tympanic membrane, ear canal and external ear normal. There is no impacted cerumen.     Nose: Nose normal. No congestion or rhinorrhea.     Mouth/Throat:     Mouth: Mucous membranes are moist.     Pharynx: Oropharynx is clear. No oropharyngeal exudate or posterior oropharyngeal erythema.  Eyes:     General: No scleral icterus.       Right eye: No discharge.        Left eye: No discharge.     Extraocular  Movements: Extraocular movements intact.     Conjunctiva/sclera: Conjunctivae normal.     Pupils: Pupils are equal, round, and reactive to light.  Neck:     Vascular: No carotid bruit.  Cardiovascular:     Rate and Rhythm: Normal rate and regular rhythm.     Pulses: Normal pulses.     Heart sounds: Normal heart sounds. No murmur heard.    No friction rub. No gallop.  Pulmonary:     Effort: Pulmonary effort is normal. No respiratory distress.     Breath sounds: Normal breath sounds. No wheezing, rhonchi or rales.  Chest:     Chest wall: No tenderness.  Abdominal:     General: Bowel sounds are normal. There is no distension.     Palpations: Abdomen is soft. There is no mass.     Tenderness: There is no abdominal tenderness. There is no right CVA tenderness, left CVA tenderness, guarding or rebound.  Musculoskeletal:        General: No swelling or tenderness. Normal range of motion.     Cervical back: Normal range of motion. No rigidity or tenderness.     Right knee: Crepitus present. No swelling, effusion, erythema or ecchymosis. Normal range of motion. No tenderness.     Left knee: Crepitus present. No swelling, effusion, erythema or ecchymosis. Normal range of motion. No tenderness.     Right lower leg: No edema.     Left lower leg: No edema.  Feet:     Right foot:     Skin integrity: Skin integrity normal.     Toenail Condition: Right toenails are long and ingrown.     Left foot:     Skin integrity: Skin integrity normal.     Toenail Condition: Left toenails are long.  Lymphadenopathy:     Cervical: No cervical adenopathy.  Skin:    General: Skin is warm and dry.     Coloration: Skin is not pale.     Findings: No bruising, erythema, lesion or rash.  Neurological:     Mental Status: She is alert and oriented to person, place, and time.     Cranial Nerves: No cranial nerve deficit.     Sensory: No sensory deficit.     Motor: No weakness.     Coordination: Coordination  normal.     Gait: Gait normal.  Psychiatric:        Mood and Affect: Mood normal.        Speech: Speech normal.        Behavior: Behavior normal.        Thought Content: Thought content normal.        Judgment: Judgment normal.     Labs reviewed: Recent Labs    09/26/22 0934  NA 141  K 4.3  CL 103  CO2 30  GLUCOSE 88  BUN 21  CREATININE 1.00  CALCIUM 10.4   Recent Labs    09/26/22 0934  AST 22  ALT 15  BILITOT 0.4  PROT 7.2   Recent Labs    09/26/22 0934  WBC 7.1  NEUTROABS 4,651  HGB 13.0  HCT 39.8  MCV 94.1  PLT 374   Lab Results  Component Value Date   TSH 2.94 09/26/2022   Lab Results  Component Value Date   HGBA1C 5.5 09/18/2022   Lab Results  Component Value Date   CHOL 165 09/26/2022   HDL 64 09/26/2022   LDLCALC 87 09/26/2022   TRIG 57 09/26/2022   CHOLHDL 2.6 09/26/2022    Significant Diagnostic Results in last 30 days:  No results found.  Assessment/Plan 1. Essential hypertension, benign (Primary) - B/p well controlled  - continue dietary modification and exercise at least three times per week for 30 minutes.  -Continue on lisinopril -Continue on aspirin and atorvastatin for cardiovascular event prevention - TSH - COMPLETE METABOLIC PANEL WITH GFR - CBC with Differential/Platelet  2. Acquired hypothyroidism Lab Results  Component Value Date   TSH 2.94 09/26/2022  -Continue on levothyroxine 75 mcg daily on empty stomach - TSH  3. Hyperlipidemia LDL goal <100 -Previous LDL at goal -Continue with dietary modification and exercise as above -Continue on atorvastatin - Lipid panel  4. DM type 2 with diabetic mixed hyperlipidemia (HCC) Lab Results  Component Value Date   HGBA1C 5.5 09/18/2022  -Home CBG reported well-controlled without any symptoms of hypoglycemia reported -Continue on metformin -Continue on ACE inhibitor for renal protection -Continue on atorvastatin and aspirin for cardiovascular event  prevention -Continue on dietary modification and exercise as above -Annual eye exam up-to-date [facility] next appointment due 05/16/2023 -No symptoms of neuropathy reported.  Monofilament sensation intact this visit. -Will refer to podiatrist for annual foot exam and trim toenails - Hemoglobin A1c - Microalbumin / creatinine urine ratio - Ambulatory referral to Podiatry  5. Primary osteoarthritis involving multiple joints Arthritic changes on fingers and crepitus noted on both knees Continue with  over-the-counter analgesic as needed.  6. Ingrown toenail of right foot Will refer to podiatrist for further evaluation - Ambulatory referral to Podiatry  7. Overgrown toenails Podiatrist to trim toenails - Ambulatory referral to Podiatry  8. Mild protein-calorie malnutrition (HCC) Has had 1 pound weight loss since last visit.  Advised to increase protein intake in the diet -Will recheck lab work this visit  Family/ staff Communication: Reviewed plan of care with patient verbalized understanding  Labs/tests ordered:  - Hemoglobin A1c - Microalbumin / creatinine urine ratio - TSH - COMPLETE METABOLIC PANEL WITH GFR - CBC with Differential/Platelet - Lipid panel  Next Appointment : Return in about 6 months (around 09/26/2023) for medical mangement of chronic issues.AWV soon. Caesar Bookman, NP

## 2023-03-28 NOTE — Assessment & Plan Note (Signed)
Advised to increase protein supplement in the diet.  Will recheck lab work this visit

## 2023-03-29 LAB — COMPLETE METABOLIC PANEL WITH GFR
AG Ratio: 1.4 (calc) (ref 1.0–2.5)
ALT: 14 U/L (ref 6–29)
AST: 25 U/L (ref 10–35)
Albumin: 4.3 g/dL (ref 3.6–5.1)
Alkaline phosphatase (APISO): 53 U/L (ref 37–153)
BUN/Creatinine Ratio: 18 (calc) (ref 6–22)
BUN: 21 mg/dL (ref 7–25)
CO2: 31 mmol/L (ref 20–32)
Calcium: 10.4 mg/dL (ref 8.6–10.4)
Chloride: 100 mmol/L (ref 98–110)
Creat: 1.15 mg/dL — ABNORMAL HIGH (ref 0.60–1.00)
Globulin: 3.1 g/dL (ref 1.9–3.7)
Glucose, Bld: 91 mg/dL (ref 65–99)
Potassium: 4.4 mmol/L (ref 3.5–5.3)
Sodium: 139 mmol/L (ref 135–146)
Total Bilirubin: 0.4 mg/dL (ref 0.2–1.2)
Total Protein: 7.4 g/dL (ref 6.1–8.1)
eGFR: 51 mL/min/{1.73_m2} — ABNORMAL LOW (ref >=60)

## 2023-03-29 LAB — HEMOGLOBIN A1C
Hgb A1c MFr Bld: 6 %{Hb} — ABNORMAL HIGH (ref <5.7)
Mean Plasma Glucose: 126 mg/dL
eAG (mmol/L): 7 mmol/L

## 2023-03-29 LAB — CBC WITH DIFFERENTIAL/PLATELET
Absolute Lymphocytes: 1848 {cells}/uL (ref 850–3900)
Absolute Monocytes: 385 {cells}/uL (ref 200–950)
Basophils Absolute: 42 {cells}/uL (ref 0–200)
Basophils Relative: 0.6 %
Eosinophils Absolute: 119 {cells}/uL (ref 15–500)
Eosinophils Relative: 1.7 %
HCT: 39.1 % (ref 35.0–45.0)
Hemoglobin: 12.8 g/dL (ref 11.7–15.5)
MCH: 30.1 pg (ref 27.0–33.0)
MCHC: 32.7 g/dL (ref 32.0–36.0)
MCV: 92 fL (ref 80.0–100.0)
MPV: 9.6 fL (ref 7.5–12.5)
Monocytes Relative: 5.5 %
Neutro Abs: 4606 {cells}/uL (ref 1500–7800)
Neutrophils Relative %: 65.8 %
Platelets: 338 10*3/uL (ref 140–400)
RBC: 4.25 10*6/uL (ref 3.80–5.10)
RDW: 12.5 % (ref 11.0–15.0)
Total Lymphocyte: 26.4 %
WBC: 7 10*3/uL (ref 3.8–10.8)

## 2023-03-29 LAB — LIPID PANEL
Cholesterol: 173 mg/dL (ref <200)
HDL: 74 mg/dL (ref >=50)
LDL Cholesterol (Calc): 86 mg/dL
Non-HDL Cholesterol (Calc): 99 mg/dL (ref <130)
Total CHOL/HDL Ratio: 2.3 (calc) (ref <5.0)
Triglycerides: 54 mg/dL (ref <150)

## 2023-03-29 LAB — MICROALBUMIN / CREATININE URINE RATIO
Creatinine, Urine: 30 mg/dL (ref 20–275)
Microalb Creat Ratio: 7 mg/g{creat} (ref <30)
Microalb, Ur: 0.2 mg/dL

## 2023-03-29 LAB — TSH: TSH: 1.57 m[IU]/L (ref 0.40–4.50)

## 2023-04-17 ENCOUNTER — Other Ambulatory Visit: Payer: Self-pay | Admitting: Family

## 2023-04-17 DIAGNOSIS — I10 Essential (primary) hypertension: Secondary | ICD-10-CM

## 2023-04-20 ENCOUNTER — Ambulatory Visit: Payer: Medicare Other | Admitting: Family

## 2023-04-20 ENCOUNTER — Encounter: Payer: Self-pay | Admitting: Family

## 2023-04-20 VITALS — BP 128/78 | HR 88 | Temp 97.4°F | Resp 20 | Ht 63.0 in | Wt 100.4 lb

## 2023-04-20 DIAGNOSIS — Z0000 Encounter for general adult medical examination without abnormal findings: Secondary | ICD-10-CM | POA: Diagnosis not present

## 2023-04-20 NOTE — Progress Notes (Signed)
 Subjective:   Sierra Combs is a 73 y.o. female who presents for Medicare Annual (Subsequent) preventive examination.  Visit Complete: In person  Patient Medicare AWV questionnaire was completed by the patient on 04/20/2023; I have confirmed that all information answered by patient is correct and no changes since this date.  Cardiac Risk Factors include: advanced age (>69men, >6 women);diabetes mellitus;hypertension     Objective:    Today's Vitals   04/20/23 0927  BP: 128/78  Pulse: 88  Resp: 20  Temp: (!) 97.4 F (36.3 C)  SpO2: 98%  Weight: 100 lb 6.4 oz (45.5 kg)  Height: 5' 3 (1.6 m)   Body mass index is 17.79 kg/m.     04/20/2023    9:35 AM 03/28/2023    8:43 AM 03/24/2022    8:54 AM 09/22/2021    9:36 AM 03/22/2021   10:41 AM 03/23/2020    8:48 AM 03/04/2019   10:10 AM  Advanced Directives  Does Patient Have a Medical Advance Directive? Yes Yes No Yes Yes No No  Type of Advance Directive Out of facility DNR (pink MOST or yellow form) Living will  Out of facility DNR (pink MOST or yellow form) Out of facility DNR (pink MOST or yellow form)    Does patient want to make changes to medical advance directive? No - Patient declined No - Patient declined  No - Patient declined No - Patient declined    Would patient like information on creating a medical advance directive?   No - Patient declined    Yes (MAU/Ambulatory/Procedural Areas - Information given)  Pre-existing out of facility DNR order (yellow form or pink MOST form) Pink MOST form placed in chart (order not valid for inpatient use)   Pink MOST form placed in chart (order not valid for inpatient use) Pink MOST form placed in chart (order not valid for inpatient use)      Current Medications (verified) Outpatient Encounter Medications as of 04/20/2023  Medication Sig   aspirin EC 81 MG tablet Take 81 mg by mouth daily. Swallow whole.   atorvastatin  (LIPITOR) 80 MG tablet TAKE 1 TABLET(80 MG) BY MOUTH DAILY    Calcium  Carbonate-Vitamin D  (CALCIUM  600 + D PO) Take 1 tablet by mouth 2 (two) times daily.   fish oil-omega-3 fatty acids 1000 MG capsule Take 1 g by mouth daily.   levothyroxine  (SYNTHROID ) 75 MCG tablet TAKE 1 TABLET(75 MCG) BY MOUTH DAILY BEFORE BREAKFAST   lisinopril  (ZESTRIL ) 20 MG tablet TAKE 1 TABLET(20 MG) BY MOUTH DAILY   metFORMIN  (GLUCOPHAGE ) 500 MG tablet TAKE 1 TABLET(500 MG) BY MOUTH TWICE DAILY WITH A MEAL   Multiple Vitamins-Minerals (MULTIVITAMIN WITH MINERALS) tablet Take 1 tablet by mouth daily.   No facility-administered encounter medications on file as of 04/20/2023.    Allergies (verified) Patient has no known allergies.   History: Past Medical History:  Diagnosis Date   Arthritis    Breast cyst 3cm L breast   Colonic polyp    Diabetes mellitus type 2    Diverticulosis    Fatty liver    H/O bone density study 2020   H/O mammogram 2022   Hyperlipidemia small particles 6/06   Hypertension    Menopause 04/11/2000   Osteopenia L hip   Unspecified hypothyroidism 09/09/2010   Past Surgical History:  Procedure Laterality Date   BREAST BIOPSY  7/06 L breast benign   CESAREAN SECTION     COLONOSCOPY  6/05, 09/2008, 11/2013  Dr. Lennard   Family History  Problem Relation Age of Onset   Diabetes Mother    Heart disease Mother    Hyperlipidemia Mother    Hypertension Mother    Diabetes Father    Heart disease Father    Hyperlipidemia Father    Hypertension Father    Diabetes Sister    Diabetes Sister    Lung cancer Sister        not smoker; +exposure from husband; works in Corporate Treasurer Sister        lung, stage 4   Diabetes Brother        legs amputated   Kidney disease Brother        on dialysis   Social History   Socioeconomic History   Marital status: Married    Spouse name: Not on file   Number of children: 1   Years of education: Not on file   Highest education level: Not on file  Occupational History   Occupation: housekeeping     Employer: [employer]  Tobacco Use   Smoking status: Never   Smokeless tobacco: Never   Tobacco comments:    passive tobacco exposure from husband  Vaping Use   Vaping status: Never Used  Substance and Sexual Activity   Alcohol use: No   Drug use: No   Sexual activity: Yes    Partners: Male  Other Topics Concern   Not on file  Social History Narrative   Lives with husband, no pets.  Daughter lives in [place].  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[employer]), but started working part-time (education officer, environmental, [employer], at night)   Social Drivers of Corporate Investment Banker Strain: Not on file  Food Insecurity: Not on file  Transportation Needs: Not on file  Physical Activity: Not on file  Stress: Not on file  Social Connections: Not on file    Tobacco Counseling Counseling given: Not Answered Tobacco comments: passive tobacco exposure from husband   Clinical Intake:  Pre-visit preparation completed: No  Pain : No/denies pain     BMI - recorded: 17.79 Nutritional Status: BMI <19  Underweight Nutritional Risks: None Diabetes: Yes (90's - 100's) CBG done?: No CBG resulted in Enter/ Edit results?: No Did pt. bring in CBG monitor from home?: Yes Glucose Meter Downloaded?: No  How often do you need to have someone help you when you read instructions, pamphlets, or other written materials from your doctor or pharmacy?: 1 - Never What is the last grade level you completed in school?: 12 grade  Interpreter Needed?: No  Information entered by :: Porsha McClurkin,CMA   Activities of Daily Living    04/20/2023    9:55 AM 04/20/2023    9:32 AM  In your present state of health, do you have any difficulty performing the following activities:  Hearing? 0 0  Vision? 0 0  Difficulty concentrating or making decisions? 0 0  Walking or climbing stairs? 0 0  Dressing or  bathing? 0 0  Doing errands, shopping? 0 0  Preparing Food and eating ? N N  Using the Toilet? N N  In the past six months, have you accidently leaked urine? N N  Do you have problems with loss of bowel control? N N  Managing your Medications?  N  Managing your Finances? N N  Housekeeping or managing your Housekeeping? N N  Patient Care Team: Satvik Parco C, NP as PCP - General (Family Medicine) [name], MD as Consulting Physician (Ophthalmology)  Indicate any recent Medical Services you may have received from other than [facility] providers in the past year (date may be approximate).     Assessment:   This is a routine wellness examination for Sierra Combs.  Hearing/Vision screen No results found.   Goals Addressed             This Visit's Progress    Patient Stated       Hope to stay healthy        Depression Screen    04/20/2023    9:36 AM 03/28/2023    8:42 AM 09/26/2022    8:57 AM 09/22/2021    9:36 AM 03/23/2020    8:47 AM 10/23/2019    9:57 AM 03/04/2019   10:09 AM  PHQ 2/9 Scores  PHQ - 2 Score 0 0 0 0 0 0 0    Fall Risk    04/20/2023    9:36 AM 03/28/2023    8:41 AM 09/26/2022    8:57 AM 03/24/2022    8:53 AM 09/22/2021    9:36 AM  Fall Risk   Falls in the past year? 0 0 0 0 0  Number falls in past yr: 0 0 0 0 0  Injury with Fall? 0 0 0 0 0  Risk for fall due to : No Fall Risks  No Fall Risks No Fall Risks No Fall Risks  Follow up Falls evaluation completed  Falls evaluation completed Falls evaluation completed Falls evaluation completed    MEDICARE RISK AT HOME: Medicare Risk at Home Any stairs in or around the home?: No If so, are there any without handrails?: No Home free of loose throw rugs in walkways, pet beds, electrical cords, etc?: No Adequate lighting in your home to reduce risk of falls?: Yes Life alert?: No Use of a cane, walker or w/c?: No Grab bars in the bathroom?: Yes Shower chair or bench in shower?: No Elevated toilet seat  or a handicapped toilet?: No  TIMED UP AND GO:  Was the test performed?  Yes  Length of time to ambulate 10 feet: 4 sec Gait steady and fast without use of assistive device    Cognitive Function:    04/20/2023    9:38 AM  MMSE - Mini Mental State Exam  Orientation to time 5  Orientation to Place 5  Registration 3  Attention/ Calculation 5  Recall 3  Language- name 2 objects 2  Language- repeat 1  Language- follow 3 step command 3  Language- read & follow direction 1  Write a sentence 1  Copy design 0  Total score 29        Immunizations Immunization History  Administered Date(s) Administered   Fluad Quad(high Dose 65+) 03/04/2019, 03/09/2020, 12/23/2020   Influenza Split 02/10/2012   Influenza, High Dose Seasonal PF 02/15/2017, 02/02/2018, 01/31/2022, 02/04/2023   Influenza-Unspecified 01/13/2015, 02/01/2016   Moderna Covid-19 Vaccine Bivalent Booster 60yrs & up 02/04/2023   PFIZER(Purple Top)SARS-COV-2 Vaccination 06/07/2019, 07/02/2019, 02/08/2020   Pfizer Covid-19 Vaccine Bivalent Booster 104yrs & up 01/07/2021   Pneumococcal Conjugate-13 02/25/2016   Pneumococcal Polysaccharide-23 05/04/2007, 03/01/2017   Td 06/17/1997   Tdap 05/04/2007, 12/17/2012   Unspecified SARS-COV-2 Vaccination 01/31/2022   Zoster, Live 12/30/2013    TDAP status: Due, Education has been provided regarding the importance of this vaccine. Advised may receive this vaccine at local pharmacy or  Health Dept. Aware to provide a copy of the vaccination record if obtained from local pharmacy or Health Dept. Verbalized acceptance and understanding.  Flu Vaccine status: Up to date  Pneumococcal vaccine status: Up to date  Covid-19 vaccine status: Information provided on how to obtain vaccines.   Qualifies for Shingles Vaccine? Yes   Zostavax completed No   Shingrix Completed?: No.    Education has been provided regarding the importance of this vaccine. Patient has been advised to call insurance  company to determine out of pocket expense if they have not yet received this vaccine. Advised may also receive vaccine at local pharmacy or Health Dept. Verbalized acceptance and understanding.  Screening Tests Health Maintenance  Topic Date Due   Zoster Vaccines- Shingrix (1 of 2) 02/09/2001   DTaP/Tdap/Td (4 - Td or Tdap) 12/18/2022   COVID-19 Vaccine (7 - 2024-25 season) 04/01/2023   HEMOGLOBIN A1C  09/26/2023   OPHTHALMOLOGY EXAM  11/10/2023   Diabetic kidney evaluation - eGFR measurement  03/27/2024   Diabetic kidney evaluation - Urine ACR  03/27/2024   FOOT EXAM  03/27/2024   Colonoscopy  03/27/2024   Medicare Annual Wellness (AWV)  04/19/2024   MAMMOGRAM  02/12/2025   Pneumonia Vaccine 41+ Years old  Completed   INFLUENZA VACCINE  Completed   DEXA SCAN  Completed   Hepatitis C Screening  Completed   HPV VACCINES  Aged Out    Health Maintenance  Health Maintenance Due  Topic Date Due   Zoster Vaccines- Shingrix (1 of 2) 02/09/2001   DTaP/Tdap/Td (4 - Td or Tdap) 12/18/2022   COVID-19 Vaccine (7 - 2024-25 season) 04/01/2023    Colorectal cancer screening: Type of screening: Colonoscopy. Completed 12/17/20220. Repeat every 5 years  Mammogram status: Completed 02/13/2023. Repeat every year  Bone Density status: Completed 02/06/2019. Results reflect: Bone density results: OSTEOPENIA. Repeat every 3 years.  Lung Cancer Screening: (Low Dose CT Chest recommended if Age 69-80 years, 20 pack-year currently smoking OR have quit w/in 15years.) does not qualify.   Lung Cancer Screening Referral: No   Additional Screening:  Hepatitis C Screening: does qualify; Completed yes   Vision Screening: Recommended annual ophthalmology exams for early detection of glaucoma and other disorders of the eye. Is the patient up to date with their annual eye exam?  Yes  Who is the provider or what is the name of the office in which the patient attends annual eye exams? Dr.Hecker  If pt is  not established with a provider, would they like to be referred to a provider to establish care? No .   Dental Screening: Recommended annual dental exams for proper oral hygiene  Diabetic Foot Exam: Diabetic Foot Exam: Completed 03/28/2023  Community Resource Referral / Chronic Care Management: CRR required this visit?  No   CCM required this visit?  No     Plan:     I have personally reviewed and noted the following in the patient's chart:   Medical and social history Use of alcohol, tobacco or illicit drugs  Current medications and supplements including opioid prescriptions. Patient is not currently taking opioid prescriptions. Functional ability and status Nutritional status Physical activity Advanced directives List of other physicians Hospitalizations, surgeries, and ER visits in previous 12 months Vitals Screenings to include cognitive, depression, and falls Referrals and appointments  In addition, I have reviewed and discussed with patient certain preventive protocols, quality metrics, and best practice recommendations. A written personalized care plan for preventive services as well  as general preventive health recommendations were provided to patient.     Roxan JAYSON Plough, NP   04/20/2023   After Visit Summary: (In Person-Printed) AVS printed and given to the patient  Nurse Notes: Advised to get Tdap and COVID-19 vaccine

## 2023-04-20 NOTE — Patient Instructions (Signed)
 Sierra Combs , Thank you for taking time to come for your Medicare Wellness Visit. I appreciate your ongoing commitment to your health goals. Please review the following plan we discussed and let me know if I can assist you in the future.   Screening recommendations/referrals: Colonoscopy  : Up to date  Mammogram  : Up to date  Bone Density  : Up to date  Recommended yearly ophthalmology/optometry visit for glaucoma screening and checkup Recommended yearly dental visit for hygiene and checkup  Vaccinations: Influenza vaccine- due annually in September/October Pneumococcal vaccine : Up to date  Tdap vaccine Due please get vaccine at the pharmacy  Shingles vaccine : Up to date     Advanced directives: yes   Conditions/risks identified: advanced age (>24men, >73 women);diabetes mellitus;hypertension  Next appointment: 1 year    Preventive Care 73 Years and Older, Female Preventive care refers to lifestyle choices and visits with your health care provider that can promote health and wellness. What does preventive care include? A yearly physical exam. This is also called an annual well check. Dental exams once or twice a year. Routine eye exams. Ask your health care provider how often you should have your eyes checked. Personal lifestyle choices, including: Daily care of your teeth and gums. Regular physical activity. Eating a healthy diet. Avoiding tobacco and drug use. Limiting alcohol use. Practicing safe sex. Taking low-dose aspirin every day. Taking vitamin and mineral supplements as recommended by your health care provider. What happens during an annual well check? The services and screenings done by your health care provider during your annual well check will depend on your age, overall health, lifestyle risk factors, and family history of disease. Counseling  Your health care provider may ask you questions about your: Alcohol use. Tobacco use. Drug use. Emotional  well-being. Home and relationship well-being. Sexual activity. Eating habits. History of falls. Memory and ability to understand (cognition). Work and work astronomer. Reproductive health. Screening  You may have the following tests or measurements: Height, weight, and BMI. Blood pressure. Lipid and cholesterol levels. These may be checked every 5 years, or more frequently if you are over 43 years old. Skin check. Lung cancer screening. You may have this screening every year starting at age 27 if you have a 30-pack-year history of smoking and currently smoke or have quit within the past 15 years. Fecal occult blood test (FOBT) of the stool. You may have this test every year starting at age 80. Flexible sigmoidoscopy or colonoscopy. You may have a sigmoidoscopy every 5 years or a colonoscopy every 10 years starting at age 21. Hepatitis C blood test. Hepatitis B blood test. Sexually transmitted disease (STD) testing. Diabetes screening. This is done by checking your blood sugar (glucose) after you have not eaten for a while (fasting). You may have this done every 1-3 years. Bone density scan. This is done to screen for osteoporosis. You may have this done starting at age 50. Mammogram. This may be done every 1-2 years. Talk to your health care provider about how often you should have regular mammograms. Talk with your health care provider about your test results, treatment options, and if necessary, the need for more tests. Vaccines  Your health care provider may recommend certain vaccines, such as: Influenza vaccine. This is recommended every year. Tetanus, diphtheria, and acellular pertussis (Tdap, Td) vaccine. You may need a Td booster every 10 years. Zoster vaccine. You may need this after age 46. Pneumococcal 13-valent conjugate (PCV13) vaccine.  One dose is recommended after age 89. Pneumococcal polysaccharide (PPSV23) vaccine. One dose is recommended after age 79. Talk to your  health care provider about which screenings and vaccines you need and how often you need them. This information is not intended to replace advice given to you by your health care provider. Make sure you discuss any questions you have with your health care provider. Document Released: 04/24/2015 Document Revised: 12/16/2015 Document Reviewed: 01/27/2015 Elsevier Interactive Patient Education  2017 [employer].  Fall Prevention in the Home Falls can cause injuries. They can happen to people of all ages. There are many things you can do to make your home safe and to help prevent falls. What can I do on the outside of my home? Regularly fix the edges of walkways and driveways and fix any cracks. Remove anything that might make you trip as you walk through a door, such as a raised step or threshold. Trim any bushes or trees on the path to your home. Use bright outdoor lighting. Clear any walking paths of anything that might make someone trip, such as rocks or tools. Regularly check to see if handrails are loose or broken. Make sure that both sides of any steps have handrails. Any raised decks and porches should have guardrails on the edges. Have any leaves, snow, or ice cleared regularly. Use sand or salt on walking paths during winter. Clean up any spills in your garage right away. This includes oil or grease spills. What can I do in the bathroom? Use night lights. Install grab bars by the toilet and in the tub and shower. Do not use towel bars as grab bars. Use non-skid mats or decals in the tub or shower. If you need to sit down in the shower, use a plastic, non-slip stool. Keep the floor dry. Clean up any water that spills on the floor as soon as it happens. Remove soap buildup in the tub or shower regularly. Attach bath mats securely with double-sided non-slip rug tape. Do not have throw rugs and other things on the floor that can make you trip. What can I do in the bedroom? Use night  lights. Make sure that you have a light by your bed that is easy to reach. Do not use any sheets or blankets that are too big for your bed. They should not hang down onto the floor. Have a firm chair that has side arms. You can use this for support while you get dressed. Do not have throw rugs and other things on the floor that can make you trip. What can I do in the kitchen? Clean up any spills right away. Avoid walking on wet floors. Keep items that you use a lot in easy-to-reach places. If you need to reach something above you, use a strong step stool that has a grab bar. Keep electrical cords out of the way. Do not use floor polish or wax that makes floors slippery. If you must use wax, use non-skid floor wax. Do not have throw rugs and other things on the floor that can make you trip. What can I do with my stairs? Do not leave any items on the stairs. Make sure that there are handrails on both sides of the stairs and use them. Fix handrails that are broken or loose. Make sure that handrails are as long as the stairways. Check any carpeting to make sure that it is firmly attached to the stairs. Fix any carpet that is loose or  worn. Avoid having throw rugs at the top or bottom of the stairs. If you do have throw rugs, attach them to the floor with carpet tape. Make sure that you have a light switch at the top of the stairs and the bottom of the stairs. If you do not have them, ask someone to add them for you. What else can I do to help prevent falls? Wear shoes that: Do not have high heels. Have rubber bottoms. Are comfortable and fit you well. Are closed at the toe. Do not wear sandals. If you use a stepladder: Make sure that it is fully opened. Do not climb a closed stepladder. Make sure that both sides of the stepladder are locked into place. Ask someone to hold it for you, if possible. Clearly mark and make sure that you can see: Any grab bars or handrails. First and last  steps. Where the edge of each step is. Use tools that help you move around (mobility aids) if they are needed. These include: Canes. Walkers. Scooters. Crutches. Turn on the lights when you go into a dark area. Replace any light bulbs as soon as they burn out. Set up your furniture so you have a clear path. Avoid moving your furniture around. If any of your floors are uneven, fix them. If there are any pets around you, be aware of where they are. Review your medicines with your doctor. Some medicines can make you feel dizzy. This can increase your chance of falling. Ask your doctor what other things that you can do to help prevent falls. This information is not intended to replace advice given to you by your health care provider. Make sure you discuss any questions you have with your health care provider. Document Released: 01/22/2009 Document Revised: 09/03/2015 Document Reviewed: 05/02/2014 Elsevier Interactive Patient Education  2017 [employer].

## 2023-04-24 ENCOUNTER — Ambulatory Visit: Payer: Medicare Other | Admitting: Podiatry

## 2023-04-25 ENCOUNTER — Ambulatory Visit: Payer: Medicare Other | Admitting: Podiatry

## 2023-04-25 ENCOUNTER — Encounter: Payer: Self-pay | Admitting: Podiatry

## 2023-04-25 DIAGNOSIS — B351 Tinea unguium: Secondary | ICD-10-CM | POA: Diagnosis not present

## 2023-04-25 DIAGNOSIS — M79675 Pain in left toe(s): Secondary | ICD-10-CM

## 2023-04-25 DIAGNOSIS — M2012 Hallux valgus (acquired), left foot: Secondary | ICD-10-CM

## 2023-04-25 DIAGNOSIS — M79674 Pain in right toe(s): Secondary | ICD-10-CM

## 2023-04-25 NOTE — Progress Notes (Signed)
       Subjective:  Patient ID: Sierra Combs, female    DOB: 1950/11/16,  MRN: [id number]   VERNE COVE presents to clinic today for:  Chief Complaint  Patient presents with   Debridement    Requesting toenail trim - trying to clip herself but getting harder to reach   New Patient (Initial Visit)    Est pt 2019   Patient notes nails are thick, discolored, elongated and painful in shoegear when trying to ambulate.    PCP is Ngetich, Roxan BROCKS, NP.  Past Medical History:  Diagnosis Date   Arthritis    Breast cyst 3cm L breast   Colonic polyp    Diabetes mellitus type 2    Diverticulosis    Fatty liver    H/O bone density study 2020   H/O mammogram 2022   Hyperlipidemia small particles 6/06   Hypertension    Menopause 04/11/2000   Osteopenia L hip   Unspecified hypothyroidism 09/09/2010    Past Surgical History:  Procedure Laterality Date   BREAST BIOPSY  7/06 L breast benign   CESAREAN SECTION     COLONOSCOPY  6/05, 09/2008, 11/2013   Dr. Lennard    No Known Allergies  Review of Systems: Negative except as noted in the HPI.  Objective:  Sierra Combs is a pleasant 73 y.o. female in NAD. AAO x 3.  Vascular Examination: Capillary refill time is 3-5 seconds to toes bilateral. Palpable pedal pulses b/l LE. Digital hair present b/l.  Skin temperature gradient WNL b/l. No varicosities b/l. No cyanosis noted b/l.   Dermatological Examination: Pedal skin with normal turgor, texture and tone b/l. No open wounds. No interdigital macerations b/l. Toenails x10 are 3mm thick, discolored, dystrophic with subungual debris. There is pain with compression of the nail plates.  They are elongated x10  Neurological Examination: Protective sensation intact bilateral LE. Vibratory sensation intact bilateral LE.  Musculoskeletal Examination: Lateral deviation of left hallux, pushing against 2nd toe.       Latest Ref Rng & Units 03/28/2023    9:33 AM 09/18/2022   12:00 AM   Hemoglobin A1C  Hemoglobin-A1c <5.7 % of total Hgb 6.0  5.5         This result is from an external source.   Assessment/Plan: 1. Pain due to onychomycosis of toenails of both feet   2. Hallux valgus, left    The mycotic toenails were sharply debrided x10 with sterile nail nippers and a power debriding burr to decrease bulk/thickness and length.    She was given a gel double buddy toe loop for the left hallux and 2nd toe to hold them in proper position and decrease irritation.    Return in about 3 months (around 07/24/2023) for RFC.   [name] Imperial, DPM, FACFAS [facility]     [address] [place], [place] [address]                Office [phone]  Fax [phone]

## 2023-05-09 ENCOUNTER — Other Ambulatory Visit: Payer: Self-pay | Admitting: Family

## 2023-05-09 DIAGNOSIS — E7800 Pure hypercholesterolemia, unspecified: Secondary | ICD-10-CM

## 2023-05-16 ENCOUNTER — Other Ambulatory Visit: Payer: Self-pay | Admitting: Family

## 2023-05-16 DIAGNOSIS — E039 Hypothyroidism, unspecified: Secondary | ICD-10-CM

## 2023-05-17 DIAGNOSIS — H40013 Open angle with borderline findings, low risk, bilateral: Secondary | ICD-10-CM | POA: Diagnosis not present

## 2023-06-30 ENCOUNTER — Other Ambulatory Visit: Payer: Self-pay | Admitting: Family

## 2023-06-30 NOTE — Telephone Encounter (Signed)
 Copied from CRM [id number]. Topic: Clinical - Medication Refill >> Jun 30, 2023 11:17 AM Everette Rank wrote: Most Recent Primary Care Visit:  Provider: Richarda Blade C  Department: [facility]  Visit Type: MEDICARE AWV, SEQUENTIAL  Date: 04/20/2023  Medication: metFORMIN (GLUCOPHAGE) 500 MG tablet  Has the patient contacted their pharmacy? Yes (Agent: If no, request that the patient contact the pharmacy for the refill. If patient does not wish to contact the pharmacy document the reason why and proceed with request.) (Agent: If yes, when and what did the pharmacy advise?)  Is this the correct pharmacy for this prescription? Yes If no, delete pharmacy and type the correct one.  This is the patient's preferred pharmacy:  [facility] [address] [place] - [address] [place] [address] Phone: [phone] Fax: [phone]   Has the prescription been filled recently? No  Is the patient out of the medication? No 1 week left  Has the patient been seen for an appointment in the last year OR does the patient have an upcoming appointment? Yes  Can we respond through MyChart? No  Agent: Please be advised that Rx refills may take up to 3 business days. We ask that you follow-up with your pharmacy.

## 2023-09-11 ENCOUNTER — Ambulatory Visit: Admitting: Podiatry

## 2023-09-11 DIAGNOSIS — B351 Tinea unguium: Secondary | ICD-10-CM | POA: Diagnosis not present

## 2023-09-11 DIAGNOSIS — M79675 Pain in left toe(s): Secondary | ICD-10-CM

## 2023-09-11 DIAGNOSIS — M79674 Pain in right toe(s): Secondary | ICD-10-CM | POA: Diagnosis not present

## 2023-09-11 NOTE — Progress Notes (Signed)
    Subjective:  Patient ID: Sierra Combs, female    DOB: 12-25-1950,  MRN: [id number]  Sierra Combs presents to clinic today for:  Chief Complaint  Patient presents with   Nail Problem    Nail trim    Patient notes nails are thick, discolored, elongated and painful in shoegear when trying to ambulate.  Patient states that she likes the envelope toe spacer for the first interspace of the left foot.  She is requesting another 1 today.  PCP is Ngetich, Elijio Guadeloupe, NP.  Past Medical History:  Diagnosis Date   Arthritis    Breast cyst 3cm L breast   Colonic polyp    Diabetes mellitus type 2    Diverticulosis    Fatty liver    H/O bone density study 2020   H/O mammogram 2022   Hyperlipidemia small particles 6/06   Hypertension    Menopause 04/11/2000   Osteopenia L hip   Unspecified hypothyroidism 09/09/2010   Past Surgical History:  Procedure Laterality Date   BREAST BIOPSY  7/06 L breast benign   CESAREAN SECTION     COLONOSCOPY  6/05, 09/2008, 11/2013   Dr. Elsie Halo   No Known Allergies  Review of Systems: Negative except as noted in the HPI.  Objective:  Sierra Combs is a pleasant 73 y.o. female in NAD. AAO x 3.  Vascular Examination: Capillary refill time is 3-5 seconds to toes bilateral. Palpable pedal pulses b/l LE. Digital hair present b/l.  Skin temperature gradient WNL b/l. No varicosities b/l. No cyanosis noted b/l.   Dermatological Examination: Pedal skin with normal turgor, texture and tone b/l. No open wounds. No interdigital macerations b/l. Toenails x10 are 3mm thick, discolored, dystrophic with subungual debris. There is pain with compression of the nail plates.  They are elongated x10     Latest Ref Rng & Units 03/28/2023    9:33 AM 09/18/2022   12:00 AM  Hemoglobin A1C  Hemoglobin-A1c <5.7 % of total Hgb 6.0  5.5         This result is from an external source.   Assessment/Plan: 1. Pain due to onychomycosis of toenails of both feet    The mycotic  toenails were sharply debrided x10 with sterile nail nippers and a power debriding burr to decrease bulk/thickness and length.    A single loop gel toe spacer was dispensed to the patient.  She can also purchase multiples of these at the front desk at checkout for her convenience  Return in about 4 months (around 01/11/2024) for [facility].   Joe Murders, DPM, FACFAS [facility]     [address] [place], [place] [address]                Office [phone]  Fax [phone]

## 2023-09-27 ENCOUNTER — Ambulatory Visit (INDEPENDENT_AMBULATORY_CARE_PROVIDER_SITE_OTHER): Payer: Medicare Other | Admitting: Family

## 2023-09-27 ENCOUNTER — Encounter: Payer: Self-pay | Admitting: Family

## 2023-09-27 VITALS — BP 122/76 | HR 84 | Temp 97.8°F | Resp 19 | Ht 63.0 in | Wt 96.2 lb

## 2023-09-27 DIAGNOSIS — N1831 Chronic kidney disease, stage 3a: Secondary | ICD-10-CM

## 2023-09-27 DIAGNOSIS — I10 Essential (primary) hypertension: Secondary | ICD-10-CM | POA: Diagnosis not present

## 2023-09-27 DIAGNOSIS — E039 Hypothyroidism, unspecified: Secondary | ICD-10-CM

## 2023-09-27 DIAGNOSIS — E782 Mixed hyperlipidemia: Secondary | ICD-10-CM | POA: Diagnosis not present

## 2023-09-27 DIAGNOSIS — Z23 Encounter for immunization: Secondary | ICD-10-CM

## 2023-09-27 DIAGNOSIS — E1169 Type 2 diabetes mellitus with other specified complication: Secondary | ICD-10-CM | POA: Diagnosis not present

## 2023-09-27 NOTE — Progress Notes (Signed)
 Provider: Christean Courts FNP-C   Sierra Combs, Sierra Guadeloupe, NP  Patient Care Team: Sierra Combs, Sierra Guadeloupe, NP as PCP - General (Family Medicine) Amedeo Jupiter, MD as Consulting Physician (Ophthalmology)  Extended Emergency Contact Information Primary Emergency Contact: Sierra Combs Address: [address] [place],  [place] Home Phone: [phone] Relation: Spouse Secondary Emergency Contact: Sierra Combs  [place] Home Phone: [phone] Relation: Daughter  Code Status:  Full Code  Goals of care: Advanced Directive information    09/27/2023    8:46 AM  Advanced Directives  Does Patient Have a Medical Advance Directive? Yes  Does patient want to make changes to medical advance directive? No - Patient declined     Chief Complaint  Patient presents with   Medical Management of Chronic Issues    6 month follow up    Discussed the use of AI scribe software for clinical note transcription with the patient, who gave verbal consent to proceed.  History of Present Illness   Sierra Combs is a 73 year old female with diabetes and hypertension who presents for a six-month follow-up to monitor kidney function.  In December, her creatinine level increased from 1.0 to 1.15, though no proteinuria was noted in the last urine test. She is focused on controlling her blood pressure and blood sugar to prevent further kidney damage.  She monitors her blood sugar at home, with recent readings of 98 and 91 after a short walk. Her last A1c was 6.0, up from 5.5 a year ago. She has been drinking more water and avoiding sugary foods, including cakes and pies, to manage her diabetes. She acknowledges a fondness for bread and starchy foods but is trying to manage portion sizes.  Her current medications include fish oil, calcium  with vitamin D , a multivitamin with minerals, aspirin 81 mg, lisinopril  for blood pressure, atorvastatin  8 mg for cholesterol,  levothyroxine  75 mcg, and metformin  500 mg. She has lost weight, currently weighing 96.2 pounds, down from 100 pounds. She is trying to increase her protein intake to gain weight, consuming eggs and considering protein supplements.  She walks about a mile and a half every evening with her daughter. No recent falls, constipation, diarrhea, urinary issues, or pain. No numbness or tingling in her hands or feet. Her arthritis symptoms have improved with increased activity, and she uses a stress ball to exercise her fingers. She experiences occasional mood fluctuations but manages them by staying active and listening to music.  She drinks water regularly and avoids sugary drinks. She has a history of sinus issues during the spring but no significant vision changes. She has an eye doctor appointment scheduled in August.   Past Medical History:  Diagnosis Date   Arthritis    Breast cyst 3cm L breast   Colonic polyp    Diabetes mellitus type 2    Diverticulosis    Fatty liver    H/O bone density study 2020   H/O mammogram 2022   Hyperlipidemia small particles 6/06   Hypertension    Menopause 04/11/2000   Osteopenia L hip   Unspecified hypothyroidism 09/09/2010   Past Surgical History:  Procedure Laterality Date   BREAST BIOPSY  7/06 L breast benign   CESAREAN SECTION     COLONOSCOPY  6/05, 09/2008, 11/2013   Dr. Ganem    No Known Allergies  Allergies as of 09/27/2023   No Known Allergies  Medication List        Accurate as of September 27, 2023  9:31 AM. If you have any questions, ask your nurse or doctor.          aspirin EC 81 MG tablet Take 81 mg by mouth daily. Swallow whole.   atorvastatin  80 MG tablet Commonly known as: LIPITOR TAKE 1 TABLET(80 MG) BY MOUTH DAILY   CALCIUM  600 + D PO Take 1 tablet by mouth 2 (two) times daily.   fish oil-omega-3 fatty acids 1000 MG capsule Take 1 g by mouth daily.   levothyroxine  75 MCG tablet Commonly known as: SYNTHROID  TAKE  1 TABLET(75 MCG) BY MOUTH DAILY BEFORE BREAKFAST   lisinopril  20 MG tablet Commonly known as: ZESTRIL  TAKE 1 TABLET(20 MG) BY MOUTH DAILY   metFORMIN  500 MG tablet Commonly known as: GLUCOPHAGE  TAKE 1 TABLET(500 MG) BY MOUTH TWICE DAILY WITH A MEAL   multivitamin with minerals tablet Take 1 tablet by mouth daily.        Review of Systems  Constitutional:  Positive for unexpected weight change. Negative for appetite change, chills, fatigue and fever.  HENT:  Negative for congestion, dental problem, ear discharge, ear pain, facial swelling, hearing loss, nosebleeds, postnasal drip, rhinorrhea, sinus pressure, sinus pain, sneezing, sore throat, tinnitus and trouble swallowing.   Eyes:  Negative for pain, discharge, redness, itching and visual disturbance.  Respiratory:  Negative for cough, chest tightness, shortness of breath and wheezing.   Cardiovascular:  Negative for chest pain, palpitations and leg swelling.  Gastrointestinal:  Negative for abdominal distention, abdominal pain, blood in stool, constipation, diarrhea, nausea and vomiting.  Endocrine: Negative for cold intolerance, heat intolerance, polydipsia, polyphagia and polyuria.  Genitourinary:  Negative for difficulty urinating, dysuria, flank pain, frequency and urgency.  Musculoskeletal:  Positive for arthralgias. Negative for back pain, gait problem, joint swelling, myalgias, neck pain and neck stiffness.       Fingers and knees   Skin:  Negative for color change, pallor, rash and wound.  Neurological:  Negative for dizziness, syncope, speech difficulty, weakness, light-headedness, numbness and headaches.  Hematological:  Does not bruise/bleed easily.  Psychiatric/Behavioral:  Negative for agitation, behavioral problems, confusion, hallucinations, self-injury, sleep disturbance and suicidal ideas. The patient is not nervous/anxious.     Immunization History  Administered Date(s) Administered   Fluad Quad(high Dose 65+)  03/04/2019, 03/09/2020, 12/23/2020   Influenza Split 02/10/2012   Influenza, High Dose Seasonal PF 02/15/2017, 02/02/2018, 01/31/2022, 02/04/2023   Influenza-Unspecified 01/13/2015, 02/01/2016   Moderna Covid-19 Vaccine Bivalent Booster 72yrs & up 02/04/2023   PFIZER(Purple Top)SARS-COV-2 Vaccination 06/07/2019, 07/02/2019, 02/08/2020   Pfizer Covid-19 Vaccine Bivalent Booster 40yrs & up 01/07/2021   Pneumococcal Conjugate-13 02/25/2016   Pneumococcal Polysaccharide-23 05/04/2007, 03/01/2017   Td 06/17/1997   Tdap 05/04/2007, 12/17/2012, 09/27/2023   Unspecified SARS-COV-2 Vaccination 01/31/2022   Zoster Recombinant(Shingrix) 08/02/2023   Zoster, Live 12/30/2013   Pertinent  Health Maintenance Due  Topic Date Due   HEMOGLOBIN A1C  09/26/2023   INFLUENZA VACCINE  11/10/2023   OPHTHALMOLOGY EXAM  11/10/2023   FOOT EXAM  03/27/2024   Colonoscopy  03/27/2024   MAMMOGRAM  02/12/2025   DEXA SCAN  Completed      03/24/2022    8:53 AM 09/26/2022    8:57 AM 03/28/2023    8:41 AM 04/20/2023    9:36 AM 09/27/2023    8:46 AM  Fall Risk  Falls in the past year? 0 0 0 0 0  Was there an  injury with Fall? 0 0 0 0 0  Fall Risk Category Calculator 0 0 0 0 0  Fall Risk Category (Retired) Low       (RETIRED) Patient Fall Risk Level Low fall risk       Patient at Risk for Falls Due to No Fall Risks No Fall Risks  No Fall Risks No Fall Risks  Fall risk Follow up Falls evaluation completed  Falls evaluation completed  Falls evaluation completed Falls evaluation completed     Data saved with a previous flowsheet row definition   Functional Status Survey:    Vitals:   09/27/23 0848  BP: 122/76  Pulse: 84  Resp: 19  Temp: 97.8 F (36.6 C)  SpO2: 99%  Weight: 96 lb 3.2 oz (43.6 kg)  Height: 5' 3 (1.6 m)   Body mass index is 17.04 kg/m. Physical Exam Physical Exam   VITALS: T- 97.8, P- 84, BP- 122/76, SaO2- 99% MEASUREMENTS: Weight- 96.2. GENERAL: Alert, cooperative, well developed,  no acute distress HEENT: Normocephalic, normal oropharynx, moist mucous membranes, normal tympanic membranes bilaterally, vision grossly intact, nose normal NECK: Supple neck, no tenderness CHEST: Clear to auscultation bilaterally, no wheezes, rhonchi, or crackles CARDIOVASCULAR: Normal heart rate and rhythm, S1 and S2 normal without murmurs ABDOMEN: Soft, non-tender, non-distended, without organomegaly, normal bowel sounds MSK: Arthritic changes on Joints of all Fingers.bilateral knee crepitus  EXTREMITIES: No cyanosis or edema NEUROLOGICAL: Cranial nerves grossly intact, moves all extremities without gross motor or sensory deficit, sensation intact in feet bilaterally SKIN: No rash,no lesion or erythema   PSYCHIATRY/BEHAVIORAL: Mood stable    Labs reviewed: Recent Labs    03/28/23 0933  NA 139  K 4.4  CL 100  CO2 31  GLUCOSE 91  BUN 21  CREATININE 1.15*  CALCIUM  10.4   Recent Labs    03/28/23 0933  AST 25  ALT 14  BILITOT 0.4  PROT 7.4   Recent Labs    03/28/23 0933  WBC 7.0  NEUTROABS 4,606  HGB 12.8  HCT 39.1  MCV 92.0  PLT 338   Lab Results  Component Value Date   TSH 1.57 03/28/2023   Lab Results  Component Value Date   HGBA1C 6.0 (H) 03/28/2023   Lab Results  Component Value Date   CHOL 173 03/28/2023   HDL 74 03/28/2023   LDLCALC 86 03/28/2023   TRIG 54 03/28/2023   CHOLHDL 2.3 03/28/2023    Significant Diagnostic Results in last 30 days:  No results found.  Assessment/Plan  Type 2 Diabetes Mellitus A1c increased from 5.5 to 6.0 over the past year. Blood glucose levels at home are well-controlled, with recent readings of 98 and 91. Emphasis on dietary modifications to control blood glucose, particularly reducing carbohydrate and sugar intake. She is advised to watch portion sizes, especially for carbohydrates and starchy foods. - Recheck A1c to assess blood glucose control over the past three months - Continue dietary modifications to  reduce carbohydrate and sugar intake - Encourage portion control, especially for carbohydrates  Chronic Kidney Disease stage 3  Mild increase in creatinine from 1.0 to 1.15 over six months, likely due to diabetes and hypertension. No proteinuria in the last urine test, indicating no significant kidney damage. Emphasis on controlling blood pressure and blood glucose to prevent further kidney damage. She is well-hydrated and has been avoiding sugary foods, which is beneficial for kidney health. - Recheck kidney function with comprehensive metabolic panel - Monitor blood pressure and blood glucose levels  regularly  Hypertension Blood pressure is well-controlled at 122/76. Hypertension is a contributing factor to kidney function decline. Regular monitoring is essential to prevent further complications. - Continue current antihypertensive medication regimen - Regular monitoring of blood pressure  Osteoarthritis Osteoarthritis affecting finger joints, with no significant stiffness reported due to regular exercise. Family history of arthritis noted. She is encouraged to continue exercises to maintain joint mobility and use stress balls for finger joint exercise. - Continue regular exercise to maintain joint mobility - Use stress balls to exercise finger joints  Hyperlipidemia Cholesterol levels were previously optimal. No current issues reported. Regular monitoring is advised to maintain optimal levels. - Recheck lipid panel to monitor cholesterol levels  Hypothyroidism Currently managed with levothyroxine . No issues reported. Regular monitoring of thyroid  function is advised. - Recheck TSH to monitor thyroid  function  General Health Maintenance She is up to date on immunizations except for the shingles vaccine. Regular exercise and dietary modifications are emphasized for overall health maintenance. She is advised to obtain the shingles vaccine at the pharmacy. - Obtain shingles vaccine at the  pharmacy - Continue regular exercise and dietary modifications  Follow-up Follow-up plans discussed for lab results and routine appointments. She will be contacted by covering nurse practitioners for lab results. - Schedule follow-up appointment with ophthalmologist in August - Follow up with podiatrist every four months - Review lab results with covering nurse practitioners   Family/ staff Communication: Reviewed plan of care with patient verbalized understanding   Labs/tests ordered: - CBC with Differential/Platelet - CMP with eGFR([facility]) - TSH - Hgb A1C - Lipid panel   Next Appointment : Return in about 6 months (around 03/28/2024) for medical mangement of chronic issues.Aaron Aas   Spent 30 minutes of Face to face and non-face to face with patient  >50% time spent counseling; reviewing medical record; tests; labs; documentation and developing future plan of care.   Estil Heman, NP

## 2023-09-28 ENCOUNTER — Ambulatory Visit: Payer: Self-pay | Admitting: Nurse Practitioner

## 2023-09-28 LAB — COMPLETE METABOLIC PANEL WITHOUT GFR
AG Ratio: 1.4 (calc) (ref 1.0–2.5)
ALT: 11 U/L (ref 6–29)
AST: 20 U/L (ref 10–35)
Albumin: 4.3 g/dL (ref 3.6–5.1)
Alkaline phosphatase (APISO): 48 U/L (ref 37–153)
BUN/Creatinine Ratio: 22 (calc) (ref 6–22)
BUN: 26 mg/dL — ABNORMAL HIGH (ref 7–25)
CO2: 29 mmol/L (ref 20–32)
Calcium: 10.4 mg/dL (ref 8.6–10.4)
Chloride: 99 mmol/L (ref 98–110)
Creat: 1.16 mg/dL — ABNORMAL HIGH (ref 0.60–1.00)
Globulin: 3.1 g/dL (ref 1.9–3.7)
Glucose, Bld: 94 mg/dL (ref 65–99)
Potassium: 4.2 mmol/L (ref 3.5–5.3)
Sodium: 137 mmol/L (ref 135–146)
Total Bilirubin: 0.4 mg/dL (ref 0.2–1.2)
Total Protein: 7.4 g/dL (ref 6.1–8.1)

## 2023-09-28 LAB — CBC WITH DIFFERENTIAL/PLATELET
Absolute Lymphocytes: 1994 {cells}/uL (ref 850–3900)
Absolute Monocytes: 353 {cells}/uL (ref 200–950)
Basophils Absolute: 43 {cells}/uL (ref 0–200)
Basophils Relative: 0.6 %
Eosinophils Absolute: 122 {cells}/uL (ref 15–500)
Eosinophils Relative: 1.7 %
HCT: 39.2 % (ref 35.0–45.0)
Hemoglobin: 12.6 g/dL (ref 11.7–15.5)
MCH: 29.9 pg (ref 27.0–33.0)
MCHC: 32.1 g/dL (ref 32.0–36.0)
MCV: 93.1 fL (ref 80.0–100.0)
MPV: 9.5 fL (ref 7.5–12.5)
Monocytes Relative: 4.9 %
Neutro Abs: 4687 {cells}/uL (ref 1500–7800)
Neutrophils Relative %: 65.1 %
Platelets: 314 10*3/uL (ref 140–400)
RBC: 4.21 10*6/uL (ref 3.80–5.10)
RDW: 13 % (ref 11.0–15.0)
Total Lymphocyte: 27.7 %
WBC: 7.2 10*3/uL (ref 3.8–10.8)

## 2023-09-28 LAB — LIPID PANEL
Cholesterol: 172 mg/dL (ref <200)
HDL: 77 mg/dL (ref >=50)
LDL Cholesterol (Calc): 82 mg/dL
Non-HDL Cholesterol (Calc): 95 mg/dL (ref <130)
Total CHOL/HDL Ratio: 2.2 (calc) (ref <5.0)
Triglycerides: 53 mg/dL (ref <150)

## 2023-09-28 LAB — HEMOGLOBIN A1C
Hgb A1c MFr Bld: 6.1 % — ABNORMAL HIGH (ref <5.7)
Mean Plasma Glucose: 128 mg/dL
eAG (mmol/L): 7.1 mmol/L

## 2023-09-28 LAB — TSH: TSH: 1.9 m[IU]/L (ref 0.40–4.50)

## 2023-10-13 ENCOUNTER — Other Ambulatory Visit: Payer: Self-pay | Admitting: Family

## 2023-10-13 DIAGNOSIS — I10 Essential (primary) hypertension: Secondary | ICD-10-CM

## 2023-11-07 ENCOUNTER — Other Ambulatory Visit: Payer: Self-pay | Admitting: Family

## 2023-11-07 DIAGNOSIS — E7800 Pure hypercholesterolemia, unspecified: Secondary | ICD-10-CM

## 2023-11-13 ENCOUNTER — Other Ambulatory Visit: Payer: Self-pay | Admitting: Family

## 2023-11-13 DIAGNOSIS — E039 Hypothyroidism, unspecified: Secondary | ICD-10-CM

## 2023-11-15 DIAGNOSIS — H25813 Combined forms of age-related cataract, bilateral: Secondary | ICD-10-CM | POA: Diagnosis not present

## 2023-11-15 DIAGNOSIS — E119 Type 2 diabetes mellitus without complications: Secondary | ICD-10-CM | POA: Diagnosis not present

## 2023-11-15 DIAGNOSIS — H40013 Open angle with borderline findings, low risk, bilateral: Secondary | ICD-10-CM | POA: Diagnosis not present

## 2023-11-15 DIAGNOSIS — H524 Presbyopia: Secondary | ICD-10-CM | POA: Diagnosis not present

## 2023-11-15 LAB — HM DIABETES EYE EXAM

## 2023-12-15 ENCOUNTER — Other Ambulatory Visit: Payer: Self-pay | Admitting: Family

## 2023-12-15 MED ORDER — METFORMIN HCL 500 MG PO TABS: 500.0000 mg | ORAL_TABLET | Freq: Two times a day (BID) | ORAL | 1 refills | Status: AC

## 2023-12-15 NOTE — Telephone Encounter (Signed)
 Copied from CRM [id number]. Topic: Clinical - Medication Refill >> Dec 15, 2023  1:00 PM Mercer PEDLAR wrote: Medication: metFORMIN  (GLUCOPHAGE ) 500 MG tablet  Has the patient contacted their pharmacy? Yes (Agent: If no, request that the patient contact the pharmacy for the refill. If patient does not wish to contact the pharmacy document the reason why and proceed with request.) (Agent: If yes, when and what did the pharmacy advise?)  This is the patient's preferred pharmacy:  [facility] [address] [place] - [address] [place] [address] Phone: [phone] Fax: [phone]  Is this the correct pharmacy for this prescription? Yes If no, delete pharmacy and type the correct one.   Has the prescription been filled recently? No  Is the patient out of the medication? No  Has the patient been seen for an appointment in the last year OR does the patient have an upcoming appointment? Yes  Can we respond through MyChart? No  Agent: Please be advised that Rx refills may take up to 3 business days. We ask that you follow-up with your pharmacy.

## 2024-01-08 ENCOUNTER — Ambulatory Visit: Admitting: Podiatry

## 2024-01-08 ENCOUNTER — Encounter: Payer: Self-pay | Admitting: Podiatry

## 2024-01-08 DIAGNOSIS — B351 Tinea unguium: Secondary | ICD-10-CM | POA: Diagnosis not present

## 2024-01-08 DIAGNOSIS — M79674 Pain in right toe(s): Secondary | ICD-10-CM

## 2024-01-08 DIAGNOSIS — M79675 Pain in left toe(s): Secondary | ICD-10-CM

## 2024-01-08 NOTE — Progress Notes (Unsigned)
    Subjective:  Patient ID: Sierra Combs, female    DOB: [birth year],  MRN: [id number]  Sierra Combs presents to clinic today for:  Chief Complaint  Patient presents with   Diabetes    4 month [facility]. NIDDM A1C 6.1. Toenail trim. 0 pain.   Patient notes nails are thick, discolored, elongated and painful in shoegear when trying to ambulate.    PCP is Ngetich, Roxan BROCKS, NP.  Past Medical History:  Diagnosis Date   Arthritis    Breast cyst 3cm L breast   Colonic polyp    Diabetes mellitus type 2    Diverticulosis    Fatty liver    H/O bone density study 2020   H/O mammogram 2022   Hyperlipidemia small particles 6/06   Hypertension    Menopause 04/11/2000   Osteopenia L hip   Unspecified hypothyroidism 09/09/2010   Past Surgical History:  Procedure Laterality Date   BREAST BIOPSY  7/06 L breast benign   CESAREAN SECTION     COLONOSCOPY  6/05, 09/2008, 11/2013   Dr. Lennard   No Known Allergies  Review of Systems: Negative except as noted in the HPI.  Objective:  [name] is a pleasant 73 y.o. female in NAD. AAO x 3.  Vascular Examination: Capillary refill time is 3-5 seconds to toes bilateral. Palpable pedal pulses b/l LE. Digital hair present b/l.  Skin temperature gradient WNL b/l. No varicosities b/l. No cyanosis noted b/l.   Dermatological Examination: Pedal skin with normal turgor, texture and tone b/l. No open wounds. No interdigital macerations b/l. Toenails x10 are 3mm thick, discolored, dystrophic with subungual debris. There is pain with compression of the nail plates.  They are elongated x10     Latest Ref Rng & Units 09/27/2023    9:29 AM 03/28/2023    9:33 AM  Hemoglobin A1C  Hemoglobin-A1c <5.7 % 6.1  6.0     Assessment/Plan: 1. Pain due to onychomycosis of toenails of both feet    The mycotic toenails were sharply debrided x10 with sterile nail nippers and a power debriding burr to decrease bulk/thickness and length.    Return in about 3  months (around 04/08/2024) for [facility].   [name] Imperial, DPM, FACFAS [facility]     [address] [place], [place] [address]                Office [phone]  Fax [phone]

## 2024-02-02 ENCOUNTER — Other Ambulatory Visit: Payer: Self-pay | Admitting: Family

## 2024-02-02 DIAGNOSIS — E7800 Pure hypercholesterolemia, unspecified: Secondary | ICD-10-CM

## 2024-02-06 NOTE — Progress Notes (Signed)
 Sierra Combs                                          MRN: [id number]   02/06/2024   The [facility] Quality Team Specialist reviewed this patient medical record for the purposes of chart review for care gap closure. The following were reviewed: chart review for care gap closure-kidney health evaluation for diabetes:eGFR  and uACR.    [facility]BCI Quality Team

## 2024-02-19 LAB — HM MAMMOGRAPHY

## 2024-02-20 ENCOUNTER — Encounter: Payer: Self-pay | Admitting: Family

## 2024-03-26 ENCOUNTER — Ambulatory Visit: Payer: PRIVATE HEALTH INSURANCE | Admitting: Family

## 2024-03-26 ENCOUNTER — Encounter: Payer: Self-pay | Admitting: Family

## 2024-03-26 VITALS — BP 118/76 | HR 74 | Temp 97.6°F | Resp 18 | Ht 63.0 in | Wt 90.2 lb

## 2024-03-26 DIAGNOSIS — M150 Primary generalized (osteo)arthritis: Secondary | ICD-10-CM

## 2024-03-26 DIAGNOSIS — E785 Hyperlipidemia, unspecified: Secondary | ICD-10-CM

## 2024-03-26 DIAGNOSIS — E1165 Type 2 diabetes mellitus with hyperglycemia: Secondary | ICD-10-CM

## 2024-03-26 DIAGNOSIS — N1831 Chronic kidney disease, stage 3a: Secondary | ICD-10-CM

## 2024-03-26 DIAGNOSIS — E039 Hypothyroidism, unspecified: Secondary | ICD-10-CM

## 2024-03-26 DIAGNOSIS — I10 Essential (primary) hypertension: Secondary | ICD-10-CM
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
## 2024-03-26 NOTE — Patient Instructions (Signed)
 Increase protein in the diet or drink protein supplement like Glucerna  least once a day to prevent weight loss

## 2024-03-27 LAB — COMPREHENSIVE METABOLIC PANEL WITH GFR
AG Ratio: 1.4 (calc) (ref 1.0–2.5)
ALT: 15 U/L (ref 6–29)
AST: 24 U/L (ref 10–35)
Albumin: 4.1 g/dL (ref 3.6–5.1)
Alkaline phosphatase (APISO): 52 U/L (ref 37–153)
BUN: 22 mg/dL (ref 7–25)
CO2: 29 mmol/L (ref 20–32)
Calcium: 9.7 mg/dL (ref 8.6–10.4)
Chloride: 103 mmol/L (ref 98–110)
Creat: 0.96 mg/dL (ref 0.60–1.00)
Globulin: 3 g/dL (ref 1.9–3.7)
Glucose, Bld: 81 mg/dL (ref 65–99)
Potassium: 4.3 mmol/L (ref 3.5–5.3)
Sodium: 139 mmol/L (ref 135–146)
Total Bilirubin: 0.4 mg/dL (ref 0.2–1.2)
Total Protein: 7.1 g/dL (ref 6.1–8.1)
eGFR: 62 mL/min/1.73m2 (ref >=60)

## 2024-03-27 LAB — CBC WITH DIFFERENTIAL/PLATELET
Absolute Lymphocytes: 1636 {cells}/uL (ref 850–3900)
Absolute Monocytes: 336 {cells}/uL (ref 200–950)
Basophils Absolute: 41 {cells}/uL (ref 0–200)
Basophils Relative: 0.7 %
Eosinophils Absolute: 29 {cells}/uL (ref 15–500)
Eosinophils Relative: 0.5 %
HCT: 36.6 % (ref 35.9–46.0)
Hemoglobin: 11.8 g/dL (ref 11.7–15.5)
MCH: 29.7 pg (ref 27.0–33.0)
MCHC: 32.2 g/dL (ref 31.6–35.4)
MCV: 92.2 fL (ref 81.4–101.7)
MPV: 9.5 fL (ref 7.5–12.5)
Monocytes Relative: 5.8 %
Neutro Abs: 3758 {cells}/uL (ref 1500–7800)
Neutrophils Relative %: 64.8 %
Platelets: 319 Thousand/uL (ref 140–400)
RBC: 3.97 Million/uL (ref 3.80–5.10)
RDW: 12.7 % (ref 11.0–15.0)
Total Lymphocyte: 28.2 %
WBC: 5.8 Thousand/uL (ref 3.8–10.8)

## 2024-03-27 LAB — MICROALBUMIN / CREATININE URINE RATIO
Creatinine, Urine: 29 mg/dL (ref 20–275)
Microalb, Ur: <0.2 mg/dL

## 2024-03-27 LAB — HEMOGLOBIN A1C
Hgb A1c MFr Bld: 5.7 % — ABNORMAL HIGH (ref <5.7)
Mean Plasma Glucose: 117 mg/dL
eAG (mmol/L): 6.5 mmol/L

## 2024-03-27 LAB — LIPID PANEL
Cholesterol: 146 mg/dL (ref <200)
HDL: 68 mg/dL (ref >=50)
LDL Cholesterol (Calc): 65 mg/dL
Non-HDL Cholesterol (Calc): 78 mg/dL (ref <130)
Total CHOL/HDL Ratio: 2.1 (calc) (ref <5.0)
Triglycerides: 44 mg/dL (ref <150)

## 2024-03-27 LAB — TSH: TSH: 1.06 m[IU]/L (ref 0.40–4.50)

## 2024-03-28 ENCOUNTER — Ambulatory Visit: Payer: Self-pay | Admitting: Family

## 2024-04-08 ENCOUNTER — Encounter: Payer: Self-pay | Admitting: Podiatry

## 2024-04-08 ENCOUNTER — Ambulatory Visit: Admitting: Podiatry

## 2024-04-08 DIAGNOSIS — M79674 Pain in right toe(s): Secondary | ICD-10-CM | POA: Diagnosis not present

## 2024-04-08 DIAGNOSIS — B351 Tinea unguium: Secondary | ICD-10-CM

## 2024-04-08 DIAGNOSIS — M2012 Hallux valgus (acquired), left foot: Secondary | ICD-10-CM

## 2024-04-08 DIAGNOSIS — M79675 Pain in left toe(s): Secondary | ICD-10-CM

## 2024-04-08 NOTE — Progress Notes (Signed)
"   °  °  Subjective:  Patient ID: Sierra Combs, female    DOB: [birth year],  MRN: [id number]  Sierra Combs presents to clinic today for:  Chief Complaint  Patient presents with   Diabetes    [facility] NIDDM A1C 5.7. Toenail trim.   Patient notes nails are thick, discolored, elongated and painful in shoegear when trying to ambulate.    She also has secondary concern of the left great toe pushing against the second toe.  She would like to continue with toe spacers.  She currently uses a foam toe spacer and is requesting additional ones today.  PCP is Sierra Combs, Sierra BROCKS, Sierra Combs.  Past Medical History:  Diagnosis Date   Arthritis    Breast cyst 3cm L breast   Colonic polyp    Diabetes mellitus type 2    Diverticulosis    Fatty liver    H/O bone density study 2020   H/O mammogram 2022   Hyperlipidemia small particles 6/06   Hypertension    Menopause 04/11/2000   Osteopenia L hip   Unspecified hypothyroidism 09/09/2010   Past Surgical History:  Procedure Laterality Date   BREAST BIOPSY  7/06 L breast benign   CESAREAN SECTION     COLONOSCOPY  6/05, 09/2008, 11/2013   Dr. Lennard   No Known Allergies  Review of Systems: Negative except as noted in the HPI.  Objective:  Sierra Combs is a pleasant 73 y.o. female in NAD. AAO x 3.  Vascular Examination: Capillary refill time is 3-5 seconds to toes bilateral. Palpable pedal pulses b/l LE. Digital hair present b/l.  Skin temperature gradient WNL b/l. No varicosities b/l. No cyanosis noted b/l.   Dermatological Examination: Pedal skin with normal turgor, texture and tone b/l. No open wounds. No interdigital macerations b/l. Toenails x10 are 3mm thick, discolored, dystrophic with subungual debris. There is pain with compression of the nail plates.  They are elongated x10  Orthopedic examination: There is lateral angulation of the left great toe with bony prominence on the medial first MPJ consistent with hallux valgus and bunion deformity.   This is manually reducible with manipulation.  No interdigital corns are noted.     Latest Ref Rng & Units 03/26/2024   10:10 AM 09/27/2023    9:29 AM  Hemoglobin A1C  Hemoglobin-A1c <5.7 % 5.7  6.1     Assessment/Plan: 1. Pain due to onychomycosis of toenails of both feet   2. Hallux valgus, left    The mycotic toenails were sharply debrided x10 with sterile nail nippers and a power debriding burr to decrease bulk/thickness and length.    She was given foam toe spacers for the first interspace.  If this does not continue to provide improvement and comfort, she can consider bunion correction in the future.  This was briefly discussed today.  Return in about 3 months (around 07/07/2024) for [facility].   Sierra Combs, DPM, FACFAS [facility]     [address] [place], [place] [address]                Office [phone]  Fax [phone] "

## 2024-04-13 ENCOUNTER — Other Ambulatory Visit: Payer: Self-pay | Admitting: Family

## 2024-04-13 DIAGNOSIS — I10 Essential (primary) hypertension: Secondary | ICD-10-CM

## 2024-04-23 ENCOUNTER — Ambulatory Visit (INDEPENDENT_AMBULATORY_CARE_PROVIDER_SITE_OTHER): Payer: PRIVATE HEALTH INSURANCE | Admitting: Family

## 2024-04-23 ENCOUNTER — Encounter: Payer: Self-pay | Admitting: Family

## 2024-04-23 VITALS — BP 122/76 | Temp 97.8°F | Resp 18 | Ht 63.0 in | Wt 93.2 lb

## 2024-04-23 DIAGNOSIS — Z0000 Encounter for general adult medical examination without abnormal findings: Secondary | ICD-10-CM | POA: Diagnosis not present

## 2024-04-23 NOTE — Progress Notes (Signed)
 "  Chief Complaint  Patient presents with   Medicare Wellness    Annual Wellness Visit.     Subjective:   Sierra Combs is a 74 y.o. female who presents for a Medicare Annual Wellness Visit.  Visit info / Clinical Intake: Medicare Wellness Visit Type:: Initial Annual Wellness Visit Persons participating in visit and providing information:: patient Medicare Wellness Visit Mode:: In-person (required for WTM) Interpreter Needed?: No Pre-visit prep was completed: yes AWV questionnaire completed by patient prior to visit?: no Living arrangements:: lives with spouse/significant other Patient's Overall Health Status Rating: good Typical amount of pain: none Does pain affect daily life?: no Are you currently prescribed opioids?: no  Dietary Habits and Nutritional Risks How many meals a day?: 3 Eats fruit and vegetables daily?: yes Most meals are obtained by: preparing own meals In the last 2 weeks, have you had any of the following?: none Diabetic:: (!) yes Any non-healing wounds?: no How often do you check your BS?: continuous glucose monitor; 3 Would you like to be referred to a Nutritionist or for Diabetic Management? : no  Fall Screening Falls in the past year?: 0 Number of falls in past year: 0 Was there an injury with Fall?: 0 Fall Risk Category Calculator: 0 Patient Fall Risk Level: Low Fall Risk  Fall Risk Patient at Risk for Falls Due to: No Fall Risks Fall risk Follow up: Falls evaluation completed  Cognitive Assessment Difficulty concentrating, remembering, or making decisions? : no Will 6CIT or Mini Cog be Completed: yes What year is it?: 0 points What month is it?: 0 points Give patient an address phrase to remember (5 components): [address] [facility] [place]  About what time is it?: 0 points Count backwards from 20 to 1: 0 points Say the months of the year in reverse: 0 points Repeat the address phrase from earlier: 2 points 6 CIT Score: 2  points  Advance Directives (For Healthcare) Does Patient Have a Medical Advance Directive?: No Does patient want to make changes to medical advance directive?: No - Patient declined Would patient like information on creating a medical advance directive?: No - Patient declined    Allergies (verified) Patient has no known allergies.   Current Medications (verified) Outpatient Encounter Medications as of 04/23/2024  Medication Sig   aspirin EC 81 MG tablet Take 81 mg by mouth daily. Swallow whole.   atorvastatin  (LIPITOR) 80 MG tablet TAKE 1 TABLET(80 MG) BY MOUTH DAILY   Calcium  Carbonate-Vitamin D  (CALCIUM  600 + D PO) Take 1 tablet by mouth 2 (two) times daily.   fish oil-omega-3 fatty acids 1000 MG capsule Take 1 g by mouth daily.   levothyroxine  (SYNTHROID ) 75 MCG tablet TAKE 1 TABLET(75 MCG) BY MOUTH DAILY BEFORE BREAKFAST   lisinopril  (ZESTRIL ) 20 MG tablet TAKE 1 TABLET(20 MG) BY MOUTH DAILY   metFORMIN  (GLUCOPHAGE ) 500 MG tablet Take 1 tablet (500 mg total) by mouth 2 (two) times daily with a meal.   Multiple Vitamins-Minerals (MULTIVITAMIN WITH MINERALS) tablet Take 1 tablet by mouth daily.   No facility-administered encounter medications on file as of 04/23/2024.    History: Past Medical History:  Diagnosis Date   Arthritis    Breast cyst 3cm L breast   Colonic polyp    Diabetes mellitus type 2    Diverticulosis    Fatty liver    H/O bone density study 2020   H/O mammogram 2022   Hyperlipidemia small particles 6/06   Hypertension    Menopause  04/11/2000   Osteopenia L hip   Unspecified hypothyroidism 09/09/2010   Past Surgical History:  Procedure Laterality Date   BREAST BIOPSY  7/06 L breast benign   CESAREAN SECTION     COLONOSCOPY  6/05, 09/2008, 11/2013   Dr. Lennard   Family History  Problem Relation Age of Onset   Diabetes Mother    Heart disease Mother    Hyperlipidemia Mother    Hypertension Mother    Diabetes Father    Heart disease Father     Hyperlipidemia Father    Hypertension Father    Diabetes Sister    Diabetes Sister    Lung cancer Sister        not smoker; +exposure from husband; works in naval architect   Cancer Sister        lung, stage 4   Diabetes Brother        legs amputated   Kidney disease Brother        on dialysis   Social History   Occupational History   Occupation: Equities Trader: [employer]  Tobacco Use   Smoking status: Never   Smokeless tobacco: Never   Tobacco comments:    passive tobacco exposure from husband  Vaping Use   Vaping status: Never Used  Substance and Sexual Activity   Alcohol use: No   Drug use: No   Sexual activity: Yes    Partners: Male   Tobacco Counseling Counseling given: Not Answered Tobacco comments: passive tobacco exposure from husband  SDOH Screenings   Food Insecurity: No Food Insecurity (04/23/2024)  Housing: Unknown (04/23/2024)  Transportation Needs: No Transportation Needs (04/23/2024)  Utilities: Not At Risk (04/23/2024)  Depression (PHQ2-9): Low Risk (04/23/2024)  Tobacco Use: Low Risk (04/23/2024)   See flowsheets for full screening details  Depression Screen PHQ 2 & 9 Depression Scale- Over the past 2 weeks, how often have you been bothered by any of the following problems? Little interest or pleasure in doing things: 0 Feeling down, depressed, or hopeless (PHQ Adolescent also includes...irritable): 0 PHQ-2 Total Score: 0     Goals Addressed   None          Objective:    Today's Vitals   04/23/24 0912  BP: 122/76  Resp: 18  Temp: 97.8 F (36.6 C)  SpO2: 98%  Weight: 93 lb 3.2 oz (42.3 kg)  Height: 5' 3 (1.6 m)   Body mass index is 16.51 kg/m.  Hearing/Vision screen Hearing Screening - Comments:: No hearing issues. Vision Screening - Comments:: Eye exam next month 05/20/2024 no issues [facility] Dr.Chaunhi Fleeta) Immunizations and Health Maintenance Health Maintenance  Topic Date Due    Colonoscopy  03/27/2024   Medicare Annual Wellness (AWV)  04/19/2024   FOOT EXAM  03/27/2024   COVID-19 Vaccine (8 - 2025-26 season) 07/15/2024   HEMOGLOBIN A1C  09/24/2024   OPHTHALMOLOGY EXAM  11/14/2024   Diabetic kidney evaluation - eGFR measurement  03/26/2025   Diabetic kidney evaluation - Urine ACR  03/26/2025   Mammogram  02/18/2026   DTaP/Tdap/Td (5 - Td or Tdap) 09/26/2033   Pneumococcal Vaccine: 50+ Years  Completed   Influenza Vaccine  Completed   Bone Density Scan  Completed   Hepatitis C Screening  Completed   Zoster Vaccines- Shingrix  Completed   Meningococcal B Vaccine  Aged Out        Assessment/Plan:  This is a routine wellness examination for Sierra Combs.  Patient Care Team:  Argie Applegate, Roxan BROCKS, NP as PCP - General (Family Medicine) [name], MD as Consulting Physician (Ophthalmology)  I have personally reviewed and noted the following in the patients chart:   Medical and social history Use of alcohol, tobacco or illicit drugs  Current medications and supplements including opioid prescriptions. Functional ability and status Nutritional status Physical activity Advanced directives List of other physicians Hospitalizations, surgeries, and ER visits in previous 12 months Vitals Screenings to include cognitive, depression, and falls Referrals and appointments  No orders of the defined types were placed in this encounter.  In addition, I have reviewed and discussed with patient certain preventive protocols, quality metrics, and best practice recommendations. A written personalized care plan for preventive services as well as general preventive health recommendations were provided to patient.   Roxan BROCKS Loris Winrow, NP   04/23/2024   No follow-ups on file.  After Visit Summary: (In Person-Printed) AVS printed and given to the patient  Nurse Notes: Has upcoming appointment for colonoscopy  "

## 2024-05-11 ENCOUNTER — Other Ambulatory Visit: Payer: Self-pay | Admitting: Family

## 2024-05-11 DIAGNOSIS — E039 Hypothyroidism, unspecified: Secondary | ICD-10-CM

## 2024-07-08 ENCOUNTER — Ambulatory Visit: Admitting: Podiatry

## 2024-09-25 ENCOUNTER — Ambulatory Visit: Payer: PRIVATE HEALTH INSURANCE | Admitting: Family

## 2025-04-25 ENCOUNTER — Ambulatory Visit: Payer: PRIVATE HEALTH INSURANCE | Admitting: Family
